# Patient Record
Sex: Male | Born: 1950
Health system: Southern US, Community
[De-identification: ages and names within clinical notes are randomized; demographics above are authoritative.]

## PROBLEM LIST (undated history)

## (undated) DIAGNOSIS — L111 Transient acantholytic dermatosis [Grover]: Secondary | ICD-10-CM

## (undated) DIAGNOSIS — I2510 Atherosclerotic heart disease of native coronary artery without angina pectoris: Secondary | ICD-10-CM

## (undated) DIAGNOSIS — J302 Other seasonal allergic rhinitis: Secondary | ICD-10-CM

## (undated) DIAGNOSIS — I639 Cerebral infarction, unspecified: Secondary | ICD-10-CM

## (undated) DIAGNOSIS — E559 Vitamin D deficiency, unspecified: Secondary | ICD-10-CM

## (undated) DIAGNOSIS — I10 Essential (primary) hypertension: Secondary | ICD-10-CM

## (undated) DIAGNOSIS — I2119 ST elevation (STEMI) myocardial infarction involving other coronary artery of inferior wall: Secondary | ICD-10-CM

## (undated) DIAGNOSIS — D751 Secondary polycythemia: Secondary | ICD-10-CM

## (undated) DIAGNOSIS — Z21 Asymptomatic human immunodeficiency virus [HIV] infection status: Secondary | ICD-10-CM

## (undated) DIAGNOSIS — B20 Human immunodeficiency virus [HIV] disease: Secondary | ICD-10-CM

## (undated) DIAGNOSIS — N189 Chronic kidney disease, unspecified: Secondary | ICD-10-CM

## (undated) DIAGNOSIS — J449 Chronic obstructive pulmonary disease, unspecified: Secondary | ICD-10-CM

## (undated) HISTORY — DX: Chronic obstructive pulmonary disease, unspecified: J44.9

## (undated) HISTORY — DX: Essential (primary) hypertension: I10

## (undated) HISTORY — DX: Vitamin D deficiency, unspecified: E55.9

## (undated) HISTORY — DX: Atherosclerotic heart disease of native coronary artery without angina pectoris: I25.10

## (undated) HISTORY — DX: ST elevation (STEMI) myocardial infarction involving other coronary artery of inferior wall: I21.19

---

## 2015-02-21 DIAGNOSIS — J302 Other seasonal allergic rhinitis: Secondary | ICD-10-CM

## 2015-02-21 DIAGNOSIS — L111 Transient acantholytic dermatosis [Grover]: Secondary | ICD-10-CM

## 2015-02-21 DIAGNOSIS — D751 Secondary polycythemia: Secondary | ICD-10-CM

## 2015-02-21 HISTORY — DX: Transient acantholytic dermatosis (grover): L11.1

## 2015-02-21 HISTORY — DX: Secondary polycythemia: D75.1

## 2015-02-21 HISTORY — DX: Other seasonal allergic rhinitis: J30.2

## 2015-04-19 DIAGNOSIS — R300 Dysuria: Secondary | ICD-10-CM | POA: Diagnosis not present

## 2015-04-19 DIAGNOSIS — B20 Human immunodeficiency virus [HIV] disease: Secondary | ICD-10-CM | POA: Diagnosis not present

## 2015-04-23 DIAGNOSIS — B20 Human immunodeficiency virus [HIV] disease: Secondary | ICD-10-CM | POA: Diagnosis not present

## 2015-05-04 DIAGNOSIS — D751 Secondary polycythemia: Secondary | ICD-10-CM | POA: Diagnosis not present

## 2015-06-02 DIAGNOSIS — D485 Neoplasm of uncertain behavior of skin: Secondary | ICD-10-CM | POA: Diagnosis not present

## 2015-06-02 DIAGNOSIS — D692 Other nonthrombocytopenic purpura: Secondary | ICD-10-CM | POA: Diagnosis not present

## 2015-06-02 DIAGNOSIS — L820 Inflamed seborrheic keratosis: Secondary | ICD-10-CM | POA: Diagnosis not present

## 2015-06-02 DIAGNOSIS — L578 Other skin changes due to chronic exposure to nonionizing radiation: Secondary | ICD-10-CM | POA: Diagnosis not present

## 2015-06-02 DIAGNOSIS — L821 Other seborrheic keratosis: Secondary | ICD-10-CM | POA: Diagnosis not present

## 2015-06-08 DIAGNOSIS — L821 Other seborrheic keratosis: Secondary | ICD-10-CM | POA: Diagnosis not present

## 2015-06-08 DIAGNOSIS — G894 Chronic pain syndrome: Secondary | ICD-10-CM | POA: Diagnosis not present

## 2015-06-08 DIAGNOSIS — B20 Human immunodeficiency virus [HIV] disease: Secondary | ICD-10-CM | POA: Diagnosis not present

## 2015-06-08 DIAGNOSIS — J449 Chronic obstructive pulmonary disease, unspecified: Secondary | ICD-10-CM | POA: Diagnosis not present

## 2015-06-08 DIAGNOSIS — H269 Unspecified cataract: Secondary | ICD-10-CM | POA: Diagnosis not present

## 2015-06-08 DIAGNOSIS — E079 Disorder of thyroid, unspecified: Secondary | ICD-10-CM | POA: Diagnosis not present

## 2015-06-28 DIAGNOSIS — H2513 Age-related nuclear cataract, bilateral: Secondary | ICD-10-CM | POA: Diagnosis not present

## 2015-06-28 DIAGNOSIS — H52203 Unspecified astigmatism, bilateral: Secondary | ICD-10-CM | POA: Diagnosis not present

## 2015-06-28 DIAGNOSIS — B20 Human immunodeficiency virus [HIV] disease: Secondary | ICD-10-CM | POA: Diagnosis not present

## 2015-06-28 DIAGNOSIS — H2511 Age-related nuclear cataract, right eye: Secondary | ICD-10-CM | POA: Diagnosis not present

## 2015-06-28 DIAGNOSIS — H5213 Myopia, bilateral: Secondary | ICD-10-CM | POA: Diagnosis not present

## 2015-06-28 DIAGNOSIS — H16002 Unspecified corneal ulcer, left eye: Secondary | ICD-10-CM | POA: Diagnosis not present

## 2015-06-28 DIAGNOSIS — H2512 Age-related nuclear cataract, left eye: Secondary | ICD-10-CM | POA: Diagnosis not present

## 2015-06-28 DIAGNOSIS — H524 Presbyopia: Secondary | ICD-10-CM | POA: Diagnosis not present

## 2015-06-28 DIAGNOSIS — H52223 Regular astigmatism, bilateral: Secondary | ICD-10-CM | POA: Diagnosis not present

## 2015-06-28 DIAGNOSIS — H5203 Hypermetropia, bilateral: Secondary | ICD-10-CM | POA: Diagnosis not present

## 2015-07-08 DIAGNOSIS — F17210 Nicotine dependence, cigarettes, uncomplicated: Secondary | ICD-10-CM | POA: Diagnosis not present

## 2015-07-08 DIAGNOSIS — Z7982 Long term (current) use of aspirin: Secondary | ICD-10-CM | POA: Diagnosis not present

## 2015-07-08 DIAGNOSIS — J449 Chronic obstructive pulmonary disease, unspecified: Secondary | ICD-10-CM | POA: Diagnosis not present

## 2015-07-08 DIAGNOSIS — H2512 Age-related nuclear cataract, left eye: Secondary | ICD-10-CM | POA: Diagnosis not present

## 2015-07-08 DIAGNOSIS — B20 Human immunodeficiency virus [HIV] disease: Secondary | ICD-10-CM | POA: Diagnosis not present

## 2015-07-08 DIAGNOSIS — I10 Essential (primary) hypertension: Secondary | ICD-10-CM | POA: Diagnosis not present

## 2015-07-28 DIAGNOSIS — H2511 Age-related nuclear cataract, right eye: Secondary | ICD-10-CM | POA: Diagnosis not present

## 2015-07-28 DIAGNOSIS — F17210 Nicotine dependence, cigarettes, uncomplicated: Secondary | ICD-10-CM | POA: Diagnosis not present

## 2015-07-28 DIAGNOSIS — Z7982 Long term (current) use of aspirin: Secondary | ICD-10-CM | POA: Diagnosis not present

## 2015-07-28 DIAGNOSIS — B20 Human immunodeficiency virus [HIV] disease: Secondary | ICD-10-CM | POA: Diagnosis not present

## 2015-07-28 DIAGNOSIS — I10 Essential (primary) hypertension: Secondary | ICD-10-CM | POA: Diagnosis not present

## 2015-07-28 DIAGNOSIS — J449 Chronic obstructive pulmonary disease, unspecified: Secondary | ICD-10-CM | POA: Diagnosis not present

## 2015-07-28 DIAGNOSIS — Z9842 Cataract extraction status, left eye: Secondary | ICD-10-CM | POA: Diagnosis not present

## 2015-08-30 DIAGNOSIS — B20 Human immunodeficiency virus [HIV] disease: Secondary | ICD-10-CM | POA: Diagnosis not present

## 2015-08-30 DIAGNOSIS — M899 Disorder of bone, unspecified: Secondary | ICD-10-CM | POA: Diagnosis not present

## 2015-09-03 DIAGNOSIS — B20 Human immunodeficiency virus [HIV] disease: Secondary | ICD-10-CM | POA: Diagnosis not present

## 2016-03-17 DIAGNOSIS — R42 Dizziness and giddiness: Secondary | ICD-10-CM | POA: Diagnosis not present

## 2016-03-17 DIAGNOSIS — J449 Chronic obstructive pulmonary disease, unspecified: Secondary | ICD-10-CM | POA: Diagnosis not present

## 2016-03-17 DIAGNOSIS — S6292XA Unspecified fracture of left wrist and hand, initial encounter for closed fracture: Secondary | ICD-10-CM | POA: Diagnosis not present

## 2016-03-17 DIAGNOSIS — Z79899 Other long term (current) drug therapy: Secondary | ICD-10-CM | POA: Diagnosis not present

## 2016-03-17 DIAGNOSIS — S52592A Other fractures of lower end of left radius, initial encounter for closed fracture: Secondary | ICD-10-CM | POA: Diagnosis not present

## 2016-03-17 DIAGNOSIS — R55 Syncope and collapse: Secondary | ICD-10-CM | POA: Diagnosis not present

## 2016-03-17 DIAGNOSIS — I10 Essential (primary) hypertension: Secondary | ICD-10-CM | POA: Diagnosis not present

## 2016-03-17 DIAGNOSIS — Z87891 Personal history of nicotine dependence: Secondary | ICD-10-CM | POA: Diagnosis not present

## 2016-03-17 DIAGNOSIS — N390 Urinary tract infection, site not specified: Secondary | ICD-10-CM | POA: Diagnosis not present

## 2016-03-17 DIAGNOSIS — S52502A Unspecified fracture of the lower end of left radius, initial encounter for closed fracture: Secondary | ICD-10-CM | POA: Diagnosis not present

## 2016-03-17 DIAGNOSIS — S098XXA Other specified injuries of head, initial encounter: Secondary | ICD-10-CM | POA: Diagnosis not present

## 2016-03-24 DIAGNOSIS — M25532 Pain in left wrist: Secondary | ICD-10-CM | POA: Diagnosis not present

## 2016-03-24 DIAGNOSIS — S52532A Colles' fracture of left radius, initial encounter for closed fracture: Secondary | ICD-10-CM | POA: Diagnosis not present

## 2016-04-06 DIAGNOSIS — S52532D Colles' fracture of left radius, subsequent encounter for closed fracture with routine healing: Secondary | ICD-10-CM | POA: Diagnosis not present

## 2016-04-18 DIAGNOSIS — S52532D Colles' fracture of left radius, subsequent encounter for closed fracture with routine healing: Secondary | ICD-10-CM | POA: Diagnosis not present

## 2016-05-16 DIAGNOSIS — S52532D Colles' fracture of left radius, subsequent encounter for closed fracture with routine healing: Secondary | ICD-10-CM | POA: Diagnosis not present

## 2016-05-26 DIAGNOSIS — S52532D Colles' fracture of left radius, subsequent encounter for closed fracture with routine healing: Secondary | ICD-10-CM | POA: Diagnosis not present

## 2016-05-26 DIAGNOSIS — M25532 Pain in left wrist: Secondary | ICD-10-CM | POA: Diagnosis not present

## 2016-05-29 DIAGNOSIS — M25532 Pain in left wrist: Secondary | ICD-10-CM | POA: Diagnosis not present

## 2016-05-29 DIAGNOSIS — S52532D Colles' fracture of left radius, subsequent encounter for closed fracture with routine healing: Secondary | ICD-10-CM | POA: Diagnosis not present

## 2016-06-05 DIAGNOSIS — S52532D Colles' fracture of left radius, subsequent encounter for closed fracture with routine healing: Secondary | ICD-10-CM | POA: Diagnosis not present

## 2016-06-05 DIAGNOSIS — M25532 Pain in left wrist: Secondary | ICD-10-CM | POA: Diagnosis not present

## 2016-06-07 DIAGNOSIS — S52532D Colles' fracture of left radius, subsequent encounter for closed fracture with routine healing: Secondary | ICD-10-CM | POA: Diagnosis not present

## 2016-06-07 DIAGNOSIS — M25532 Pain in left wrist: Secondary | ICD-10-CM | POA: Diagnosis not present

## 2016-06-14 DIAGNOSIS — S52532D Colles' fracture of left radius, subsequent encounter for closed fracture with routine healing: Secondary | ICD-10-CM | POA: Diagnosis not present

## 2016-06-14 DIAGNOSIS — M25532 Pain in left wrist: Secondary | ICD-10-CM | POA: Diagnosis not present

## 2016-07-04 DIAGNOSIS — I10 Essential (primary) hypertension: Secondary | ICD-10-CM | POA: Diagnosis not present

## 2016-07-04 DIAGNOSIS — R600 Localized edema: Secondary | ICD-10-CM | POA: Diagnosis not present

## 2016-07-04 DIAGNOSIS — F329 Major depressive disorder, single episode, unspecified: Secondary | ICD-10-CM | POA: Diagnosis not present

## 2016-10-26 DIAGNOSIS — R300 Dysuria: Secondary | ICD-10-CM | POA: Diagnosis not present

## 2016-10-26 DIAGNOSIS — B20 Human immunodeficiency virus [HIV] disease: Secondary | ICD-10-CM | POA: Diagnosis not present

## 2016-10-27 DIAGNOSIS — B20 Human immunodeficiency virus [HIV] disease: Secondary | ICD-10-CM | POA: Diagnosis not present

## 2016-10-27 DIAGNOSIS — R300 Dysuria: Secondary | ICD-10-CM | POA: Diagnosis not present

## 2016-11-24 DIAGNOSIS — I10 Essential (primary) hypertension: Secondary | ICD-10-CM | POA: Diagnosis not present

## 2016-11-24 DIAGNOSIS — B20 Human immunodeficiency virus [HIV] disease: Secondary | ICD-10-CM | POA: Diagnosis not present

## 2016-11-28 DIAGNOSIS — I10 Essential (primary) hypertension: Secondary | ICD-10-CM | POA: Diagnosis not present

## 2016-11-28 DIAGNOSIS — E039 Hypothyroidism, unspecified: Secondary | ICD-10-CM | POA: Diagnosis not present

## 2016-11-28 DIAGNOSIS — Z125 Encounter for screening for malignant neoplasm of prostate: Secondary | ICD-10-CM | POA: Diagnosis not present

## 2016-11-28 DIAGNOSIS — E785 Hyperlipidemia, unspecified: Secondary | ICD-10-CM | POA: Diagnosis not present

## 2016-11-28 DIAGNOSIS — R5383 Other fatigue: Secondary | ICD-10-CM | POA: Diagnosis not present

## 2016-11-28 DIAGNOSIS — E538 Deficiency of other specified B group vitamins: Secondary | ICD-10-CM | POA: Diagnosis not present

## 2016-12-19 DIAGNOSIS — Z23 Encounter for immunization: Secondary | ICD-10-CM | POA: Diagnosis not present

## 2016-12-27 DIAGNOSIS — R319 Hematuria, unspecified: Secondary | ICD-10-CM | POA: Diagnosis not present

## 2016-12-27 DIAGNOSIS — Z720 Tobacco use: Secondary | ICD-10-CM | POA: Diagnosis not present

## 2016-12-27 DIAGNOSIS — N183 Chronic kidney disease, stage 3 (moderate): Secondary | ICD-10-CM | POA: Diagnosis not present

## 2016-12-27 DIAGNOSIS — R809 Proteinuria, unspecified: Secondary | ICD-10-CM | POA: Diagnosis not present

## 2017-01-23 DIAGNOSIS — E559 Vitamin D deficiency, unspecified: Secondary | ICD-10-CM | POA: Diagnosis not present

## 2017-01-23 DIAGNOSIS — R809 Proteinuria, unspecified: Secondary | ICD-10-CM | POA: Diagnosis not present

## 2017-01-23 DIAGNOSIS — D509 Iron deficiency anemia, unspecified: Secondary | ICD-10-CM | POA: Diagnosis not present

## 2017-01-23 DIAGNOSIS — Z1159 Encounter for screening for other viral diseases: Secondary | ICD-10-CM | POA: Diagnosis not present

## 2017-01-23 DIAGNOSIS — I129 Hypertensive chronic kidney disease with stage 1 through stage 4 chronic kidney disease, or unspecified chronic kidney disease: Secondary | ICD-10-CM | POA: Diagnosis not present

## 2017-01-23 DIAGNOSIS — N183 Chronic kidney disease, stage 3 (moderate): Secondary | ICD-10-CM | POA: Diagnosis not present

## 2017-01-23 DIAGNOSIS — D519 Vitamin B12 deficiency anemia, unspecified: Secondary | ICD-10-CM | POA: Diagnosis not present

## 2017-01-23 DIAGNOSIS — Z79899 Other long term (current) drug therapy: Secondary | ICD-10-CM | POA: Diagnosis not present

## 2017-01-23 DIAGNOSIS — N189 Chronic kidney disease, unspecified: Secondary | ICD-10-CM | POA: Diagnosis not present

## 2017-02-06 DIAGNOSIS — I10 Essential (primary) hypertension: Secondary | ICD-10-CM | POA: Diagnosis not present

## 2017-02-06 DIAGNOSIS — R809 Proteinuria, unspecified: Secondary | ICD-10-CM | POA: Diagnosis not present

## 2017-02-06 DIAGNOSIS — N189 Chronic kidney disease, unspecified: Secondary | ICD-10-CM | POA: Diagnosis not present

## 2017-02-21 ENCOUNTER — Ambulatory Visit (INDEPENDENT_AMBULATORY_CARE_PROVIDER_SITE_OTHER): Payer: Medicare Other | Admitting: Urology

## 2017-02-21 DIAGNOSIS — N401 Enlarged prostate with lower urinary tract symptoms: Secondary | ICD-10-CM | POA: Diagnosis not present

## 2017-02-21 DIAGNOSIS — R972 Elevated prostate specific antigen [PSA]: Secondary | ICD-10-CM | POA: Diagnosis not present

## 2017-05-23 ENCOUNTER — Other Ambulatory Visit: Payer: Self-pay | Admitting: Behavioral Health

## 2017-05-23 ENCOUNTER — Other Ambulatory Visit: Payer: Medicare Other

## 2017-05-23 ENCOUNTER — Ambulatory Visit: Payer: Medicare Other

## 2017-05-23 ENCOUNTER — Other Ambulatory Visit (HOSPITAL_COMMUNITY)
Admission: RE | Admit: 2017-05-23 | Discharge: 2017-05-23 | Disposition: A | Discharge status: Home / Self Care | Payer: Medicare Other | Source: Ambulatory Visit | Attending: Internal Medicine | Admitting: Internal Medicine

## 2017-05-23 DIAGNOSIS — B20 Human immunodeficiency virus [HIV] disease: Secondary | ICD-10-CM

## 2017-05-23 DIAGNOSIS — Z7251 High risk heterosexual behavior: Secondary | ICD-10-CM

## 2017-05-23 DIAGNOSIS — Z113 Encounter for screening for infections with a predominantly sexual mode of transmission: Secondary | ICD-10-CM | POA: Diagnosis not present

## 2017-05-23 DIAGNOSIS — Z79899 Other long term (current) drug therapy: Secondary | ICD-10-CM

## 2017-05-24 LAB — URINALYSIS
Bilirubin Urine: NEGATIVE
Glucose, UA: NEGATIVE
Ketones, ur: NEGATIVE
Nitrite: NEGATIVE
Protein, ur: NEGATIVE
Specific Gravity, Urine: 1.016 (ref 1.001–1.03)
pH: <=5 (ref 5.0–8.0)

## 2017-05-24 LAB — T-HELPER CELL (CD4) - (RCID CLINIC ONLY)
CD4 % Helper T Cell: 35 % (ref 33–55)
CD4 T Cell Abs: 980 /uL (ref 400–2700)

## 2017-05-24 LAB — URINE CYTOLOGY ANCILLARY ONLY
Chlamydia: NEGATIVE
Neisseria Gonorrhea: NEGATIVE

## 2017-05-25 LAB — QUANTIFERON-TB GOLD PLUS
Mitogen-NIL: >10 IU/mL
NIL: 0.03 IU/mL
QuantiFERON-TB Gold Plus: NEGATIVE
TB1-NIL: <0 IU/mL
TB2-NIL: <0 IU/mL

## 2017-05-25 LAB — HIV-1 RNA ULTRAQUANT REFLEX TO GENTYP+
HIV 1 RNA Quant: <20 Copies/mL — ABNORMAL HIGH
HIV-1 RNA Quant, Log: <1.3 Log cps/mL — ABNORMAL HIGH

## 2017-05-30 ENCOUNTER — Ambulatory Visit: Payer: Medicare Other | Admitting: Urology

## 2017-06-04 LAB — CBC WITH DIFFERENTIAL/PLATELET
Basophils Absolute: 93 cells/uL (ref 0–200)
Basophils Relative: 0.9 %
Eosinophils Absolute: 175 cells/uL (ref 15–500)
Eosinophils Relative: 1.7 %
HCT: 57.2 % — ABNORMAL HIGH (ref 38.5–50.0)
Hemoglobin: 20.4 g/dL — ABNORMAL HIGH (ref 13.2–17.1)
Lymphs Abs: 2544 cells/uL (ref 850–3900)
MCH: 34.6 pg — ABNORMAL HIGH (ref 27.0–33.0)
MCHC: 35.7 g/dL (ref 32.0–36.0)
MCV: 97.1 fL (ref 80.0–100.0)
MPV: 10.3 fL (ref 7.5–12.5)
Monocytes Relative: 7.8 %
Neutro Abs: 6685 cells/uL (ref 1500–7800)
Neutrophils Relative %: 64.9 %
Platelets: 220 10*3/uL (ref 140–400)
RBC: 5.89 10*6/uL — ABNORMAL HIGH (ref 4.20–5.80)
RDW: 12.5 % (ref 11.0–15.0)
Total Lymphocyte: 24.7 %
WBC mixed population: 803 cells/uL (ref 200–950)
WBC: 10.3 10*3/uL (ref 3.8–10.8)

## 2017-06-04 LAB — COMPLETE METABOLIC PANEL WITH GFR
AG Ratio: 1.5 (calc) (ref 1.0–2.5)
ALT: 12 U/L (ref 9–46)
AST: 10 U/L (ref 10–35)
Albumin: 4.4 g/dL (ref 3.6–5.1)
Alkaline phosphatase (APISO): 101 U/L (ref 40–115)
BUN/Creatinine Ratio: 20 (calc) (ref 6–22)
BUN: 39 mg/dL — ABNORMAL HIGH (ref 7–25)
CO2: 25 mmol/L (ref 20–32)
Calcium: 9.4 mg/dL (ref 8.6–10.3)
Chloride: 107 mmol/L (ref 98–110)
Creat: 1.99 mg/dL — ABNORMAL HIGH (ref 0.70–1.25)
GFR, Est African American: 39 mL/min/{1.73_m2} — ABNORMAL LOW (ref >=60)
GFR, Est Non African American: 34 mL/min/{1.73_m2} — ABNORMAL LOW (ref >=60)
Globulin: 2.9 g/dL (calc) (ref 1.9–3.7)
Glucose, Bld: 115 mg/dL — ABNORMAL HIGH (ref 65–99)
Potassium: 5 mmol/L (ref 3.5–5.3)
Sodium: 140 mmol/L (ref 135–146)
Total Bilirubin: 2.3 mg/dL — ABNORMAL HIGH (ref 0.2–1.2)
Total Protein: 7.3 g/dL (ref 6.1–8.1)

## 2017-06-04 LAB — RPR: RPR Ser Ql: NONREACTIVE

## 2017-06-04 LAB — LIPID PANEL
Cholesterol: 187 mg/dL (ref <200)
HDL: 48 mg/dL (ref >40)
LDL Cholesterol (Calc): 114 mg/dL (calc) — ABNORMAL HIGH
Non-HDL Cholesterol (Calc): 139 mg/dL (calc) — ABNORMAL HIGH (ref <130)
Total CHOL/HDL Ratio: 3.9 (calc) (ref <5.0)
Triglycerides: 136 mg/dL (ref <150)

## 2017-06-04 LAB — HIV-1/2 AB - DIFFERENTIATION
HIV-1 antibody: POSITIVE — AB
HIV-2 Ab: NEGATIVE

## 2017-06-04 LAB — HEPATITIS B SURFACE ANTIBODY,QUALITATIVE: Hep B S Ab: NONREACTIVE

## 2017-06-04 LAB — HLA B*5701: HLA-B*5701 w/rflx HLA-B High: POSITIVE — AB

## 2017-06-04 LAB — HEPATITIS C ANTIBODY
Hepatitis C Ab: NONREACTIVE
SIGNAL TO CUT-OFF: 0.04 (ref <1.00)

## 2017-06-04 LAB — HEPATITIS B CORE ANTIBODY, TOTAL: Hep B Core Total Ab: REACTIVE — AB

## 2017-06-04 LAB — HEPATITIS B SURFACE ANTIGEN: Hepatitis B Surface Ag: NONREACTIVE

## 2017-06-04 LAB — HIV ANTIBODY (ROUTINE TESTING W REFLEX): HIV 1&2 Ab, 4th Generation: REACTIVE — AB

## 2017-06-04 LAB — HEPATITIS A ANTIBODY, TOTAL: Hepatitis A AB,Total: REACTIVE — AB

## 2017-06-06 DIAGNOSIS — F329 Major depressive disorder, single episode, unspecified: Secondary | ICD-10-CM | POA: Insufficient documentation

## 2017-06-06 DIAGNOSIS — B20 Human immunodeficiency virus [HIV] disease: Secondary | ICD-10-CM | POA: Insufficient documentation

## 2017-06-06 DIAGNOSIS — F32A Depression, unspecified: Secondary | ICD-10-CM | POA: Insufficient documentation

## 2017-06-06 DIAGNOSIS — R7989 Other specified abnormal findings of blood chemistry: Secondary | ICD-10-CM | POA: Insufficient documentation

## 2017-06-06 DIAGNOSIS — N183 Chronic kidney disease, stage 3 unspecified: Secondary | ICD-10-CM | POA: Insufficient documentation

## 2017-06-06 DIAGNOSIS — D751 Secondary polycythemia: Secondary | ICD-10-CM | POA: Insufficient documentation

## 2017-06-07 ENCOUNTER — Ambulatory Visit (INDEPENDENT_AMBULATORY_CARE_PROVIDER_SITE_OTHER): Payer: Medicare Other | Admitting: Internal Medicine

## 2017-06-07 ENCOUNTER — Ambulatory Visit (INDEPENDENT_AMBULATORY_CARE_PROVIDER_SITE_OTHER): Payer: Medicare Other | Admitting: Pharmacist

## 2017-06-07 ENCOUNTER — Other Ambulatory Visit: Payer: Self-pay

## 2017-06-07 ENCOUNTER — Encounter: Payer: Self-pay | Admitting: Internal Medicine

## 2017-06-07 ENCOUNTER — Telehealth: Payer: Self-pay | Admitting: *Deleted

## 2017-06-07 VITALS — BP 190/91 | HR 92 | Temp 97.5°F | Ht 69.0 in | Wt 193.0 lb

## 2017-06-07 DIAGNOSIS — B20 Human immunodeficiency virus [HIV] disease: Secondary | ICD-10-CM

## 2017-06-07 DIAGNOSIS — D751 Secondary polycythemia: Secondary | ICD-10-CM

## 2017-06-07 DIAGNOSIS — L111 Transient acantholytic dermatosis [Grover]: Secondary | ICD-10-CM | POA: Diagnosis not present

## 2017-06-07 DIAGNOSIS — J302 Other seasonal allergic rhinitis: Secondary | ICD-10-CM | POA: Diagnosis not present

## 2017-06-07 DIAGNOSIS — Z8673 Personal history of transient ischemic attack (TIA), and cerebral infarction without residual deficits: Secondary | ICD-10-CM

## 2017-06-07 DIAGNOSIS — F17210 Nicotine dependence, cigarettes, uncomplicated: Secondary | ICD-10-CM

## 2017-06-07 DIAGNOSIS — I10 Essential (primary) hypertension: Secondary | ICD-10-CM | POA: Insufficient documentation

## 2017-06-07 DIAGNOSIS — R7989 Other specified abnormal findings of blood chemistry: Secondary | ICD-10-CM | POA: Diagnosis not present

## 2017-06-07 DIAGNOSIS — Z0000 Encounter for general adult medical examination without abnormal findings: Secondary | ICD-10-CM

## 2017-06-07 DIAGNOSIS — F320 Major depressive disorder, single episode, mild: Secondary | ICD-10-CM | POA: Diagnosis not present

## 2017-06-07 DIAGNOSIS — N182 Chronic kidney disease, stage 2 (mild): Secondary | ICD-10-CM

## 2017-06-07 MED ORDER — BICTEGRAVIR-EMTRICITAB-TENOFOV 50-200-25 MG PO TABS: 1.0000 | ORAL_TABLET | Freq: Every day | ORAL | 11 refills | Status: DC

## 2017-06-07 MED ORDER — OLMESARTAN MEDOXOMIL 40 MG PO TABS: 40.0000 mg | ORAL_TABLET | Freq: Every day | ORAL | 5 refills | Status: DC

## 2017-06-07 MED ORDER — AMLODIPINE BESYLATE 5 MG PO TABS: 5.0000 mg | ORAL_TABLET | Freq: Every day | ORAL | 5 refills | Status: DC

## 2017-06-07 MED ORDER — HYDROCHLOROTHIAZIDE 25 MG PO TABS: 25.0000 mg | ORAL_TABLET | Freq: Every day | ORAL | 5 refills | Status: DC

## 2017-06-07 MED FILL — AMLODIPINE BESYLATE 5 MG TA: 5 | 30 days supply | Qty: 30 | Fill #0

## 2017-06-07 MED FILL — BIKTARVY 50-200-25 MG TABS: 50-200-25 | 30 days supply | Qty: 30 | Fill #0

## 2017-06-07 NOTE — Assessment & Plan Note (Signed)
We will make a referral for primary care.

## 2017-06-07 NOTE — Assessment & Plan Note (Signed)
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

## 2017-06-07 NOTE — Assessment & Plan Note (Signed)
His infection is under excellent, long-term control.  We will simplify and consolidate his antiretroviral therapy to [facility].  He will follow-up with our pharmacist in about 6 weeks and with me after lab work in 6 months.

## 2017-06-07 NOTE — Addendum Note (Signed)
Addended by: Landis Gandy on: 06/07/2017 12:03 PM   Modules accepted: Orders

## 2017-06-07 NOTE — Telephone Encounter (Signed)
Patient thought he had tried to establish primary care at [facility], but missed his appointment. He is asking for assistance to get into primary care. RN called [facility], spoke with [place]. They do not have record of him, but are accepting medicare patients at this time. RN will call patient, have him contact [facility] at [phone]. They will need demographics, insurance information, list of current medications from him. Landis Gandy, RN

## 2017-06-07 NOTE — Progress Notes (Signed)
HPI: Nicholas [name] is a 67 y.o. male here to see Dr. Megan [name] today to transfer care for his HIV disease  Allergies: Allergies  Allergen Reactions  . Penicillins     Vitals: Temp: 97.5 F (36.4 C) (04/18 0851) Temp Source: Oral (04/18 0851) BP: 190/91 (04/18 0851) Pulse Rate: 92 (04/18 0851)  Past Medical History: No past medical history on file.  Social History: Social History   Socioeconomic History  . Marital status: Single    Spouse name: Not on file  . Number of children: Not on file  . Years of education: Not on file  . Highest education level: Not on file  Occupational History  . Not on file  Social Needs  . Financial resource strain: Not on file  . Food insecurity:    Worry: Not on file    Inability: Not on file  . Transportation needs:    Medical: Not on file    Non-medical: Not on file  Tobacco Use  . Smoking status: Not on file  Substance and Sexual Activity  . Alcohol use: Not on file  . Drug use: Not on file  . Sexual activity: Not on file  Lifestyle  . Physical activity:    Days per week: Not on file    Minutes per session: Not on file  . Stress: Not on file  Relationships  . Social connections:    Talks on phone: Not on file    Gets together: Not on file    Attends religious service: Not on file    Active member of club or organization: Not on file    Attends meetings of clubs or organizations: Not on file    Relationship status: Not on file  Other Topics Concern  . Not on file  Social History Narrative  . Not on file    Previous Regimen: Stribild (2015)  Current Regimen: Dolutegravir + atazanavir + ritonavir  Labs: HIV 1 RNA Quant (Copies/mL)  Date Value  05/23/2017 <20 (H)   CD4 T Cell Abs (/uL)  Date Value  05/23/2017 980   Hep B S Ab (no units)  Date Value  05/23/2017 NON-REACTIVE   Hepatitis B Surface Ag (no units)  Date Value  05/23/2017 NON-REACTIVE    CrCl: Estimated Creatinine Clearance: 39.4 mL/min (A) (by  C-G formula based on SCr of 1.99 mg/dL (H)).  Lipids:    Component Value Date/Time   CHOL 187 05/23/2017 1415   TRIG 136 05/23/2017 1415   HDL 48 05/23/2017 1415   CHOLHDL 3.9 05/23/2017 1415   LDLCALC 114 (H) 05/23/2017 1415    Assessment: Nicholas [name] moved to [place] from [place], [place] last year and is here to transfer care for HIV at our clinic with Dr. Megan [name]. He has had HIV for 28 years, and he does not recall ever having issues with medications not working. He did have to switch to his current 3-tablet regimen from [place] in 2016 due to renal dysfunction. His viral load is suppressed on his current regimen, but he does report diarrhea that he attributes to [name]. He does not miss doses and takes meds first thing in the morning. Of note his blood pressure was elevated today in clinic at 190/91, which he says his BP is usually high.  He currently gets his medications from a mail order pharmacy called [name] that he believes is located in [name]. We will switch to [name] today and transfer his medications to [name] to mail to him. He reports  he finished his last ART pills this morning.  Recommendations: Discontinue dolutegravir + atazanavir + ritonavir Start Biktarvy 1 PO daily Combine Benicar + HCTZ into 1 tablet and add amlodipine 5 mg daily Transfer medications to [name] to mail to patient Follow up with pharmacy clinic on 07/19/17   [name], PharmD PGY1 Pharmacy Resident [name] for Infectious Disease 06/07/17 10:15 AM

## 2017-06-07 NOTE — Assessment & Plan Note (Signed)
He quit smoking for 6 months last year with the help of nicotine patches.  He is interested in quitting again.  I talked to him about a plan to quit and asked him to set a quit date.

## 2017-06-07 NOTE — Assessment & Plan Note (Signed)
His depression is in remission.  He used to be on an antidepressant but found that it gave him a hangover like side effects and stopped it.  He has not felt depressed or anxious in several years.

## 2017-06-07 NOTE — Progress Notes (Signed)
Patient Active Problem List   Diagnosis Date Noted  . HIV disease ([place]) 06/06/2017    Priority: High  . Cigarette smoker 06/07/2017  . HTN (hypertension) 06/07/2017  . History of CVA (cerebrovascular accident) 06/07/2017  . Seasonal allergies 06/07/2017  . Grover's disease 06/07/2017  . Depression 06/06/2017  . CKD (chronic kidney disease) 06/06/2017  . Polycythemia 06/06/2017    Patient's Medications  New Prescriptions   BICTEGRAVIR-EMTRICITABINE-TENOFOVIR AF (BIKTARVY) 50-200-25 MG TABS TABLET    Take 1 tablet by mouth daily.  Previous Medications   ASPIRIN EC 81 MG TABLET    Take 81 mg by mouth daily.  Modified Medications   Modified Medication Previous Medication   HYDROCHLOROTHIAZIDE (HYDRODIURIL) 25 MG TABLET hydrochlorothiazide (HYDRODIURIL) 25 MG tablet      Take 1 tablet (25 mg total) by mouth daily.    Take 25 mg by mouth daily.   OLMESARTAN (BENICAR) 40 MG TABLET olmesartan (BENICAR) 40 MG tablet      Take 1 tablet (40 mg total) by mouth daily.    Take 40 mg by mouth daily.  Discontinued Medications   AMLODIPINE (NORVASC) 5 MG TABLET    Take 5 mg by mouth daily.   ATAZANAVIR (REYATAZ) 300 MG CAPSULE    Take 300 mg by mouth daily.   RITONAVIR (NORVIR) 100 MG TABS TABLET    Take 100 mg by mouth daily.   TAMSULOSIN (FLOMAX) 0.4 MG CAPS CAPSULE    Take 0.4 mg by mouth at bedtime.   TIVICAY 50 MG TABLET    Take 50 mg by mouth daily.    Subjective: Nicholas [name] is in for his initial visit to establish ongoing care of his HIV infection.  He was diagnosed 28 years ago when his male partner was diagnosed with HIV infection.  He did not start on therapy until about 6 years after diagnosis.  He does not believe that he is ever had any complications related to his infection.  He is not sure what medications he was on initially.  He does not recall ever having to stop medications because of side effects and he is never been told that any of his medicines stopped working or that  his virus had developed resistance.  He was on Stribild in 2015 but developed chronic renal insufficiency and was switched to Tivicay, Reyataz and Norvir in 2016.  He has had some diarrhea that he thinks may be related to Reyataz but otherwise tolerates them well.  He never misses doses.  He takes them first thing in the morning.  Her died several years ago of liver failure.  He is currently not in a relationship and not sexually active.  He moved to [place], [place] to live with his sister last summer.  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  He has not been able to find a primary care provider yet.  His only recent health concern has been a slightly pruritic rash on his chest and back.  It gets worse when he takes a hot shower.  Review of Systems: Review of Systems  Constitutional: Negative for chills, diaphoresis, fever, malaise/fatigue and weight loss.  HENT: Positive for congestion. Negative for sore throat.   Respiratory: Positive for cough. Negative for sputum production and shortness of breath.   Cardiovascular: Negative for chest pain.  Gastrointestinal: Positive for diarrhea. Negative for abdominal pain, heartburn, nausea and vomiting.  Genitourinary: Negative for  dysuria and frequency.  Musculoskeletal: Negative for joint pain and myalgias.  Skin: Positive for itching and rash.  Neurological: Negative for dizziness and headaches.  Psychiatric/Behavioral: Negative for depression. The patient is not nervous/anxious.     No past medical history on file.  Social History   Tobacco Use  . Smoking status: Not on file  Substance Use Topics  . Alcohol use: Not on file  . Drug use: Not on file    No family history on file.  Allergies  Allergen Reactions  . Penicillins     Health Maintenance  Topic Date Due  . TETANUS/TDAP  04/30/1969  . COLONOSCOPY  04/30/2000  . PNA vac Low Risk Adult (1 of 2 - PCV13) 05/01/2015  . INFLUENZA VACCINE   09/20/2017  . Hepatitis C Screening  Completed    Objective:  Vitals:   06/07/17 0851  BP: (!) 190/91  Pulse: 92  Temp: (!) 97.5 F (36.4 C)  TempSrc: Oral  Weight: 193 lb (87.5 kg)  Height: 5\' 9"  (1.753 m)   Body mass index is 28.5 kg/m.  Physical Exam  Constitutional: He is oriented to person, place, and time.  He is very pleasant and in good spirits.  HENT:  Mouth/Throat: No oropharyngeal exudate.  Full dentures.  Eyes: Conjunctivae are normal.  Cardiovascular: Normal rate and regular rhythm.  No murmur heard. Pulmonary/Chest: Effort normal and breath sounds normal.  Abdominal: Soft. He exhibits no mass. There is no tenderness.  Musculoskeletal: Normal range of motion.  Neurological: He is alert and oriented to person, place, and time.  Skin: Rash noted.  Maculopapular rash over chest and back.    Lab Results Lab Results  Component Value Date   WBC 10.3 05/23/2017   HGB 20.4 (H) 05/23/2017   HCT 57.2 (H) 05/23/2017   MCV 97.1 05/23/2017   PLT 220 05/23/2017    Lab Results  Component Value Date   CREATININE 1.99 (H) 05/23/2017   BUN 39 (H) 05/23/2017   NA 140 05/23/2017   K 5.0 05/23/2017   CL 107 05/23/2017   CO2 25 05/23/2017    Lab Results  Component Value Date   ALT 12 05/23/2017   AST 10 05/23/2017   BILITOT 2.3 (H) 05/23/2017    Lab Results  Component Value Date   CHOL 187 05/23/2017   HDL 48 05/23/2017   LDLCALC 114 (H) 05/23/2017   TRIG 136 05/23/2017   CHOLHDL 3.9 05/23/2017   Lab Results  Component Value Date   LABRPR NON-REACTIVE 05/23/2017   HIV 1 RNA Quant (Copies/mL)  Date Value  05/23/2017 <20 (H)   CD4 T Cell Abs (/uL)  Date Value  05/23/2017 980     Problem List Items Addressed This Visit      High   HIV disease ([place])    His infection is under excellent, long-term control.  We will simplify and consolidate his antiretroviral therapy to [facility].  He will follow-up with our pharmacist in about 6 weeks and with me  after lab work in 6 months.      Relevant Orders   T-helper cell (CD4)- ([facility] clinic only)   HIV 1 RNA quant-no reflex-bld     Unprioritized   Cigarette smoker    He quit smoking for 6 months last year with the help of nicotine patches.  He is interested in quitting again.  I talked to him about a plan to quit and asked him to set a quit date.  CKD (chronic kidney disease)    We will make a referral for primary care.      Depression    His depression is in remission.  He used to be on an antidepressant but found that it gave him a hangover like side effects and stopped it.  He has not felt depressed or anxious in several years.      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      History of CVA (cerebrovascular accident)   HTN (hypertension)   Relevant Medications   aspirin EC 81 MG tablet   RESOLVED: Low testosterone in male   Polycythemia   Seasonal allergies        Nicholas Bickers, MD [facility] for Infectious [place] [address] [phone] pager   [phone] cell 06/07/2017, 9:29 AM

## 2017-06-11 ENCOUNTER — Encounter: Payer: Self-pay | Admitting: Internal Medicine

## 2017-06-15 NOTE — Telephone Encounter (Signed)
Was able to speak with patient today and direct him to [facility] to establish Primary Care.

## 2017-06-21 ENCOUNTER — Other Ambulatory Visit: Payer: Self-pay | Admitting: Pharmacist

## 2017-06-21 MED FILL — OLMESARTAN MEDOXOMIL 40 MG: 40 | 30 days supply | Qty: 30 | Fill #0

## 2017-06-27 DIAGNOSIS — I10 Essential (primary) hypertension: Secondary | ICD-10-CM | POA: Diagnosis not present

## 2017-06-27 DIAGNOSIS — Z299 Encounter for prophylactic measures, unspecified: Secondary | ICD-10-CM | POA: Diagnosis not present

## 2017-06-27 DIAGNOSIS — Z6827 Body mass index (BMI) 27.0-27.9, adult: Secondary | ICD-10-CM | POA: Diagnosis not present

## 2017-06-27 DIAGNOSIS — B20 Human immunodeficiency virus [HIV] disease: Secondary | ICD-10-CM | POA: Diagnosis not present

## 2017-06-27 DIAGNOSIS — F17210 Nicotine dependence, cigarettes, uncomplicated: Secondary | ICD-10-CM | POA: Diagnosis not present

## 2017-06-27 DIAGNOSIS — N400 Enlarged prostate without lower urinary tract symptoms: Secondary | ICD-10-CM | POA: Diagnosis not present

## 2017-07-02 MED FILL — BIKTARVY 50-200-25 MG TABS: 50-200-25 | 30 days supply | Qty: 30 | Fill #1

## 2017-07-09 MED FILL — AMLODIPINE BESYLATE 5 MG TA: 5 | 30 days supply | Qty: 30 | Fill #1

## 2017-07-10 MED FILL — VENTOLIN HFA 90 MCG INHALER: 108 (90 BAS | 25 days supply | Qty: 18 | Fill #0

## 2017-07-10 MED FILL — ANORO ELLIPTA 62.5-25 MCG I: 62.5-25 | 30 days supply | Qty: 60 | Fill #0

## 2017-07-19 ENCOUNTER — Ambulatory Visit: Payer: Medicare Other

## 2017-07-23 MED FILL — OLMESARTAN MEDOXOMIL 40 MG: 40 | 30 days supply | Qty: 30 | Fill #1

## 2017-07-30 ENCOUNTER — Telehealth: Payer: Self-pay | Admitting: *Deleted

## 2017-07-30 NOTE — Telephone Encounter (Signed)
Patient returning [facility] call. Please call him back if still needed. Landis Gandy, RN

## 2017-08-03 ENCOUNTER — Telehealth: Payer: Self-pay

## 2017-08-09 MED FILL — AMLODIPINE BESYLATE 5 MG TA: 5 | 30 days supply | Qty: 30 | Fill #2

## 2017-08-09 MED FILL — ANORO ELLIPTA 62.5-25 MCG I: 62.5-25 | 30 days supply | Qty: 60 | Fill #1

## 2017-08-09 MED FILL — BIKTARVY 50-200-25 MG TABS: 50-200-25 | 30 days supply | Qty: 30 | Fill #2

## 2017-08-09 MED FILL — VENTOLIN HFA 90 MCG INHALER: 108 (90 BAS | 25 days supply | Qty: 18 | Fill #1

## 2017-08-10 MED FILL — HYDROCHLOROTHIAZIDE 25 MG T: 25 | 30 days supply | Qty: 30 | Fill #0

## 2017-08-22 MED FILL — OLMESARTAN MEDOXOMIL 40 MG: 40 | 30 days supply | Qty: 30 | Fill #2

## 2017-08-27 ENCOUNTER — Telehealth: Payer: Self-pay

## 2017-08-27 NOTE — Telephone Encounter (Signed)
PT called today with questions regarding AVS, pt stated that in the problem list  his he noticed Polycythemia was listed. PT was wondering if he would need a referral to be seen somewhere and if so would it be possible to get care in [place], [place]. Will message to MD to see what he would like to do for the pt. [name]

## 2017-08-27 NOTE — Telephone Encounter (Signed)
Message on voice mail to call patient.

## 2017-08-28 NOTE — Telephone Encounter (Signed)
Please let him know that my plan is to repeat his blood work before his next visit and then make a decision about whether further evaluation is necessary.

## 2017-08-28 NOTE — Telephone Encounter (Signed)
Called pt per Dr. Megan Salon to inform him that repeat blood work is needed before referral for pt's Polycythemia. Pt would like to know if it would be possible to schedule an earlier lab appt instead of lab appt scheduled for 11/26/17. Will route message to MD to see if it would be okay to schedule an earlier lab appt and if so if labs could be ordered before appt. [place]

## 2017-09-03 MED FILL — BIKTARVY 50-200-25 MG TABS: 50-200-25 | 30 days supply | Qty: 30 | Fill #3

## 2017-09-07 MED FILL — AMLODIPINE BESYLATE 5 MG TA: 5 | 30 days supply | Qty: 30 | Fill #3

## 2017-09-07 MED FILL — HYDROCHLOROTHIAZIDE 25 MG T: 25 | 30 days supply | Qty: 30 | Fill #1

## 2017-09-19 MED FILL — OLMESARTAN MEDOXOMIL 40 MG: 40 | 30 days supply | Qty: 30 | Fill #3

## 2017-10-01 MED FILL — AMLODIPINE BESYLATE 5 MG TA: 5 | 30 days supply | Qty: 30 | Fill #4

## 2017-10-01 MED FILL — BIKTARVY 50-200-25 MG TABS: 50-200-25 | 30 days supply | Qty: 30 | Fill #4

## 2017-10-01 MED FILL — HYDROCHLOROTHIAZIDE 25 MG T: 25 | 30 days supply | Qty: 30 | Fill #2

## 2017-10-18 MED FILL — ANORO ELLIPTA 62.5-25 MCG I: 62.5-25 | 30 days supply | Qty: 60 | Fill #2

## 2017-10-18 MED FILL — OLMESARTAN MEDOXOMIL 40 MG: 40 | 30 days supply | Qty: 30 | Fill #4

## 2017-10-18 MED FILL — VENTOLIN HFA 90 MCG INHALER: 108 (90 BAS | 25 days supply | Qty: 18 | Fill #2

## 2017-10-25 MED FILL — AMLODIPINE BESYLATE 5 MG TA: 5 | 30 days supply | Qty: 30 | Fill #5

## 2017-10-25 MED FILL — BIKTARVY 50-200-25 MG TABS: 50-200-25 | 30 days supply | Qty: 30 | Fill #5

## 2017-10-25 MED FILL — HYDROCHLOROTHIAZIDE 25 MG T: 25 | 30 days supply | Qty: 30 | Fill #3

## 2017-11-04 DIAGNOSIS — M109 Gout, unspecified: Secondary | ICD-10-CM | POA: Diagnosis not present

## 2017-11-04 DIAGNOSIS — M79671 Pain in right foot: Secondary | ICD-10-CM | POA: Diagnosis not present

## 2017-11-14 DIAGNOSIS — Z23 Encounter for immunization: Secondary | ICD-10-CM | POA: Diagnosis not present

## 2017-11-23 MED FILL — OLMESARTAN MEDOXOMIL 40 MG: 40 | 30 days supply | Qty: 30 | Fill #5

## 2017-11-23 MED FILL — HYDROCHLOROTHIAZIDE 25 MG T: 25 | 30 days supply | Qty: 30 | Fill #4

## 2017-11-26 ENCOUNTER — Other Ambulatory Visit: Payer: Medicare HMO

## 2017-11-26 DIAGNOSIS — B20 Human immunodeficiency virus [HIV] disease: Secondary | ICD-10-CM

## 2017-11-27 DIAGNOSIS — Z125 Encounter for screening for malignant neoplasm of prostate: Secondary | ICD-10-CM | POA: Diagnosis not present

## 2017-11-27 DIAGNOSIS — G459 Transient cerebral ischemic attack, unspecified: Secondary | ICD-10-CM | POA: Diagnosis not present

## 2017-11-27 DIAGNOSIS — I10 Essential (primary) hypertension: Secondary | ICD-10-CM | POA: Diagnosis not present

## 2017-11-27 DIAGNOSIS — G622 Polyneuropathy due to other toxic agents: Secondary | ICD-10-CM | POA: Diagnosis not present

## 2017-11-27 DIAGNOSIS — J440 Chronic obstructive pulmonary disease with acute lower respiratory infection: Secondary | ICD-10-CM | POA: Diagnosis not present

## 2017-11-27 DIAGNOSIS — R6889 Other general symptoms and signs: Secondary | ICD-10-CM | POA: Diagnosis not present

## 2017-11-27 DIAGNOSIS — Z6826 Body mass index (BMI) 26.0-26.9, adult: Secondary | ICD-10-CM | POA: Diagnosis not present

## 2017-11-27 DIAGNOSIS — M10262 Drug-induced gout, left knee: Secondary | ICD-10-CM | POA: Diagnosis not present

## 2017-11-27 DIAGNOSIS — B20 Human immunodeficiency virus [HIV] disease: Secondary | ICD-10-CM | POA: Diagnosis not present

## 2017-11-28 LAB — HIV-1 RNA QUANT-NO REFLEX-BLD
HIV 1 RNA Quant: <20 copies/mL
HIV-1 RNA Quant, Log: <1.3 Log copies/mL

## 2017-11-28 LAB — T-HELPER CELL (CD4) - (RCID CLINIC ONLY)
CD4 % Helper T Cell: 39 % (ref 33–55)
CD4 T Cell Abs: 980 /uL (ref 400–2700)

## 2017-12-04 ENCOUNTER — Ambulatory Visit (INDEPENDENT_AMBULATORY_CARE_PROVIDER_SITE_OTHER): Payer: Medicare HMO | Admitting: Internal Medicine

## 2017-12-04 ENCOUNTER — Encounter: Payer: Self-pay | Admitting: Internal Medicine

## 2017-12-04 DIAGNOSIS — F3342 Major depressive disorder, recurrent, in full remission: Secondary | ICD-10-CM

## 2017-12-04 DIAGNOSIS — D751 Secondary polycythemia: Secondary | ICD-10-CM | POA: Diagnosis not present

## 2017-12-04 DIAGNOSIS — E785 Hyperlipidemia, unspecified: Secondary | ICD-10-CM | POA: Diagnosis not present

## 2017-12-04 DIAGNOSIS — B20 Human immunodeficiency virus [HIV] disease: Secondary | ICD-10-CM | POA: Diagnosis not present

## 2017-12-04 DIAGNOSIS — F17210 Nicotine dependence, cigarettes, uncomplicated: Secondary | ICD-10-CM | POA: Diagnosis not present

## 2017-12-04 NOTE — Assessment & Plan Note (Signed)
He has quit several times in the past using nicotine patches.  I encouraged him to consider trying to quit again.

## 2017-12-04 NOTE — Assessment & Plan Note (Signed)
His infection is under excellent, long-term control.  He is feeling better on Biktarvy and no longer has chronic diarrhea.  He will follow-up after lab work in 6 months.

## 2017-12-04 NOTE — Progress Notes (Signed)
Patient Active Problem List   Diagnosis Date Noted  . HIV disease ([place]) 06/06/2017    Priority: High  . Dyslipidemia 12/04/2017  . Cigarette smoker 06/07/2017  . HTN (hypertension) 06/07/2017  . History of CVA (cerebrovascular accident) 06/07/2017  . Seasonal allergies 06/07/2017  . Grover's disease 06/07/2017  . Depression 06/06/2017  . CKD (chronic kidney disease) 06/06/2017  . Polycythemia 06/06/2017    Patient's Medications  New Prescriptions   No medications on file  Previous Medications   AMLODIPINE (NORVASC) 5 MG TABLET    Take 1 tablet (5 mg total) by mouth daily.   ASPIRIN EC 81 MG TABLET    Take 81 mg by mouth daily.   BICTEGRAVIR-EMTRICITABINE-TENOFOVIR AF (BIKTARVY) 50-200-25 MG TABS TABLET    Take 1 tablet by mouth daily.   HYDROCHLOROTHIAZIDE (HYDRODIURIL) 25 MG TABLET    Take 1 tablet (25 mg total) by mouth daily.   OLMESARTAN (BENICAR) 40 MG TABLET    Take 1 tablet (40 mg total) by mouth daily.  Modified Medications   No medications on file  Discontinued Medications   No medications on file    Subjective: Nicholas [name] is in for his routine HIV follow-up visit.  At his initial visit here in April we switched him to [place].  He takes it each morning and has no problems tolerating it.  In fact he notes that his chronic diarrhea resolved promptly after switching to [place].  He has only missed 1 dose and that occurred when his pharmacy was late with a refill.  He now has a new primary care doctor and even.  He says that he is a little concerned about his polycythemia and wants to know if that needs to be evaluated.  He is still smoking about 1/2 pack of cigarettes daily.  He is also being treated for hypertension and dyslipidemia.  He is already received his influenza vaccine.  Review of Systems: Review of Systems  Constitutional: Negative for chills, diaphoresis, fever, malaise/fatigue and weight loss.  HENT: Negative for sore throat.   Respiratory:  Positive for cough and shortness of breath. Negative for sputum production.   Cardiovascular: Negative for chest pain.  Gastrointestinal: Negative for abdominal pain, diarrhea, heartburn, nausea and vomiting.  Genitourinary: Negative for dysuria and frequency.  Musculoskeletal: Negative for joint pain and myalgias.  Skin: Negative for rash.  Neurological: Negative for dizziness and headaches.  Psychiatric/Behavioral: Negative for depression and substance abuse. The patient is not nervous/anxious.     No past medical history on file.  Social History   Tobacco Use  . Smoking status: Current Every Day Smoker  . Smokeless tobacco: Never Used  Substance Use Topics  . Alcohol use: Not Currently  . Drug use: Yes    Types: Marijuana    No family history on file.  Allergies  Allergen Reactions  . Penicillins Anaphylaxis    Per patient    Health Maintenance  Topic Date Due  . TETANUS/TDAP  04/30/1969  . COLONOSCOPY  04/30/2000  . PNA vac Low Risk Adult (1 of 2 - PCV13) 05/01/2015  . INFLUENZA VACCINE  09/20/2017  . Hepatitis C Screening  Completed    Objective:  Vitals:   12/04/17 1344  BP: (!) 151/77  Pulse: 98  Temp: 98.1 F (36.7 C)  Weight: 173 lb (78.5 kg)   Body mass index is 25.55 kg/m.  Physical Exam  Constitutional: He is oriented to person, place, and time.  He is in good spirits.  HENT:  Mouth/Throat: No oropharyngeal exudate.  Eyes: Conjunctivae are normal.  Cardiovascular: Normal rate and regular rhythm.  No murmur heard. Pulmonary/Chest: Effort normal and breath sounds normal.  Abdominal: Soft. He exhibits no mass. There is no tenderness.  Musculoskeletal: Normal range of motion.  Neurological: He is alert and oriented to person, place, and time.  Skin: No rash noted.  He has facial flushing.  Psychiatric: He has a normal mood and affect.    Lab Results Lab Results  Component Value Date   WBC 10.3 05/23/2017   HGB 20.4 (H) 05/23/2017    HCT 57.2 (H) 05/23/2017   MCV 97.1 05/23/2017   PLT 220 05/23/2017    Lab Results  Component Value Date   CREATININE 1.99 (H) 05/23/2017   BUN 39 (H) 05/23/2017   NA 140 05/23/2017   K 5.0 05/23/2017   CL 107 05/23/2017   CO2 25 05/23/2017    Lab Results  Component Value Date   ALT 12 05/23/2017   AST 10 05/23/2017   BILITOT 2.3 (H) 05/23/2017    Lab Results  Component Value Date   CHOL 187 05/23/2017   HDL 48 05/23/2017   LDLCALC 114 (H) 05/23/2017   TRIG 136 05/23/2017   CHOLHDL 3.9 05/23/2017   Lab Results  Component Value Date   LABRPR NON-REACTIVE 05/23/2017   HIV 1 RNA Quant  Date Value  11/26/2017 <20 NOT DETECTED copies/mL  05/23/2017 <20 Copies/mL (H)   CD4 T Cell Abs (/uL)  Date Value  11/26/2017 980  05/23/2017 980     Problem List Items Addressed This Visit      High   HIV disease ([place])    His infection is under excellent, long-term control.  He is feeling better on Biktarvy and no longer has chronic diarrhea.  He will follow-up after lab work in 6 months.      Relevant Orders   T-helper cell (CD4)- ([facility] clinic only)   HIV-1 RNA quant-no reflex-bld   CBC   Comprehensive metabolic panel   Lipid panel   RPR     Unprioritized   Polycythemia    He has chronic polycythemia.  I suspect that it is related to cigarette smoking and COPD but I suggested that he talk with his PCP to see if other diagnostic evaluation is warranted.      Dyslipidemia   Depression    His depression is in remission.      Cigarette smoker    He has quit several times in the past using nicotine patches.  I encouraged him to consider trying to quit again.           Nicholas Bickers, MD [facility] for [place] [facility] [phone] pager   [phone] cell 12/04/2017, 2:06 PM

## 2017-12-04 NOTE — Assessment & Plan Note (Signed)
His depression is in remission. 

## 2017-12-04 NOTE — Assessment & Plan Note (Signed)
He has chronic polycythemia.  I suspect that it is related to cigarette smoking and COPD but I suggested that he talk with his PCP to see if other diagnostic evaluation is warranted.

## 2017-12-05 ENCOUNTER — Encounter: Payer: Self-pay | Admitting: Internal Medicine

## 2017-12-05 ENCOUNTER — Other Ambulatory Visit: Payer: Self-pay | Admitting: Pharmacist

## 2017-12-05 DIAGNOSIS — I10 Essential (primary) hypertension: Secondary | ICD-10-CM

## 2017-12-10 ENCOUNTER — Ambulatory Visit: Payer: Medicare Other | Admitting: Internal Medicine

## 2017-12-11 MED FILL — BIKTARVY 50-200-25 MG TABS: 50-200-25 | 30 days supply | Qty: 30 | Fill #6

## 2017-12-11 MED FILL — AMLODIPINE BESYLATE 5 MG TA: 5 | 30 days supply | Qty: 30 | Fill #0

## 2017-12-11 MED FILL — ANORO ELLIPTA 62.5-25 MCG I: 62.5-25 | 30 days supply | Qty: 60 | Fill #3

## 2017-12-28 ENCOUNTER — Other Ambulatory Visit: Payer: Self-pay | Admitting: *Deleted

## 2017-12-28 ENCOUNTER — Other Ambulatory Visit: Payer: Self-pay | Admitting: Pharmacist

## 2017-12-28 DIAGNOSIS — I10 Essential (primary) hypertension: Secondary | ICD-10-CM

## 2017-12-28 MED ORDER — OLMESARTAN MEDOXOMIL 40 MG PO TABS: 40.0000 mg | ORAL_TABLET | Freq: Every day | ORAL | 5 refills | Status: DC

## 2017-12-28 MED ORDER — HYDROCHLOROTHIAZIDE 25 MG PO TABS: 25.0000 mg | ORAL_TABLET | Freq: Every day | ORAL | 5 refills | Status: DC

## 2017-12-31 MED FILL — OLMESARTAN MEDOXOMIL 40 MG: 40 | 30 days supply | Qty: 30 | Fill #0

## 2018-01-07 MED FILL — ANORO ELLIPTA 62.5-25 MCG I: 62.5-25 | 30 days supply | Qty: 60 | Fill #4

## 2018-01-07 MED FILL — HYDROCHLOROTHIAZIDE 25 MG T: 25 | 30 days supply | Qty: 30 | Fill #5

## 2018-01-07 MED FILL — BIKTARVY 50-200-25 MG TABS: 50-200-25 | 30 days supply | Qty: 30 | Fill #7

## 2018-01-07 MED FILL — AMLODIPINE BESYLATE 5 MG TA: 5 | 30 days supply | Qty: 30 | Fill #1

## 2018-01-14 ENCOUNTER — Inpatient Hospital Stay (HOSPITAL_COMMUNITY)
Admission: EM | Admit: 2018-01-14 | Discharge: 2018-01-17 | DRG: 247 | Disposition: A | Discharge status: Home / Self Care | Payer: Medicare HMO | Attending: Internal Medicine | Admitting: Internal Medicine

## 2018-01-14 ENCOUNTER — Encounter (HOSPITAL_COMMUNITY)
Admission: EM | Disposition: A | Discharge status: Home / Self Care | Payer: Self-pay | Source: Home / Self Care | Attending: Internal Medicine

## 2018-01-14 ENCOUNTER — Encounter (HOSPITAL_COMMUNITY): Payer: Self-pay | Admitting: Internal Medicine

## 2018-01-14 DIAGNOSIS — Z7982 Long term (current) use of aspirin: Secondary | ICD-10-CM

## 2018-01-14 DIAGNOSIS — I499 Cardiac arrhythmia, unspecified: Secondary | ICD-10-CM | POA: Diagnosis not present

## 2018-01-14 DIAGNOSIS — N183 Chronic kidney disease, stage 3 unspecified: Secondary | ICD-10-CM | POA: Diagnosis present

## 2018-01-14 DIAGNOSIS — B20 Human immunodeficiency virus [HIV] disease: Secondary | ICD-10-CM | POA: Diagnosis not present

## 2018-01-14 DIAGNOSIS — I25110 Atherosclerotic heart disease of native coronary artery with unstable angina pectoris: Secondary | ICD-10-CM

## 2018-01-14 DIAGNOSIS — N179 Acute kidney failure, unspecified: Secondary | ICD-10-CM | POA: Diagnosis not present

## 2018-01-14 DIAGNOSIS — I213 ST elevation (STEMI) myocardial infarction of unspecified site: Secondary | ICD-10-CM | POA: Diagnosis not present

## 2018-01-14 DIAGNOSIS — Z79899 Other long term (current) drug therapy: Secondary | ICD-10-CM

## 2018-01-14 DIAGNOSIS — Z21 Asymptomatic human immunodeficiency virus [HIV] infection status: Secondary | ICD-10-CM

## 2018-01-14 DIAGNOSIS — F17210 Nicotine dependence, cigarettes, uncomplicated: Secondary | ICD-10-CM | POA: Diagnosis not present

## 2018-01-14 DIAGNOSIS — E785 Hyperlipidemia, unspecified: Secondary | ICD-10-CM | POA: Diagnosis not present

## 2018-01-14 DIAGNOSIS — Z8673 Personal history of transient ischemic attack (TIA), and cerebral infarction without residual deficits: Secondary | ICD-10-CM | POA: Diagnosis not present

## 2018-01-14 DIAGNOSIS — I2121 ST elevation (STEMI) myocardial infarction involving left circumflex coronary artery: Secondary | ICD-10-CM | POA: Diagnosis not present

## 2018-01-14 DIAGNOSIS — I129 Hypertensive chronic kidney disease with stage 1 through stage 4 chronic kidney disease, or unspecified chronic kidney disease: Secondary | ICD-10-CM | POA: Diagnosis present

## 2018-01-14 DIAGNOSIS — Z880 Allergy status to penicillin: Secondary | ICD-10-CM | POA: Diagnosis not present

## 2018-01-14 DIAGNOSIS — R079 Chest pain, unspecified: Secondary | ICD-10-CM | POA: Diagnosis not present

## 2018-01-14 DIAGNOSIS — R0789 Other chest pain: Secondary | ICD-10-CM | POA: Diagnosis not present

## 2018-01-14 DIAGNOSIS — R0602 Shortness of breath: Secondary | ICD-10-CM | POA: Diagnosis not present

## 2018-01-14 DIAGNOSIS — Z955 Presence of coronary angioplasty implant and graft: Secondary | ICD-10-CM

## 2018-01-14 DIAGNOSIS — I2510 Atherosclerotic heart disease of native coronary artery without angina pectoris: Secondary | ICD-10-CM | POA: Diagnosis not present

## 2018-01-14 DIAGNOSIS — I25118 Atherosclerotic heart disease of native coronary artery with other forms of angina pectoris: Secondary | ICD-10-CM | POA: Diagnosis not present

## 2018-01-14 DIAGNOSIS — Z8249 Family history of ischemic heart disease and other diseases of the circulatory system: Secondary | ICD-10-CM | POA: Diagnosis not present

## 2018-01-14 DIAGNOSIS — I2129 ST elevation (STEMI) myocardial infarction involving other sites: Secondary | ICD-10-CM | POA: Diagnosis present

## 2018-01-14 HISTORY — DX: Transient acantholytic dermatosis (grover): L11.1

## 2018-01-14 HISTORY — DX: Other seasonal allergic rhinitis: J30.2

## 2018-01-14 HISTORY — DX: Essential (primary) hypertension: I10

## 2018-01-14 HISTORY — DX: Chronic kidney disease, unspecified: N18.9

## 2018-01-14 HISTORY — DX: Asymptomatic human immunodeficiency virus (hiv) infection status: Z21

## 2018-01-14 HISTORY — PX: LEFT HEART CATH AND CORONARY ANGIOGRAPHY: CATH118249

## 2018-01-14 HISTORY — DX: Human immunodeficiency virus (HIV) disease: B20

## 2018-01-14 HISTORY — DX: Cerebral infarction, unspecified: I63.9

## 2018-01-14 HISTORY — DX: Secondary polycythemia: D75.1

## 2018-01-14 HISTORY — PX: CORONARY/GRAFT ACUTE MI REVASCULARIZATION: CATH118305

## 2018-01-14 LAB — COMPREHENSIVE METABOLIC PANEL
ALT: 13 U/L (ref 0–44)
AST: 12 U/L — ABNORMAL LOW (ref 15–41)
Albumin: 3 g/dL — ABNORMAL LOW (ref 3.5–5.0)
Alkaline Phosphatase: 57 U/L (ref 38–126)
Anion gap: 8 (ref 5–15)
BUN: 22 mg/dL (ref 8–23)
CO2: 19 mmol/L — ABNORMAL LOW (ref 22–32)
Calcium: 7.9 mg/dL — ABNORMAL LOW (ref 8.9–10.3)
Chloride: 111 mmol/L (ref 98–111)
Creatinine, Ser: 1.43 mg/dL — ABNORMAL HIGH (ref 0.61–1.24)
GFR calc Af Amer: 57 mL/min — ABNORMAL LOW (ref >60)
GFR calc non Af Amer: 49 mL/min — ABNORMAL LOW (ref >60)
Glucose, Bld: 180 mg/dL — ABNORMAL HIGH (ref 70–99)
Potassium: 3.6 mmol/L (ref 3.5–5.1)
Sodium: 138 mmol/L (ref 135–145)
Total Bilirubin: 0.6 mg/dL (ref 0.3–1.2)
Total Protein: 5.8 g/dL — ABNORMAL LOW (ref 6.5–8.1)

## 2018-01-14 LAB — CBC
HCT: 50.5 % (ref 39.0–52.0)
Hemoglobin: 16.9 g/dL (ref 13.0–17.0)
MCH: 33.7 pg (ref 26.0–34.0)
MCHC: 33.5 g/dL (ref 30.0–36.0)
MCV: 100.6 fL — ABNORMAL HIGH (ref 80.0–100.0)
Platelets: 206 10*3/uL (ref 150–400)
RBC: 5.02 MIL/uL (ref 4.22–5.81)
RDW: 13.2 % (ref 11.5–15.5)
WBC: 9.2 10*3/uL (ref 4.0–10.5)
nRBC: 0 % (ref 0.0–0.2)

## 2018-01-14 LAB — POCT ACTIVATED CLOTTING TIME
Activated Clotting Time: 224 seconds
Activated Clotting Time: 246 seconds
Activated Clotting Time: 252 seconds

## 2018-01-14 LAB — LIPID PANEL
Cholesterol: 149 mg/dL (ref 0–200)
HDL: 25 mg/dL — ABNORMAL LOW (ref >40)
LDL Cholesterol: 108 mg/dL — ABNORMAL HIGH (ref 0–99)
Total CHOL/HDL Ratio: 6 RATIO
Triglycerides: 81 mg/dL (ref <150)
VLDL: 16 mg/dL (ref 0–40)

## 2018-01-14 LAB — TROPONIN I
Troponin I: 0.1 ng/mL (ref <0.03)
Troponin I: 1.21 ng/mL (ref <0.03)
Troponin I: 10.26 ng/mL (ref <0.03)
Troponin I: 11.23 ng/mL (ref <0.03)

## 2018-01-14 LAB — APTT: aPTT: >200 seconds (ref 24–36)

## 2018-01-14 LAB — PROTIME-INR
INR: 1.31
Prothrombin Time: 16.1 seconds — ABNORMAL HIGH (ref 11.4–15.2)

## 2018-01-14 LAB — POCT I-STAT, CHEM 8
BUN: 23 mg/dL (ref 8–23)
Calcium, Ion: 1.08 mmol/L — ABNORMAL LOW (ref 1.15–1.40)
Chloride: 103 mmol/L (ref 98–111)
Creatinine, Ser: 1.2 mg/dL (ref 0.61–1.24)
Glucose, Bld: 174 mg/dL — ABNORMAL HIGH (ref 70–99)
HCT: 45 % (ref 39.0–52.0)
Hemoglobin: 15.3 g/dL (ref 13.0–17.0)
Potassium: 3.6 mmol/L (ref 3.5–5.1)
Sodium: 137 mmol/L (ref 135–145)
TCO2: 20 mmol/L — ABNORMAL LOW (ref 22–32)

## 2018-01-14 LAB — HEMOGLOBIN A1C
Hgb A1c MFr Bld: 4.8 % (ref 4.8–5.6)
Mean Plasma Glucose: 91.06 mg/dL

## 2018-01-14 LAB — SURGICAL PCR SCREEN
MRSA, PCR: NEGATIVE
Staphylococcus aureus: NEGATIVE

## 2018-01-14 SURGERY — LEFT HEART CATH AND CORONARY ANGIOGRAPHY
Anesthesia: LOCAL

## 2018-01-14 MED ORDER — TIROFIBAN (AGGRASTAT) BOLUS VIA INFUSION
INTRAVENOUS | Status: DC | PRN
  Administered 2018-01-14: 1950 ug via INTRAVENOUS

## 2018-01-14 MED ORDER — VERAPAMIL HCL 2.5 MG/ML IV SOLN
INTRAVENOUS | Status: DC | PRN
  Administered 2018-01-14: 10 mL via INTRA_ARTERIAL

## 2018-01-14 MED ORDER — HEPARIN SODIUM (PORCINE) 1000 UNIT/ML IJ SOLN
INTRAMUSCULAR | Status: AC
  Filled 2018-01-14: qty 1

## 2018-01-14 MED ORDER — ATROPINE SULFATE 1 MG/10ML IJ SOSY
PREFILLED_SYRINGE | INTRAMUSCULAR | Status: AC
  Filled 2018-01-14: qty 10

## 2018-01-14 MED ORDER — NITROGLYCERIN 1 MG/10 ML FOR IR/CATH LAB
INTRA_ARTERIAL | Status: DC | PRN
  Administered 2018-01-14: 200 ug via INTRACORONARY

## 2018-01-14 MED ORDER — NITROGLYCERIN 1 MG/10 ML FOR IR/CATH LAB
INTRA_ARTERIAL | Status: AC
  Filled 2018-01-14: qty 10

## 2018-01-14 MED ORDER — VERAPAMIL HCL 2.5 MG/ML IV SOLN
INTRAVENOUS | Status: AC
  Filled 2018-01-14: qty 2

## 2018-01-14 MED ORDER — HEPARIN (PORCINE) IN NACL 1000-0.9 UT/500ML-% IV SOLN
INTRAVENOUS | Status: AC
  Filled 2018-01-14: qty 1000

## 2018-01-14 MED ORDER — TICAGRELOR 90 MG PO TABS
ORAL_TABLET | ORAL | Status: DC | PRN
  Administered 2018-01-14: 180 mg via ORAL

## 2018-01-14 MED ORDER — LIDOCAINE HCL (PF) 1 % IJ SOLN
INTRAMUSCULAR | Status: DC | PRN
  Administered 2018-01-14: 2 mL

## 2018-01-14 MED ORDER — LABETALOL HCL 5 MG/ML IV SOLN: 10.0000 mg | INTRAVENOUS | Status: AC | PRN

## 2018-01-14 MED ORDER — TICAGRELOR 90 MG PO TABS
90.0000 mg | ORAL_TABLET | Freq: Two times a day (BID) | ORAL | Status: DC
  Administered 2018-01-14 – 2018-01-17 (×5): 90 mg via ORAL
  Filled 2018-01-14 (×5): qty 1

## 2018-01-14 MED ORDER — HEPARIN (PORCINE) IN NACL 1000-0.9 UT/500ML-% IV SOLN
INTRAVENOUS | Status: DC | PRN
  Administered 2018-01-14 (×3): 500 mL

## 2018-01-14 MED ORDER — ATROPINE SULFATE 1 MG/10ML IJ SOSY
PREFILLED_SYRINGE | INTRAMUSCULAR | Status: DC | PRN
  Administered 2018-01-14: 0.5 mg via INTRAVENOUS

## 2018-01-14 MED ORDER — IOHEXOL 350 MG/ML SOLN
INTRAVENOUS | Status: DC | PRN
  Administered 2018-01-14: 225 mL via INTRAVENOUS

## 2018-01-14 MED ORDER — CARVEDILOL 3.125 MG PO TABS
3.1250 mg | ORAL_TABLET | Freq: Two times a day (BID) | ORAL | Status: DC
  Administered 2018-01-14 – 2018-01-17 (×6): 3.125 mg via ORAL
  Filled 2018-01-14 (×6): qty 1

## 2018-01-14 MED ORDER — TIROFIBAN HCL IV 12.5 MG/250 ML
INTRAVENOUS | Status: AC
  Filled 2018-01-14: qty 250

## 2018-01-14 MED ORDER — HEPARIN SODIUM (PORCINE) 1000 UNIT/ML IJ SOLN
INTRAMUSCULAR | Status: DC | PRN
  Administered 2018-01-14: 2000 [IU] via INTRAVENOUS
  Administered 2018-01-14: 7000 [IU] via INTRAVENOUS

## 2018-01-14 MED ORDER — ASPIRIN EC 81 MG PO TBEC
81.0000 mg | DELAYED_RELEASE_TABLET | Freq: Every day | ORAL | Status: DC
  Administered 2018-01-15 – 2018-01-17 (×2): 81 mg via ORAL
  Filled 2018-01-14 (×2): qty 1

## 2018-01-14 MED ORDER — SODIUM CHLORIDE 0.9% FLUSH
3.0000 mL | Freq: Two times a day (BID) | INTRAVENOUS | Status: DC
  Administered 2018-01-14 – 2018-01-16 (×4): 3 mL via INTRAVENOUS

## 2018-01-14 MED ORDER — TICAGRELOR 90 MG PO TABS
ORAL_TABLET | ORAL | Status: AC
  Filled 2018-01-14: qty 2

## 2018-01-14 MED ORDER — ACETAMINOPHEN 325 MG PO TABS
650.0000 mg | ORAL_TABLET | ORAL | Status: DC | PRN
  Administered 2018-01-15 – 2018-01-16 (×2): 650 mg via ORAL
  Filled 2018-01-14 (×2): qty 2

## 2018-01-14 MED ORDER — SODIUM CHLORIDE 0.9 % IV SOLN: 250.0000 mL | INTRAVENOUS | Status: DC | PRN

## 2018-01-14 MED ORDER — SODIUM CHLORIDE 0.9% FLUSH: 3.0000 mL | INTRAVENOUS | Status: DC | PRN

## 2018-01-14 MED ORDER — BICTEGRAVIR-EMTRICITAB-TENOFOV 50-200-25 MG PO TABS
1.0000 | ORAL_TABLET | Freq: Every day | ORAL | Status: DC
  Administered 2018-01-14 – 2018-01-17 (×4): 1 via ORAL
  Filled 2018-01-14 (×4): qty 1

## 2018-01-14 MED ORDER — HEPARIN (PORCINE) IN NACL 1000-0.9 UT/500ML-% IV SOLN
INTRAVENOUS | Status: AC
  Filled 2018-01-14: qty 500

## 2018-01-14 MED ORDER — HEPARIN SODIUM (PORCINE) 5000 UNIT/ML IJ SOLN
5000.0000 [IU] | Freq: Three times a day (TID) | INTRAMUSCULAR | Status: DC
  Administered 2018-01-14 – 2018-01-16 (×5): 5000 [IU] via SUBCUTANEOUS
  Filled 2018-01-14 (×5): qty 1

## 2018-01-14 MED ORDER — SODIUM CHLORIDE 0.9 % IV SOLN: INTRAVENOUS | Status: AC

## 2018-01-14 MED ORDER — TIROFIBAN HCL IN NACL 5-0.9 MG/100ML-% IV SOLN
INTRAVENOUS | Status: DC | PRN
  Administered 2018-01-14: 0.15 ug/kg/min via INTRAVENOUS

## 2018-01-14 MED ORDER — HYDRALAZINE HCL 20 MG/ML IJ SOLN: 5.0000 mg | INTRAMUSCULAR | Status: AC | PRN

## 2018-01-14 MED ORDER — ONDANSETRON HCL 4 MG/2ML IJ SOLN: 4.0000 mg | Freq: Four times a day (QID) | INTRAMUSCULAR | Status: DC | PRN

## 2018-01-14 MED ORDER — LIDOCAINE HCL (PF) 1 % IJ SOLN
INTRAMUSCULAR | Status: AC
  Filled 2018-01-14: qty 30

## 2018-01-14 MED ORDER — SODIUM CHLORIDE 0.9 % IV SOLN
INTRAVENOUS | Status: AC | PRN
  Administered 2018-01-14 (×2): 20 mL/h via INTRAVENOUS

## 2018-01-14 SURGICAL SUPPLY — 25 items
BALLN SAPPHIRE 1.5X10 (BALLOONS) ×2
BALLN SAPPHIRE 2.0X12 (BALLOONS) ×4
BALLN SAPPHIRE ~~LOC~~ 2.0X12 (BALLOONS) ×2 IMPLANT
BALLN SAPPHIRE ~~LOC~~ 2.75X18 (BALLOONS) ×2 IMPLANT
BALLN ~~LOC~~ EMERGE MR 2.5X20 (BALLOONS) ×2
BALLOON SAPPHIRE 1.5X10 (BALLOONS) ×1 IMPLANT
BALLOON SAPPHIRE 2.0X12 (BALLOONS) ×2 IMPLANT
BALLOON ~~LOC~~ EMERGE MR 2.5X20 (BALLOONS) ×1 IMPLANT
CATH INFINITI 5 FR JL3.5 (CATHETERS) ×2 IMPLANT
CATH INFINITI JR4 5F (CATHETERS) ×2 IMPLANT
CATH LAUNCHER 6FR EBU3.5 (CATHETERS) ×2 IMPLANT
DEVICE RAD COMP TR BAND LRG (VASCULAR PRODUCTS) ×2 IMPLANT
GLIDESHEATH SLEND SS 6F .021 (SHEATH) ×2 IMPLANT
GUIDEWIRE INQWIRE 1.5J.035X260 (WIRE) ×1 IMPLANT
INQWIRE 1.5J .035X260CM (WIRE) ×2
KIT ENCORE 26 ADVANTAGE (KITS) ×2 IMPLANT
KIT HEART LEFT (KITS) ×2 IMPLANT
PACK CARDIAC CATHETERIZATION (CUSTOM PROCEDURE TRAY) ×2 IMPLANT
STENT SYNERGY DES 2.25X38 (Permanent Stent) ×2 IMPLANT
STENT SYNERGY DES 2.5X24 (Permanent Stent) ×2 IMPLANT
TRANSDUCER W/STOPCOCK (MISCELLANEOUS) ×2 IMPLANT
TUBING CIL FLEX 10 FLL-RA (TUBING) ×2 IMPLANT
WIRE ASAHI GRAND SLAM 180CM (WIRE) ×2 IMPLANT
WIRE COUGAR XT STRL 190CM (WIRE) ×2 IMPLANT
WIRE RUNTHROUGH .014X180CM (WIRE) ×2 IMPLANT

## 2018-01-14 NOTE — Care Management Note (Addendum)
Case Management Note  Patient Details  Name: Nicholas [name] MRN: [id number] Date of Birth: January 19, 1951  Subjective/Objective:   67 yo male presented with CP; s/p cath with PCI.          Action/Plan: CM following for dispositional needs. Benefits check complete for Brilinta with monthly est copay cost of $7.05. Patient asleep during CM attempt to discuss POC. CM will continue to follow to discuss Brilinta cost, provide copay card and discuss transitional needs.   Expected Discharge Date:  01/17/18               Expected Discharge Plan:  Home/Self Care  In-House Referral:  NA  Discharge planning Services  CM Consult, Medication Assistance(Brilinta benefits check)  Post Acute Care Choice:  NA Choice offered to:  NA  DME Arranged:  N/A DME Agency:  NA  HH Arranged:  NA HH Agency:  NA  Status of Service:  In process, will continue to follow  If discussed at Long Length of Stay Meetings, dates discussed:    Additional Comments: 01/15/18 @ 1218-[name] RNCM-CM spoke to patient/sister to discuss transitional needs. Patient lived at home alone, independent with ADLs, with no DME in use. PCP verified as: Stoney Bang; pharmacy of choice: [facility]. Brilinta benefits check complete, with monthly est cost $7.05 with patient informed and a Brilinta 30-day free card provided. Patient indicated an additional intervention on 01/16/18. CM will continue to follow.  Midge Minium RN, BSN, NCM-BC, ACM-RN [phone] 01/14/2018, 3:35 PM

## 2018-01-14 NOTE — Progress Notes (Signed)
   01/14/18 0700  Clinical Encounter Type  Visited With Patient not available  Visit Type Initial  Referral From Nurse   Chaplain responded to [facility] page, first to ED and then [place]. We did not find family, but chaplain asked to be paged when and if they arrived.

## 2018-01-14 NOTE — H&P (Signed)
Cardiology Admission History and Physical:   Patient ID: Nicholas [name] MRN: [id number]; DOB: [birth year]   Admission date: 01/14/2018  Primary Care Provider: Neale Burly, MD Primary Cardiologist: No primary care provider on file. Primary Electrophysiologist:  None   Chief Complaint:  Chest pain  Patient Profile:   Nicholas [name] is a 67 y.o. male with CVA x 2, HIV, CKD, and polycythemia, presenting via EMS with chest pain and inferior ST elevation.  History of Present Illness:   Nicholas [name] was in his usual state of health until 3:30 AM today, when he awoke with severe substernal chest pain without radiation.  He called his sister, who advised him to call 911.  When EMS arrived, his EKG showed inferior ST segment elevation prompting STEMI alert.  He was given ASA 325 mg, SL NTG x 3, and Zofran without any improvement.  He also noted nausea without emesis.  He was transported directly to [facility] for left heart catheterization and possible PCI.  Upon arrival here, he continues to complain of 9/10 substernal chest pain.  He denies a history of heart disease.   Past Medical History:  Diagnosis Date  . Chronic kidney disease   . HIV (human immunodeficiency virus infection) ([place])   . Stroke (cerebrum) [facility])     History reviewed. No pertinent surgical history.   Medications Prior to Admission: Prior to Admission medications   Medication Sig Start Date Pedram Goodchild Date Taking? Authorizing Provider  amLODipine (NORVASC) 5 MG tablet TAKE 1 TABLET (5 MG TOTAL) BY MOUTH DAILY. 12/06/17   Kuppelweiser, Cassie L, RPH-CPP  aspirin EC 81 MG tablet Take 81 mg by mouth daily.    [provider]  bictegravir-emtricitabine-tenofovir AF (BIKTARVY) 50-200-25 MG TABS tablet Take 1 tablet by mouth daily. 06/07/17   Michel Bickers, MD  hydrochlorothiazide (HYDRODIURIL) 25 MG tablet Take 1 tablet (25 mg total) by mouth daily. 12/28/17   Michel Bickers, MD  olmesartan (BENICAR) 40 MG tablet Take 1  tablet (40 mg total) by mouth daily. 12/28/17   Michel Bickers, MD     Allergies:    Allergies  Allergen Reactions  . Penicillins Anaphylaxis    Per patient    Social History:   Social History   Tobacco Use  . Smoking status: Current Every Day Smoker    Packs/day: 1.00    Types: Cigarettes  . Smokeless tobacco: Never Used  Substance Use Topics  . Alcohol use: Not Currently  . Drug use: Yes    Types: Marijuana     Family History:  The patient's family history includes Heart disease in his mother.    ROS:  Review of Systems  Unable to perform ROS: Acuity of condition     Physical Exam/Data:   Vitals:   01/14/18 0900 01/14/18 0905 01/14/18 0910 01/14/18 0915  BP: 131/74 130/66 131/72   Pulse: 80 87 (!) 0 (!) 0  Resp: 15 (!) 24 (!) 0 (!) 0  SpO2: 99% (!) 0% (!) 0% (!) 0%   No intake or output data in the 24 hours ending 01/14/18 0955 There were no vitals filed for this visit. There is no height or weight on file to calculate BMI.  General:  Well nourished, well developed.  Appears uncomfortable on EMS gurney. HEENT: normal Lymph: no adenopathy Neck: no JVD Endocrine:  No thryomegaly Vascular: No carotid bruits; FA pulses 2+ bilaterally without bruits  Cardiac:  normal S1, S2; RRR; no murmur, rub or gallop. Lungs:  clear to auscultation  bilaterally, no wheezing, rhonchi or rales  Abd: soft, nontender, no hepatomegaly  Ext: no lower extremity edema Musculoskeletal:  No deformities, BUE and BLE strength normal and equal Skin: warm and dry  Neuro:  CNs 2-12 intact, no focal abnormalities noted Psych:  Normal affect    EKG:  The ECG that was done by EMS today was personally reviewed and demonstrates NSR with inferior ST elevation and ST depressions in V1-3.  Relevant CV Studies: None  Laboratory Data:  Chemistry Recent Labs  Lab 01/14/18 0745  NA 138  K 3.6  CL 111  CO2 19*  GLUCOSE 180*  BUN 22  CREATININE 1.43*  CALCIUM 7.9*  GFRNONAA 49*    GFRAA 57*  ANIONGAP 8    Recent Labs  Lab 01/14/18 0745  PROT 5.8*  ALBUMIN 3.0*  AST 12*  ALT 13  ALKPHOS 57  BILITOT 0.6   Hematology Recent Labs  Lab 01/14/18 0745  WBC 9.2  RBC 5.02  HGB 16.9  HCT 50.5  MCV 100.6*  MCH 33.7  MCHC 33.5  RDW 13.2  PLT 206   Cardiac Enzymes Recent Labs  Lab 01/14/18 0745  TROPONINI 0.10*   No results for input(s): TROPIPOC in the last 168 hours.  BNPNo results for input(s): BNP, PROBNP in the last 168 hours.  DDimer No results for input(s): DDIMER in the last 168 hours.  Radiology/Studies:  No results found.  Assessment and Plan:   Inferior/posterior STEMI Patient with acute onset of severe chest pain.  EKG consistent with inferior/posterior STEMI, most likely due to acute plaque rupture.  Proceed with emergent left heart catheterization and possible PCI.  Risks and benefits of thep procedure (particularly elevated risk for CIN in the setting of CKD stage III) d/w the patient; he provided emergent verbal consent.  Obtain echo.  Further recommendations following LHC/PCI.  Hyperlipidemia  Check lipid panel.  High intensity statin therapy.  Hypertension  Start carvedilol 3.125 mg BID, if HR and BP allow post PCI.  HIV  Continue home medications.  CKD stage III  Minimize contrast use.  Gentle post-cath hyderation.   Severity of Illness: The appropriate patient status for this patient is INPATIENT. Inpatient status is judged to be reasonable and necessary in order to provide the required intensity of service to ensure the patient's safety. The patient's presenting symptoms, physical exam findings, and initial radiographic and laboratory data in the context of their chronic comorbidities is felt to place them at high risk for further clinical deterioration. Furthermore, it is not anticipated that the patient will be medically stable for discharge from the hospital within 2 midnights of admission. The following  factors support the patient status of inpatient.   " The patient's presenting symptoms include evere chest pain. " The initial radiographic and laboratory data are worrisome because of EKG demonstrating inferior/posterior STEMI. " The chronic co-morbidities include HIV and CKD stage III.   * I certify that at the point of admission it is my clinical judgment that the patient will require inpatient hospital care spanning beyond 2 midnights from the point of admission due to high intensity of service, high risk for further deterioration and high frequency of surveillance required.*    For questions or updates, please contact [facility] Lopez de Gutierrez Please consult [email or web address] for contact info under    Signed, Nelva Bush, MD  01/14/2018 9:55 AM

## 2018-01-14 NOTE — Brief Op Note (Signed)
BRIEF CARDIAC CATHETERIZATION NOTE  01/14/2018  9:10 AM  PATIENT:  Nicholas [name]  67 y.o. male  PRE-OPERATIVE DIAGNOSIS:  STEMI  POST-OPERATIVE DIAGNOSIS:  STEMI  PROCEDURE:  Procedure(s): LEFT HEART CATH AND CORONARY ANGIOGRAPHY (N/A) CORONARY STENT INTERVENTION (N/A)  SURGEON:  Surgeon(s) and Role:    * Demarkus Remmel, Harrell Gave, MD - Primary  FINDINGS: 1. Inferior/posterior STEMI with thrombotic occlusion of the mid and distal LCx. 2. 90% mid RCA stenosis with TIMI-3 flow. 3. Mild to moderate LAD disease. 4. Mildly elevated LVEDP. 5. Challenging but successful PCI to the mid and distal LCx using overlapping Synergy 2.25 x 38 mm and 2.5 x 24 mm drug-eluting stents with 0% residual stenosis and TIMI-3 flow.  RECOMMENDATIONS: 1. Admit to cardiac ICU. 2. Dual antiplatelet therapy with aspirin and ticagrelor for at least 12 months. 3. Consider staged PCI to 90% mid RCA stenosis, as renal function allows. 4. Gentle post-cath hydration, given chronic kidney disease. 5. Aggressive secondary prevention. 6. Obtain echo to assess LVEF.  Nelva Bush, MD [facility] Pager: [phone]

## 2018-01-14 NOTE — Care Management (Signed)
#    6.   S/W   MYONA  @ [facility]           #  [phone]    TICAGRELOR : NONE FORMULARY  BRILINTA  90 MG BID COVER- YES CO-PAY- $ 7.05 TIER- 3 DRUG PRIOR APPROVAL- NO  PREFERRED PHARMACY :  YES [facility] 90 DAY SUPPLY FOR M/O OR RETAIL $  8.50

## 2018-01-14 NOTE — Progress Notes (Signed)
CRITICAL VALUE ALERT  Critical Value:  Troponin 10.26  Date & Time Notied:  01/14/2018 1550  Provider Notified: text paged Dr. Saunders Revel, returned called immediately  Orders Received/Actions taken: No orders received, patient asymptomatic

## 2018-01-14 NOTE — Care Management (Signed)
Benefits check sent and pending for Brilinta. CM team will continue to follow to discuss monthly est copay and provide Brilinta card.  Wreatha Sturgeon RN, BSN, NCM-BC, ACM-RN [phone]  

## 2018-01-15 ENCOUNTER — Inpatient Hospital Stay (HOSPITAL_COMMUNITY): Payer: Medicare HMO

## 2018-01-15 ENCOUNTER — Encounter (HOSPITAL_COMMUNITY): Payer: Self-pay | Admitting: Internal Medicine

## 2018-01-15 ENCOUNTER — Other Ambulatory Visit: Payer: Self-pay

## 2018-01-15 DIAGNOSIS — R079 Chest pain, unspecified: Secondary | ICD-10-CM

## 2018-01-15 LAB — BASIC METABOLIC PANEL
Anion gap: 8 (ref 5–15)
BUN: 19 mg/dL (ref 8–23)
CO2: 21 mmol/L — ABNORMAL LOW (ref 22–32)
Calcium: 8.5 mg/dL — ABNORMAL LOW (ref 8.9–10.3)
Chloride: 112 mmol/L — ABNORMAL HIGH (ref 98–111)
Creatinine, Ser: 1.31 mg/dL — ABNORMAL HIGH (ref 0.61–1.24)
GFR calc Af Amer: >60 mL/min (ref >60)
GFR calc non Af Amer: 55 mL/min — ABNORMAL LOW (ref >60)
Glucose, Bld: 100 mg/dL — ABNORMAL HIGH (ref 70–99)
Potassium: 3.7 mmol/L (ref 3.5–5.1)
Sodium: 141 mmol/L (ref 135–145)

## 2018-01-15 LAB — CBC
HCT: 50.2 % (ref 39.0–52.0)
Hemoglobin: 17 g/dL (ref 13.0–17.0)
MCH: 34.5 pg — ABNORMAL HIGH (ref 26.0–34.0)
MCHC: 33.9 g/dL (ref 30.0–36.0)
MCV: 101.8 fL — ABNORMAL HIGH (ref 80.0–100.0)
Platelets: 171 10*3/uL (ref 150–400)
RBC: 4.93 MIL/uL (ref 4.22–5.81)
RDW: 13.5 % (ref 11.5–15.5)
WBC: 9.7 10*3/uL (ref 4.0–10.5)
nRBC: 0 % (ref 0.0–0.2)

## 2018-01-15 LAB — ECHOCARDIOGRAM COMPLETE

## 2018-01-15 MED ORDER — ALBUTEROL SULFATE (2.5 MG/3ML) 0.083% IN NEBU: 3.0000 mL | INHALATION_SOLUTION | Freq: Four times a day (QID) | RESPIRATORY_TRACT | Status: DC | PRN

## 2018-01-15 MED ORDER — SODIUM CHLORIDE 0.9 % IV SOLN: 250.0000 mL | INTRAVENOUS | Status: DC | PRN

## 2018-01-15 MED ORDER — ATORVASTATIN CALCIUM 40 MG PO TABS
40.0000 mg | ORAL_TABLET | Freq: Every day | ORAL | Status: DC
  Administered 2018-01-15: 40 mg via ORAL
  Filled 2018-01-15: qty 4

## 2018-01-15 MED ORDER — SODIUM CHLORIDE 0.9% FLUSH: 3.0000 mL | Freq: Two times a day (BID) | INTRAVENOUS | Status: DC

## 2018-01-15 MED ORDER — ASPIRIN 81 MG PO CHEW: 81.0000 mg | CHEWABLE_TABLET | ORAL | Status: DC

## 2018-01-15 MED ORDER — SODIUM CHLORIDE 0.9 % WEIGHT BASED INFUSION: 1.0000 mL/kg/h | INTRAVENOUS | Status: DC

## 2018-01-15 MED ORDER — SODIUM CHLORIDE 0.9 % WEIGHT BASED INFUSION: 3.0000 mL/kg/h | INTRAVENOUS | Status: DC

## 2018-01-15 MED ORDER — SODIUM CHLORIDE 0.9% FLUSH: 3.0000 mL | INTRAVENOUS | Status: DC | PRN

## 2018-01-15 MED ORDER — UMECLIDINIUM-VILANTEROL 62.5-25 MCG/INH IN AEPB
1.0000 | INHALATION_SPRAY | Freq: Every day | RESPIRATORY_TRACT | Status: DC
  Filled 2018-01-15: qty 14

## 2018-01-15 NOTE — Progress Notes (Signed)
  Echocardiogram 2D Echocardiogram has been performed.  Nicholas [name] L Androw 01/15/2018, 8:40 AM

## 2018-01-15 NOTE — Progress Notes (Signed)
Progress Note  Patient Name: Nicholas [name] Date of Encounter: 01/15/2018  Primary Cardiologist: [facility] office  Subjective   No chest pain  Inpatient Medications    Scheduled Meds: . aspirin EC  81 mg Oral Daily  . atorvastatin  40 mg Oral q1800  . bictegravir-emtricitabine-tenofovir AF  1 tablet Oral Daily  . carvedilol  3.125 mg Oral BID WC  . heparin  5,000 Units Subcutaneous Q8H  . sodium chloride flush  3 mL Intravenous Q12H  . ticagrelor  90 mg Oral BID   Continuous Infusions: . sodium chloride     PRN Meds: sodium chloride, acetaminophen, ondansetron (ZOFRAN) IV, sodium chloride flush   Vital Signs    Vitals:   01/15/18 0900 01/15/18 1000 01/15/18 1100 01/15/18 1200  BP:  (!) 125/43 (!) 142/58 (!) 171/81  Pulse: 74 70 63 86  Resp: 15 (!) 21 14 20   Temp:   97.9 F (36.6 C) 98 F (36.7 C)  TempSrc:   Oral Oral  SpO2: 96% 96% 96% 94%    Intake/Output Summary (Last 24 hours) at 01/15/2018 1242 Last data filed at 01/15/2018 1200 Gross per 24 hour  Intake 953.83 ml  Output 1325 ml  Net -371.17 ml   There were no vitals filed for this visit.  Telemetry    NSR - Personally Reviewed  ECG    NSR, inferior ST changes resolving - Personally Reviewed  Physical Exam   GEN: No acute distress.   Neck: No JVD Cardiac: RRR, no murmurs, rubs, or gallops.  Respiratory: Clear to auscultation bilaterally. GI: Soft, nontender, non-distended  MS: No edema; No deformity. Right wrist with mild bruising Neuro:  Nonfocal  Psych: Normal affect   Labs    Chemistry Recent Labs  Lab 01/14/18 0742 01/14/18 0745 01/15/18 0439  NA 137 138 141  K 3.6 3.6 3.7  CL 103 111 112*  CO2  --  19* 21*  GLUCOSE 174* 180* 100*  BUN 23 22 19   CREATININE 1.20 1.43* 1.31*  CALCIUM  --  7.9* 8.5*  PROT  --  5.8*  --   ALBUMIN  --  3.0*  --   AST  --  12*  --   ALT  --  13  --   ALKPHOS  --  57  --   BILITOT  --  0.6  --   GFRNONAA  --  49* 55*  GFRAA  --  57*  >60  ANIONGAP  --  8 8     Hematology Recent Labs  Lab 01/14/18 0742 01/14/18 0745 01/15/18 0439  WBC  --  9.2 9.7  RBC  --  5.02 4.93  HGB 15.3 16.9 17.0  HCT 45.0 50.5 50.2  MCV  --  100.6* 101.8*  MCH  --  33.7 34.5*  MCHC  --  33.5 33.9  RDW  --  13.2 13.5  PLT  --  206 171    Cardiac Enzymes Recent Labs  Lab 01/14/18 0745 01/14/18 0947 01/14/18 1437 01/14/18 2204  TROPONINI 0.10* 1.21* 10.26* 11.23*   No results for input(s): TROPIPOC in the last 168 hours.   BNPNo results for input(s): BNP, PROBNP in the last 168 hours.   DDimer No results for input(s): DDIMER in the last 168 hours.   Radiology    No results found.  Cardiac Studies   EF 55-60%  Patient Profile     67 y.o. male with inferior MI; residual RCA disease  Assessment &  Plan    1) CAD/MI: Doing well.  Plan for PCI RCA tomorrow. Check renal function in AM.  COntinue DAPT along with other secondary prevention.    BP control with medication as well.   For questions or updates, please contact [place] Please consult [email or web address] for contact info under        Signed, Larae Grooms, MD  01/15/2018, 12:42 PM

## 2018-01-15 NOTE — Progress Notes (Addendum)
CARDIAC REHAB PHASE I   Offered to walk with pt, pt states he has been ambulating in the hallways without difficulty. Denies CP or SOB. Pt educated on importance of ASA, Brilinta, statin, and NTG. Pt given MI book and stent card, along with heart healthy diet. Reviewed restrictions and exercise guidelines. Talked with pt about smoking cessation, pt state he wants to quit and has alread contacted 1-800-Quit-Now. Will refer to CRP II [place]. Pt for staged PCI tom.   [id number] Rufina Falco, RN BSN 01/15/2018 2:31 PM

## 2018-01-16 ENCOUNTER — Telehealth: Payer: Self-pay | Admitting: Physician Assistant

## 2018-01-16 ENCOUNTER — Encounter (HOSPITAL_COMMUNITY)
Admission: EM | Disposition: A | Discharge status: Home / Self Care | Payer: Self-pay | Source: Home / Self Care | Attending: Internal Medicine

## 2018-01-16 DIAGNOSIS — I25110 Atherosclerotic heart disease of native coronary artery with unstable angina pectoris: Secondary | ICD-10-CM

## 2018-01-16 DIAGNOSIS — I25118 Atherosclerotic heart disease of native coronary artery with other forms of angina pectoris: Secondary | ICD-10-CM

## 2018-01-16 HISTORY — PX: LEFT HEART CATH: CATH118248

## 2018-01-16 HISTORY — PX: CORONARY STENT INTERVENTION: CATH118234

## 2018-01-16 HISTORY — PX: ULTRASOUND GUIDANCE FOR VASCULAR ACCESS: SHX6516

## 2018-01-16 LAB — POCT ACTIVATED CLOTTING TIME
Activated Clotting Time: 340 s
Activated Clotting Time: 566 seconds
Activated Clotting Time: 389 seconds
Activated Clotting Time: 494 s

## 2018-01-16 LAB — CBC
HCT: 54.1 % — ABNORMAL HIGH (ref 39.0–52.0)
Hemoglobin: 18.6 g/dL — ABNORMAL HIGH (ref 13.0–17.0)
MCH: 34.6 pg — ABNORMAL HIGH (ref 26.0–34.0)
MCHC: 34.4 g/dL (ref 30.0–36.0)
MCV: 100.7 fL — ABNORMAL HIGH (ref 80.0–100.0)
Platelets: 187 10*3/uL (ref 150–400)
RBC: 5.37 MIL/uL (ref 4.22–5.81)
RDW: 12.9 % (ref 11.5–15.5)
WBC: 8.8 10*3/uL (ref 4.0–10.5)
nRBC: 0 % (ref 0.0–0.2)

## 2018-01-16 LAB — BASIC METABOLIC PANEL
Anion gap: 7 (ref 5–15)
BUN: 19 mg/dL (ref 8–23)
CO2: 20 mmol/L — ABNORMAL LOW (ref 22–32)
Calcium: 8.6 mg/dL — ABNORMAL LOW (ref 8.9–10.3)
Chloride: 112 mmol/L — ABNORMAL HIGH (ref 98–111)
Creatinine, Ser: 1.22 mg/dL (ref 0.61–1.24)
GFR calc Af Amer: >60 mL/min (ref >60)
GFR calc non Af Amer: >60 mL/min (ref >60)
Glucose, Bld: 107 mg/dL — ABNORMAL HIGH (ref 70–99)
Potassium: 3.6 mmol/L (ref 3.5–5.1)
Sodium: 139 mmol/L (ref 135–145)

## 2018-01-16 SURGERY — CORONARY STENT INTERVENTION
Anesthesia: LOCAL

## 2018-01-16 MED ORDER — SODIUM CHLORIDE 0.9% FLUSH
3.0000 mL | Freq: Two times a day (BID) | INTRAVENOUS | Status: DC
  Administered 2018-01-16: 3 mL via INTRAVENOUS

## 2018-01-16 MED ORDER — SODIUM CHLORIDE 0.9 % WEIGHT BASED INFUSION
3.0000 mL/kg/h | INTRAVENOUS | Status: DC
  Administered 2018-01-16: 3 mL/kg/h via INTRAVENOUS

## 2018-01-16 MED ORDER — DIAZEPAM 5 MG PO TABS: 5.0000 mg | ORAL_TABLET | ORAL | Status: DC | PRN

## 2018-01-16 MED ORDER — AMLODIPINE BESYLATE 5 MG PO TABS
5.0000 mg | ORAL_TABLET | Freq: Every day | ORAL | Status: DC
  Administered 2018-01-16 – 2018-01-17 (×2): 5 mg via ORAL
  Filled 2018-01-16 (×2): qty 1

## 2018-01-16 MED ORDER — HEPARIN SODIUM (PORCINE) 5000 UNIT/ML IJ SOLN
5000.0000 [IU] | Freq: Three times a day (TID) | INTRAMUSCULAR | Status: DC
  Administered 2018-01-17: 5000 [IU] via SUBCUTANEOUS
  Filled 2018-01-16: qty 1

## 2018-01-16 MED ORDER — HEPARIN (PORCINE) IN NACL 1000-0.9 UT/500ML-% IV SOLN
INTRAVENOUS | Status: DC | PRN
  Administered 2018-01-16 (×2): 500 mL

## 2018-01-16 MED ORDER — MIDAZOLAM HCL 2 MG/2ML IJ SOLN
INTRAMUSCULAR | Status: AC
  Filled 2018-01-16: qty 2

## 2018-01-16 MED ORDER — VERAPAMIL HCL 2.5 MG/ML IV SOLN
INTRAVENOUS | Status: DC | PRN
  Administered 2018-01-16: 10 mL via INTRA_ARTERIAL

## 2018-01-16 MED ORDER — SODIUM CHLORIDE 0.9% FLUSH: 3.0000 mL | INTRAVENOUS | Status: DC | PRN

## 2018-01-16 MED ORDER — SODIUM CHLORIDE 0.9 % IV SOLN: 250.0000 mL | INTRAVENOUS | Status: DC | PRN

## 2018-01-16 MED ORDER — ACETAMINOPHEN 325 MG PO TABS: 650.0000 mg | ORAL_TABLET | ORAL | Status: DC | PRN

## 2018-01-16 MED ORDER — MIDAZOLAM HCL 2 MG/2ML IJ SOLN
INTRAMUSCULAR | Status: DC | PRN
  Administered 2018-01-16: 2 mg
  Administered 2018-01-16: 1 mg

## 2018-01-16 MED ORDER — NITROGLYCERIN 1 MG/10 ML FOR IR/CATH LAB
INTRA_ARTERIAL | Status: DC | PRN
  Administered 2018-01-16 (×3): 200 ug via INTRACORONARY

## 2018-01-16 MED ORDER — LIDOCAINE HCL (PF) 1 % IJ SOLN
INTRAMUSCULAR | Status: DC | PRN
  Administered 2018-01-16: 2 mL

## 2018-01-16 MED ORDER — SODIUM CHLORIDE 0.9 % IV SOLN
INTRAVENOUS | Status: DC
  Administered 2018-01-16: 15:00:00 via INTRAVENOUS

## 2018-01-16 MED ORDER — ATORVASTATIN CALCIUM 40 MG PO TABS
40.0000 mg | ORAL_TABLET | Freq: Every day | ORAL | Status: DC
  Administered 2018-01-16: 40 mg via ORAL
  Filled 2018-01-16: qty 1

## 2018-01-16 MED ORDER — SODIUM CHLORIDE 0.9 % WEIGHT BASED INFUSION
1.0000 mL/kg/h | INTRAVENOUS | Status: DC
  Administered 2018-01-16: 1 mL/kg/h via INTRAVENOUS

## 2018-01-16 MED ORDER — FENTANYL CITRATE (PF) 100 MCG/2ML IJ SOLN
INTRAMUSCULAR | Status: DC | PRN
  Administered 2018-01-16 (×2): 25 ug

## 2018-01-16 MED ORDER — ONDANSETRON HCL 4 MG/2ML IJ SOLN: 4.0000 mg | Freq: Four times a day (QID) | INTRAMUSCULAR | Status: DC | PRN

## 2018-01-16 MED ORDER — HEPARIN SODIUM (PORCINE) 1000 UNIT/ML IJ SOLN
INTRAMUSCULAR | Status: DC | PRN
  Administered 2018-01-16: 2000 [IU] via INTRAVENOUS
  Administered 2018-01-16: 8000 [IU] via INTRAVENOUS
  Administered 2018-01-16: 2000 [IU] via INTRAVENOUS

## 2018-01-16 MED ORDER — TICAGRELOR 90 MG PO TABS
ORAL_TABLET | ORAL | Status: DC | PRN
  Administered 2018-01-16: 90 mg via ORAL

## 2018-01-16 MED ORDER — ASPIRIN 81 MG PO CHEW: 81.0000 mg | CHEWABLE_TABLET | Freq: Every day | ORAL | Status: DC

## 2018-01-16 MED ORDER — IOHEXOL 350 MG/ML SOLN
INTRAVENOUS | Status: DC | PRN
  Administered 2018-01-16: 205 mL via INTRACARDIAC

## 2018-01-16 MED ORDER — ASPIRIN 81 MG PO CHEW
81.0000 mg | CHEWABLE_TABLET | ORAL | Status: AC
  Administered 2018-01-16: 81 mg via ORAL
  Filled 2018-01-16: qty 1

## 2018-01-16 MED ORDER — TICAGRELOR 90 MG PO TABS: 90.0000 mg | ORAL_TABLET | Freq: Two times a day (BID) | ORAL | Status: DC

## 2018-01-16 MED ORDER — HEPARIN SODIUM (PORCINE) 1000 UNIT/ML IJ SOLN
INTRAMUSCULAR | Status: AC
  Filled 2018-01-16: qty 1

## 2018-01-16 MED ORDER — TICAGRELOR 90 MG PO TABS: 90.0000 mg | ORAL_TABLET | Freq: Two times a day (BID) | ORAL | 11 refills | Status: DC

## 2018-01-16 MED ORDER — LIDOCAINE HCL (PF) 1 % IJ SOLN
INTRAMUSCULAR | Status: AC
  Filled 2018-01-16: qty 30

## 2018-01-16 MED ORDER — LABETALOL HCL 5 MG/ML IV SOLN: 10.0000 mg | INTRAVENOUS | Status: AC | PRN

## 2018-01-16 MED ORDER — NITROGLYCERIN 1 MG/10 ML FOR IR/CATH LAB
INTRA_ARTERIAL | Status: AC
  Filled 2018-01-16: qty 10

## 2018-01-16 MED ORDER — FENTANYL CITRATE (PF) 100 MCG/2ML IJ SOLN
INTRAMUSCULAR | Status: AC
  Filled 2018-01-16: qty 2

## 2018-01-16 MED ORDER — SODIUM CHLORIDE 0.9% FLUSH: 3.0000 mL | Freq: Two times a day (BID) | INTRAVENOUS | Status: DC

## 2018-01-16 MED ORDER — VERAPAMIL HCL 2.5 MG/ML IV SOLN
INTRAVENOUS | Status: AC
  Filled 2018-01-16: qty 2

## 2018-01-16 MED ORDER — TICAGRELOR 90 MG PO TABS
ORAL_TABLET | ORAL | Status: AC
  Filled 2018-01-16: qty 1

## 2018-01-16 MED ORDER — HEPARIN (PORCINE) IN NACL 1000-0.9 UT/500ML-% IV SOLN
INTRAVENOUS | Status: AC
  Filled 2018-01-16: qty 1000

## 2018-01-16 MED ORDER — HYDRALAZINE HCL 20 MG/ML IJ SOLN: 5.0000 mg | INTRAMUSCULAR | Status: AC | PRN

## 2018-01-16 MED FILL — BRILINTA 90 MG TABLET: 90 | 30 days supply | Qty: 60 | Fill #0

## 2018-01-16 SURGICAL SUPPLY — 22 items
BALLN SAPPHIRE 2.0X10 (BALLOONS) ×3
BALLN SAPPHIRE 2.5X12 (BALLOONS) ×3
BALLN SAPPHIRE ~~LOC~~ 2.75X12 (BALLOONS) ×3 IMPLANT
BALLOON SAPPHIRE 2.0X10 (BALLOONS) ×2 IMPLANT
BALLOON SAPPHIRE 2.5X12 (BALLOONS) ×2 IMPLANT
CATH VISTA GUIDE 6FR JR4 (CATHETERS) ×3 IMPLANT
CATH VISTA GUIDE 6FR XBRCA SH (CATHETERS) ×3 IMPLANT
DEVICE RAD COMP TR BAND LRG (VASCULAR PRODUCTS) ×3 IMPLANT
ELECT DEFIB PAD ADLT CADENCE (PAD) ×3 IMPLANT
GLIDESHEATH SLEND SS 6F .021 (SHEATH) ×3 IMPLANT
GUIDEWIRE INQWIRE 1.5J.035X260 (WIRE) ×2 IMPLANT
INQWIRE 1.5J .035X260CM (WIRE) ×3
KIT ENCORE 26 ADVANTAGE (KITS) ×3 IMPLANT
KIT HEART LEFT (KITS) ×3 IMPLANT
PACK CARDIAC CATHETERIZATION (CUSTOM PROCEDURE TRAY) ×3 IMPLANT
SHEATH PROBE COVER 6X72 (BAG) ×3 IMPLANT
STENT SIERRA 2.25 X 15 MM (Permanent Stent) ×3 IMPLANT
STENT SIERRA 2.50 X 15 MM (Permanent Stent) ×3 IMPLANT
TRANSDUCER W/STOPCOCK (MISCELLANEOUS) ×3 IMPLANT
TUBING CIL FLEX 10 FLL-RA (TUBING) ×3 IMPLANT
WIRE MINAMO 190 (WIRE) ×3 IMPLANT
WIRE PT2 MS 185 (WIRE) ×3 IMPLANT

## 2018-01-16 NOTE — Telephone Encounter (Signed)
New message    TOC appt scheduled per [facility] for 12.6.19 at 10am with Rosaria Ferries.

## 2018-01-16 NOTE — Progress Notes (Signed)
Less tingling sensation

## 2018-01-16 NOTE — Interval H&P Note (Signed)
Cath Lab Visit (complete for each Cath Lab visit)  Clinical Evaluation Leading to the Procedure:   ACS: Yes.    Non-ACS:    Anginal Classification: CCS IV  Anti-ischemic medical therapy: Minimal Therapy (1 class of medications)  Non-Invasive Test Results: No non-invasive testing performed  Prior CABG: No previous CABG      History and Physical Interval Note:  01/16/2018 10:31 AM  Nicholas [name]  has presented today for surgery, with the diagnosis of Stemi from Yesterday - Staged PCI  The various methods of treatment have been discussed with the patient and family. After consideration of risks, benefits and other options for treatment, the patient has consented to  Procedure(s): CORONARY STENT INTERVENTION (N/A) as a surgical intervention .  The patient's history has been reviewed, patient examined, no change in status, stable for surgery.  I have reviewed the patient's chart and labs.  Questions were answered to the patient's satisfaction.     Shelva Majestic

## 2018-01-16 NOTE — Progress Notes (Signed)
Pt came back from cath lab, TR band filled 18cc due to swelling on his arm, added air 55ml by Cath lab nurse at the cath lab. Ecchymotic due to previous heart cath on same site. Noticed scant bleeding, elevated Rt. Arm above heart level and continue to monitor bleeding. HS [employer]

## 2018-01-16 NOTE — H&P (View-Only) (Signed)
Progress Note  Patient Name: Nicholas [name] Date of Encounter: 01/16/2018  Primary Cardiologist: New to [facility] (will be followed at [facility])  Subjective   Feeling well. No chest pain, sob or palpitations.   Inpatient Medications    Scheduled Meds: . aspirin EC  81 mg Oral Daily  . atorvastatin  40 mg Oral q1800  . bictegravir-emtricitabine-tenofovir AF  1 tablet Oral Daily  . carvedilol  3.125 mg Oral BID WC  . heparin  5,000 Units Subcutaneous Q8H  . sodium chloride flush  3 mL Intravenous Q12H  . sodium chloride flush  3 mL Intravenous Q12H  . ticagrelor  90 mg Oral BID  . umeclidinium-vilanterol  1 puff Inhalation Daily   Continuous Infusions: . sodium chloride    . sodium chloride    . sodium chloride 1 mL/kg/hr (01/16/18 9381)   PRN Meds: sodium chloride, sodium chloride, acetaminophen, albuterol, ondansetron (ZOFRAN) IV, sodium chloride flush, sodium chloride flush   Vital Signs    Vitals:   01/15/18 2257 01/16/18 0358 01/16/18 0732 01/16/18 0836  BP: 120/60 123/70 (!) 103/56   Pulse: 80 75 (!) 57 70  Resp: 16 16 20 15   Temp: 98.4 F (36.9 C) 98.1 F (36.7 C) 97.9 F (36.6 C)   TempSrc: Oral Oral Oral   SpO2: 96% 96% 97% 95%  Weight:      Height:        Intake/Output Summary (Last 24 hours) at 01/16/2018 0901 Last data filed at 01/16/2018 0800 Gross per 24 hour  Intake 973.58 ml  Output 800 ml  Net 173.58 ml   Filed Weights   01/15/18 1300 01/15/18 1949  Weight: 77.6 kg 78.1 kg    Telemetry    NSR - Personally Reviewed  ECG    None today   Physical Exam   GEN: No acute distress.   Neck: No JVD Cardiac: RRR, no murmurs, rubs, or gallops.  Respiratory: Clear to auscultation bilaterally. GI: Soft, nontender, non-distended  MS: No edema; No deformity. Neuro:  Nonfocal  Psych: Normal affect   Labs    Chemistry Recent Labs  Lab 01/14/18 0745 01/15/18 0439 01/16/18 0253  NA 138 141 139  K 3.6 3.7 3.6  CL 111 112* 112*  CO2 19*  21* 20*  GLUCOSE 180* 100* 107*  BUN 22 19 19   CREATININE 1.43* 1.31* 1.22  CALCIUM 7.9* 8.5* 8.6*  PROT 5.8*  --   --   ALBUMIN 3.0*  --   --   AST 12*  --   --   ALT 13  --   --   ALKPHOS 57  --   --   BILITOT 0.6  --   --   GFRNONAA 49* 55* >60  GFRAA 57* >60 >60  ANIONGAP 8 8 7      Hematology Recent Labs  Lab 01/14/18 0742 01/14/18 0745 01/15/18 0439  WBC  --  9.2 9.7  RBC  --  5.02 4.93  HGB 15.3 16.9 17.0  HCT 45.0 50.5 50.2  MCV  --  100.6* 101.8*  MCH  --  33.7 34.5*  MCHC  --  33.5 33.9  RDW  --  13.2 13.5  PLT  --  206 171    Cardiac Enzymes Recent Labs  Lab 01/14/18 0745 01/14/18 0947 01/14/18 1437 01/14/18 2204  TROPONINI 0.10* 1.21* 10.26* 11.23*   No results for input(s): TROPIPOC in the last 168 hours.   Radiology    No results found.  Cardiac  Studies   Coronary/Graft Acute MI Revascularization  01/14/18  LEFT HEART CATH AND CORONARY ANGIOGRAPHY  Conclusion   Conclusions: 1. Three-vessel coronary artery disease with inferior/posterior STEMI due to thrombotic occlusion (type 1 MI) involving mid LCx.  60% mid LAD and sequential 90% mid and 60% distal RCA stenoses are also present. 2. Mildly elevated left ventricular filling pressure. 3. Challenging but successful PCI to mid and distal LCx extending to OM3 using overlapping Synergy 2.5 x 24 mm and 2.25 x 38 mm drug-eluting stents (postdilated proximally to 2.9 mm) with 0% residual stenosis and TIMI-3 flow.  Recommendations: 1. Aggressive secondary prevention. 2. Consider staged PCI to mid RCA prior to discharge, as renal function allows.  I favor medical therapy for non-critical mid LAD and distal RCA stenoses. 3. Obtain echo to assess LVEF. 4. Gentle post-cath hydration.      Recommend uninterrupted dual antiplatelet therapy with Aspirin 81mg  daily and Ticagrelor 90mg  twice daily for a minimum of 12 months (ACS - Class I recommendation).    Diagnostic Diagram       Post-Intervention  Diagram        Echo 01/15/18 Study Conclusions  - Left ventricle: The cavity size was normal. There was mild   concentric hypertrophy. Systolic function was normal. The   estimated ejection fraction was in the range of 55% to 60%.   Images were inadequate for LV wall motion assessment. Features   are consistent with a pseudonormal left ventricular filling   pattern, with concomitant abnormal relaxation and increased   filling pressure (grade 2 diastolic dysfunction). - Left atrium: The atrium was mildly dilated. - Right atrium: The atrium was mildly dilated. - Pulmonary arteries: Systolic pressure could not be accurately   estimated. - Inferior vena cava: The vessel was mildly dilated.  Patient Profile     Nicholas [name] is a 67 y.o. male with CVA x 2, HIV, CKD, and polycythemia, presenting via EMS with chest pain and inferior ST elevation.  Assessment & Plan    1. Inferior/posterior STEMI  - Found due to thrombotic occlusion (type 1 MI) involving mid LCx based on cath.  60% mid LAD and sequential 90% mid and 60% distal RCA stenoses are also present. Underwent challenging but successful PCI to mid and distal LCx extending to OM3 using overlapping Synergy 2.5 x 24 mm and 2.25 x 38 mm drug-eluting stents (postdilated proximally to 2.9 mm) with 0% residual stenosis and TIMI-3 flow. No recurrent pain. Plan for stage PCI today.  - Echo showed normal LVEF of 55-60% and garde 2 DD. - Continue ASA, Brillinta, Coreg and statin   2. AKI -Kidney function back to normal today. Follow closely post stage PCI.   3. HLD - 01/14/2018: Cholesterol 149; HDL 25; LDL Cholesterol 108; Triglycerides 81; VLDL 16  - Increase lipitor to 80mg  qd. Recheck labs in 6 weeks. LDL goal less than 70.   Dispo: Patient likes to follow up at [facility] however no appointment until last week of December, TOC made at [facility]. Pharmacy (updated) to review discharge meds pick up as he likely go home tomorrow.   For  questions or updates, please contact [place] Please consult [email or web address] for contact info under        Signed, Leanor Kail, PA  01/16/2018, 9:01 AM    I have examined the patient and reviewed assessment and plan and discussed with patient.  Agree with above as stated.  DOing well today.  Plan for PCI RCA.  D/w  Dr. Claiborne Billings.  Hopeful for discharge tomorrow morning based on results of cath today.  Larae Grooms

## 2018-01-16 NOTE — Progress Notes (Addendum)
Progress Note  Patient Name: Nicholas [name] Date of Encounter: 01/16/2018  Primary Cardiologist: New to [facility] (will be followed at [facility])  Subjective   Feeling well. No chest pain, sob or palpitations.   Inpatient Medications    Scheduled Meds: . aspirin EC  81 mg Oral Daily  . atorvastatin  40 mg Oral q1800  . bictegravir-emtricitabine-tenofovir AF  1 tablet Oral Daily  . carvedilol  3.125 mg Oral BID WC  . heparin  5,000 Units Subcutaneous Q8H  . sodium chloride flush  3 mL Intravenous Q12H  . sodium chloride flush  3 mL Intravenous Q12H  . ticagrelor  90 mg Oral BID  . umeclidinium-vilanterol  1 puff Inhalation Daily   Continuous Infusions: . sodium chloride    . sodium chloride    . sodium chloride 1 mL/kg/hr (01/16/18 8756)   PRN Meds: sodium chloride, sodium chloride, acetaminophen, albuterol, ondansetron (ZOFRAN) IV, sodium chloride flush, sodium chloride flush   Vital Signs    Vitals:   01/15/18 2257 01/16/18 0358 01/16/18 0732 01/16/18 0836  BP: 120/60 123/70 (!) 103/56   Pulse: 80 75 (!) 57 70  Resp: 16 16 20 15   Temp: 98.4 F (36.9 C) 98.1 F (36.7 C) 97.9 F (36.6 C)   TempSrc: Oral Oral Oral   SpO2: 96% 96% 97% 95%  Weight:      Height:        Intake/Output Summary (Last 24 hours) at 01/16/2018 0901 Last data filed at 01/16/2018 0800 Gross per 24 hour  Intake 973.58 ml  Output 800 ml  Net 173.58 ml   Filed Weights   01/15/18 1300 01/15/18 1949  Weight: 77.6 kg 78.1 kg    Telemetry    NSR - Personally Reviewed  ECG    None today   Physical Exam   GEN: No acute distress.   Neck: No JVD Cardiac: RRR, no murmurs, rubs, or gallops.  Respiratory: Clear to auscultation bilaterally. GI: Soft, nontender, non-distended  MS: No edema; No deformity. Neuro:  Nonfocal  Psych: Normal affect   Labs    Chemistry Recent Labs  Lab 01/14/18 0745 01/15/18 0439 01/16/18 0253  NA 138 141 139  K 3.6 3.7 3.6  CL 111 112* 112*  CO2 19*  21* 20*  GLUCOSE 180* 100* 107*  BUN 22 19 19   CREATININE 1.43* 1.31* 1.22  CALCIUM 7.9* 8.5* 8.6*  PROT 5.8*  --   --   ALBUMIN 3.0*  --   --   AST 12*  --   --   ALT 13  --   --   ALKPHOS 57  --   --   BILITOT 0.6  --   --   GFRNONAA 49* 55* >60  GFRAA 57* >60 >60  ANIONGAP 8 8 7      Hematology Recent Labs  Lab 01/14/18 0742 01/14/18 0745 01/15/18 0439  WBC  --  9.2 9.7  RBC  --  5.02 4.93  HGB 15.3 16.9 17.0  HCT 45.0 50.5 50.2  MCV  --  100.6* 101.8*  MCH  --  33.7 34.5*  MCHC  --  33.5 33.9  RDW  --  13.2 13.5  PLT  --  206 171    Cardiac Enzymes Recent Labs  Lab 01/14/18 0745 01/14/18 0947 01/14/18 1437 01/14/18 2204  TROPONINI 0.10* 1.21* 10.26* 11.23*   No results for input(s): TROPIPOC in the last 168 hours.   Radiology    No results found.  Cardiac  Studies   Coronary/Graft Acute MI Revascularization  01/14/18  LEFT HEART CATH AND CORONARY ANGIOGRAPHY  Conclusion   Conclusions: 1. Three-vessel coronary artery disease with inferior/posterior STEMI due to thrombotic occlusion (type 1 MI) involving mid LCx.  60% mid LAD and sequential 90% mid and 60% distal RCA stenoses are also present. 2. Mildly elevated left ventricular filling pressure. 3. Challenging but successful PCI to mid and distal LCx extending to OM3 using overlapping Synergy 2.5 x 24 mm and 2.25 x 38 mm drug-eluting stents (postdilated proximally to 2.9 mm) with 0% residual stenosis and TIMI-3 flow.  Recommendations: 1. Aggressive secondary prevention. 2. Consider staged PCI to mid RCA prior to discharge, as renal function allows.  I favor medical therapy for non-critical mid LAD and distal RCA stenoses. 3. Obtain echo to assess LVEF. 4. Gentle post-cath hydration.      Recommend uninterrupted dual antiplatelet therapy with Aspirin 81mg  daily and Ticagrelor 90mg  twice daily for a minimum of 12 months (ACS - Class I recommendation).    Diagnostic Diagram       Post-Intervention  Diagram        Echo 01/15/18 Study Conclusions  - Left ventricle: The cavity size was normal. There was mild   concentric hypertrophy. Systolic function was normal. The   estimated ejection fraction was in the range of 55% to 60%.   Images were inadequate for LV wall motion assessment. Features   are consistent with a pseudonormal left ventricular filling   pattern, with concomitant abnormal relaxation and increased   filling pressure (grade 2 diastolic dysfunction). - Left atrium: The atrium was mildly dilated. - Right atrium: The atrium was mildly dilated. - Pulmonary arteries: Systolic pressure could not be accurately   estimated. - Inferior vena cava: The vessel was mildly dilated.  Patient Profile     Nicholas [name] is a 67 y.o. male with CVA x 2, HIV, CKD, and polycythemia, presenting via EMS with chest pain and inferior ST elevation.  Assessment & Plan    1. Inferior/posterior STEMI  - Found due to thrombotic occlusion (type 1 MI) involving mid LCx based on cath.  60% mid LAD and sequential 90% mid and 60% distal RCA stenoses are also present. Underwent challenging but successful PCI to mid and distal LCx extending to OM3 using overlapping Synergy 2.5 x 24 mm and 2.25 x 38 mm drug-eluting stents (postdilated proximally to 2.9 mm) with 0% residual stenosis and TIMI-3 flow. No recurrent pain. Plan for stage PCI today.  - Echo showed normal LVEF of 55-60% and garde 2 DD. - Continue ASA, Brillinta, Coreg and statin   2. AKI -Kidney function back to normal today. Follow closely post stage PCI.   3. HLD - 01/14/2018: Cholesterol 149; HDL 25; LDL Cholesterol 108; Triglycerides 81; VLDL 16  - Increase lipitor to 80mg  qd. Recheck labs in 6 weeks. LDL goal less than 70.   Dispo: Patient likes to follow up at [facility] however no appointment until last week of December, TOC made at [place]. Pharmacy (updated) to review discharge meds pick up as he likely go home tomorrow.   For  questions or updates, please contact [place] Please consult [email or web address] for contact info under        Signed, Leanor Kail, PA  01/16/2018, 9:01 AM    I have examined the patient and reviewed assessment and plan and discussed with patient.  Agree with above as stated.  DOing well today.  Plan for PCI RCA.  D/w  Dr. Claiborne Billings.  Hopeful for discharge tomorrow morning based on results of cath today.  Larae Grooms

## 2018-01-17 DIAGNOSIS — E785 Hyperlipidemia, unspecified: Secondary | ICD-10-CM

## 2018-01-17 LAB — BASIC METABOLIC PANEL
Anion gap: 6 (ref 5–15)
BUN: 19 mg/dL (ref 8–23)
CO2: 18 mmol/L — ABNORMAL LOW (ref 22–32)
Calcium: 7.9 mg/dL — ABNORMAL LOW (ref 8.9–10.3)
Chloride: 115 mmol/L — ABNORMAL HIGH (ref 98–111)
Creatinine, Ser: 1.17 mg/dL (ref 0.61–1.24)
GFR calc Af Amer: >60 mL/min (ref >60)
GFR calc non Af Amer: >60 mL/min (ref >60)
Glucose, Bld: 101 mg/dL — ABNORMAL HIGH (ref 70–99)
Potassium: 3.7 mmol/L (ref 3.5–5.1)
Sodium: 139 mmol/L (ref 135–145)

## 2018-01-17 LAB — HEPATIC FUNCTION PANEL
ALT: 12 U/L (ref 0–44)
AST: 14 U/L — ABNORMAL LOW (ref 15–41)
Albumin: 2.8 g/dL — ABNORMAL LOW (ref 3.5–5.0)
Alkaline Phosphatase: 59 U/L (ref 38–126)
Bilirubin, Direct: 0.2 mg/dL (ref 0.0–0.2)
Indirect Bilirubin: 0.7 mg/dL (ref 0.3–0.9)
Total Bilirubin: 0.9 mg/dL (ref 0.3–1.2)
Total Protein: 5.6 g/dL — ABNORMAL LOW (ref 6.5–8.1)

## 2018-01-17 LAB — CBC
HCT: 49.6 % (ref 39.0–52.0)
Hemoglobin: 16.9 g/dL (ref 13.0–17.0)
MCH: 34 pg (ref 26.0–34.0)
MCHC: 34.1 g/dL (ref 30.0–36.0)
MCV: 99.8 fL (ref 80.0–100.0)
Platelets: 184 10*3/uL (ref 150–400)
RBC: 4.97 MIL/uL (ref 4.22–5.81)
RDW: 12.8 % (ref 11.5–15.5)
WBC: 8.9 10*3/uL (ref 4.0–10.5)
nRBC: 0 % (ref 0.0–0.2)

## 2018-01-17 MED ORDER — ATORVASTATIN CALCIUM 40 MG PO TABS: 40.0000 mg | ORAL_TABLET | Freq: Every day | ORAL | 5 refills | Status: DC

## 2018-01-17 MED ORDER — CARVEDILOL 3.125 MG PO TABS: 3.1250 mg | ORAL_TABLET | Freq: Two times a day (BID) | ORAL | 5 refills | Status: DC

## 2018-01-17 MED ORDER — ASPIRIN EC 81 MG PO TBEC: 81.0000 mg | DELAYED_RELEASE_TABLET | Freq: Every day | ORAL | Status: DC

## 2018-01-17 MED ORDER — TICAGRELOR 90 MG PO TABS: 90.0000 mg | ORAL_TABLET | Freq: Two times a day (BID) | ORAL | 11 refills | Status: DC

## 2018-01-17 NOTE — Progress Notes (Signed)
Progress Note  Patient Name: Nicholas [name] Date of Encounter: 01/17/2018  Primary Cardiologist: No primary care provider on file.   Subjective   No chest pain.  Wants to go home.   Inpatient Medications    Scheduled Meds: . amLODipine  5 mg Oral Daily  . aspirin EC  81 mg Oral Daily  . atorvastatin  40 mg Oral q1800  . bictegravir-emtricitabine-tenofovir AF  1 tablet Oral Daily  . carvedilol  3.125 mg Oral BID WC  . heparin  5,000 Units Subcutaneous Q8H  . sodium chloride flush  3 mL Intravenous Q12H  . sodium chloride flush  3 mL Intravenous Q12H  . ticagrelor  90 mg Oral BID  . umeclidinium-vilanterol  1 puff Inhalation Daily   Continuous Infusions: . sodium chloride    . sodium chloride 125 mL/hr at 01/16/18 1505  . sodium chloride     PRN Meds: sodium chloride, sodium chloride, acetaminophen, albuterol, diazepam, ondansetron (ZOFRAN) IV, sodium chloride flush, sodium chloride flush   Vital Signs    Vitals:   01/16/18 2153 01/16/18 2300 01/17/18 0800 01/17/18 0823  BP: (!) 144/62   133/87  Pulse: 73     Resp:    15  Temp:  98.2 F (36.8 C)  98 F (36.7 C)  TempSrc:  Oral  Oral  SpO2:   98%   Weight:      Height:        Intake/Output Summary (Last 24 hours) at 01/17/2018 0833 Last data filed at 01/17/2018 0823 Gross per 24 hour  Intake 128 ml  Output 1625 ml  Net -1497 ml   Filed Weights   01/15/18 1300 01/15/18 1949  Weight: 77.6 kg 78.1 kg    Telemetry    NSR - Personally Reviewed  ECG    NSR, inferior T wave inversion less pronounced - Personally Reviewed  Physical Exam   GEN: No acute distress.   Neck: No JVD Cardiac: RRR, no murmurs, rubs, or gallops.  Respiratory: Clear to auscultation bilaterally. GI: Soft, nontender, non-distended  MS: No edema; No deformity. Bruising on right wrist.  Soft Neuro:  Nonfocal  Psych: Normal affect   Labs    Chemistry Recent Labs  Lab 01/14/18 0745 01/15/18 0439 01/16/18 0253  01/17/18 0221  NA 138 141 139 139  K 3.6 3.7 3.6 3.7  CL 111 112* 112* 115*  CO2 19* 21* 20* 18*  GLUCOSE 180* 100* 107* 101*  BUN 22 19 19 19   CREATININE 1.43* 1.31* 1.22 1.17  CALCIUM 7.9* 8.5* 8.6* 7.9*  PROT 5.8*  --   --  5.6*  ALBUMIN 3.0*  --   --  2.8*  AST 12*  --   --  14*  ALT 13  --   --  12  ALKPHOS 57  --   --  59  BILITOT 0.6  --   --  0.9  GFRNONAA 49* 55* >60 >60  GFRAA 57* >60 >60 >60  ANIONGAP 8 8 7 6      Hematology Recent Labs  Lab 01/15/18 0439 01/16/18 1407 01/17/18 0221  WBC 9.7 8.8 8.9  RBC 4.93 5.37 4.97  HGB 17.0 18.6* 16.9  HCT 50.2 54.1* 49.6  MCV 101.8* 100.7* 99.8  MCH 34.5* 34.6* 34.0  MCHC 33.9 34.4 34.1  RDW 13.5 12.9 12.8  PLT 171 187 184    Cardiac Enzymes Recent Labs  Lab 01/14/18 0745 01/14/18 0947 01/14/18 1437 01/14/18 2204  TROPONINI 0.10* 1.21* 10.26* 11.23*  No results for input(s): TROPIPOC in the last 168 hours.   BNPNo results for input(s): BNP, PROBNP in the last 168 hours.   DDimer No results for input(s): DDIMER in the last 168 hours.   Radiology    No results found.  Cardiac Studies   Cath report reviewed; EF 55-60% by echo  Patient Profile     67 y.o. male with recent ST elevation MI involving the circumflex, followed by PCI of the RCA yesterday.  Assessment & Plan    1) CAD/MI: Doing well.  No angina.  He wants to go home.  Continue dual antiplatelet therapy.  Continue aggressive secondary prevention including beta-blocker and high potency statin along with dual antiplatelet therapy.  Consider addition of ACE inhibitor as outpatient based on blood pressure.  No sign of heart failure on exam.  Stressed importance of Brilinta.  2) precautions for radial site given.  He does have bruising but no signs of active bleeding.  Arm is soft.  He will look for any change.  3) HIV: Continue home medications.  Discharge later today.  For questions or updates, please contact [place] Please consult  [email or web address] for contact info under        Signed, Larae Grooms, MD  01/17/2018, 8:33 AM

## 2018-01-17 NOTE — Discharge Instructions (Addendum)
Transradial Angiogram Transradial angiogram is an imaging test. This test uses X-ray images and colored dye that is made up of an iodine solution (contrast dye). This test is done to check for any abnormalities in the vessels that might affect blood flow, such as:  A blocked blood vessel.  A narrowed blood vessel.  A blood clot.  Abnormal, inherited blood vessel connections.  During this test, a small, flexible tube (catheter) is inserted into an artery in the wrist (radial artery). The catheter is moved from the radial artery into other blood vessels in the body that need to be examined. Contrast dye is used to make blood vessels visible on X-ray images that are taken during the procedure. Tell a health care provider about:  Any allergies you have.  All medicines you are taking, including vitamins, herbs, eye drops, creams, and over-the-counter medicines.  Any problems you or family members have had with anesthetic medicines.  Any blood disorders you have.  Any surgeries you have had.  Any medical conditions you have.  Whether you are pregnant or may be pregnant. What are the risks? Generally, this is a safe procedure. However, problems may occur, including:  Infection.  Bleeding.  Allergic reactions to medicines or dyes.  Damage to other structures or organs, such as the blood vessels, lungs, or heart.  Blood clots.  Blood flow through the radial artery stopping or slowing down. This is rare.  What happens before the procedure?  Ask your health care provider about: ? Changing or stopping your regular medicines. This is especially important if you are taking diabetes medicines or blood thinners. ? Taking medicines such as aspirin and ibuprofen. These medicines can thin your blood. Do not take these medicines before your procedure if your health care provider instructs you not to.  Follow instructions from your health care provider about eating or drinking  restrictions.  Do not use tobacco products for at least 24 hours before your procedure or as told by your health care provider. This includes cigarettes, chewing tobacco, or e-cigarettes.  Ask your health care provider how your surgical site will be marked or identified.  You may be given antibiotic medicine to help prevent infection.  You may have a physical exam.  You may have tests, including: ? Blood tests. ? X-rays.  Plan to have someone take you home after the procedure.  If you will be going home right after the procedure, plan to have someone with you for 24 hours. What happens during the procedure?  To reduce your risk of infection: ? Your health care team will wash or sanitize their hands. ? Your skin will be washed with soap.  An IV tube will be inserted into one of your veins.  You will be given the following: ? A medicine to help you relax (sedative). ? A medicine that is injected to numb the area near the radial artery in your wrist (local anesthetic).  A needle will be inserted into your radial artery in your wrist.  A catheter will be inserted into your radial artery. The needle helps guide the catheter into your radial artery.  The catheter will be moved through your body to the desired blood vessel. An X-ray machine (fluoroscope) will help your health care provider bring the catheter to the correct place in your body.  Contrast dye will be injected into the catheter and will travel to the blood vessel that is being examined.  X-ray images will be taken of how the  dye flows through your blood vessel. While the images are being taken, you may be given instructions on breathing, swallowing, moving, or talking.  The catheter and needle will be removed from your body.  A pressure (compression) wrap will be applied to your wrist to stop bleeding. The procedure may vary among health care providers and hospitals. What happens after the procedure?  You will need  to keep your wrist still for as long as told by your health care provider.  The pressure applied to your wrist will be gradually decreased until the compression wrap is removed.  You may have soreness and bruising in your wrist. This is normal. This should get better within about 1 week.  Your blood pressure, heart rate, breathing rate, and blood oxygen level will be monitored often until the medicines you were given have worn off.  You may continue to receive fluids and medicines through an IV tube.  Do not drive for 24 hours if you received a sedative.  You may have to wear compression stockings. These stockings help to prevent blood clots and reduce swelling in your legs. This information is not intended to replace advice given to you by your health care provider. Make sure you discuss any questions you have with your health care provider. Document Released: 11/01/2011 Document Revised: 10/10/2015 Document Reviewed: 01/11/2015 Elsevier Interactive Patient Education  [employer].

## 2018-01-17 NOTE — Care Management (Signed)
1021 01-17-18 Staff RN to pick up Brilinta via [place]- held for patient. No further needs from CM at this time. Bethena Roys, RN,BSN Case Manager [phone]

## 2018-01-17 NOTE — Progress Notes (Signed)
Brilinta  60 tabs taken from the pharmacy and gave to pt.

## 2018-01-17 NOTE — Progress Notes (Signed)
Discharged home by wheelchair accompanied by sister, discharge instructions and prescription given to pt. Belongings taken home.

## 2018-01-17 NOTE — Discharge Summary (Addendum)
Discharge Summary    Patient ID: Nicholas [name],  MRN: [id number], DOB/AGE: 10/15/1950 67 y.o.  Admit date: 01/14/2018 Discharge date: 01/17/2018  Primary Care Provider: Stoney Bang A Primary Cardiologist: Dr. Saunders Revel this admission --> wishes to follow-up in [place], [place]  Discharge Diagnoses    Principal Problem:   STEMI (ST elevation myocardial infarction) ([place]) Active Problems:   CKD (chronic kidney disease) stage 3, GFR 30-59 ml/min (HCC)   STEMI involving left circumflex coronary artery ([place])   Coronary artery disease involving native coronary artery of native heart with unstable angina pectoris ([place])   Hyperlipidemia LDL goal <70   History of Present Illness     Nicholas [name] is a 67 y.o. male with past medical history of HTN, Stage 3 CKD, HIV, prior CVA, and tobacco use who presented to [facility] on 01/14/2018 as a CODE STEMI.   He reported being in his usual state of health until 0330 this morning of admission when he awoke with substernal chest pain. Upon EMS arrival, his initial EKG showed ST elevation along the inferior leads and CODE STEMI was activated and he was transported to [facility] for an emergent cardiac catheterization.   Hospital Course     Consultants: None  Upon arrival, he was still having 9/10 chest pain. Initial catheterization performed by Dr. Saunders Revel showed 3-vessel CAD with inferior/posterior STEMI due to thrombotic occlusion (type 1 MI) involving mid LCx. Also noted to have 60% mid LAD and sequential 90% mid and 60% distal RCA stenoses. He underwent PCI to the mid and distal LCx using overlapping Synergy 2.5 x 24 mm and 2.25 x 38 mm drug-eluting stents. Was started on DAPT with ASA and Brilinta with consideration of staged PCI or RCA prior to discharge.   Troponin values peaked at 11.23 during admission. Hgb A1c normal at 4.8 but FLP did show total cholesterol of 149, HDL 25, and LDL 108 and he was started on Atorvastatin 40mg  daily. Renal function was  followed closely after his initial catheterization and he returned back to the cath lab on 01/16/2018 and underwent successful PCI to the 95% mid RCA stenosis and the distal 85% RCA stenosis with DESx2.   On 01/17/2018, he denied any recurrent chest pain and was ambulating without difficulty. Was examined by Dr. Irish Lack and deemed stable for discharge. He will continue on ASA, Brilinta, BB, and statin therapy with consideration of restarting PTA Olmesartan as an outpatient following repeat BMET (creatinine was stable at 1.17 on the day of hospital discharge). A 7-day TOC visit has been arranged at the [facility] office and then he will establish care with an MD in [facility]. He was discharged home in good condition.   _____________  Discharge Vitals Blood pressure 133/87, pulse 73, temperature 98 F (36.7 C), temperature source Oral, resp. rate 15, height 5' 10.5" (1.791 m), weight 78.1 kg, SpO2 98 %.  Filed Weights   01/15/18 1300 01/15/18 1949  Weight: 77.6 kg 78.1 kg    Labs & Radiologic Studies     CBC Recent Labs    01/16/18 1407 01/17/18 0221  WBC 8.8 8.9  HGB 18.6* 16.9  HCT 54.1* 49.6  MCV 100.7* 99.8  PLT 187 283   Basic Metabolic Panel Recent Labs    01/16/18 0253 01/17/18 0221  NA 139 139  K 3.6 3.7  CL 112* 115*  CO2 20* 18*  GLUCOSE 107* 101*  BUN 19 19  CREATININE 1.22 1.17  CALCIUM 8.6* 7.9*  Liver Function Tests Recent Labs    01/17/18 0221  AST 14*  ALT 12  ALKPHOS 59  BILITOT 0.9  PROT 5.6*  ALBUMIN 2.8*   No results for input(s): LIPASE, AMYLASE in the last 72 hours. Cardiac Enzymes Recent Labs    01/14/18 1437 01/14/18 2204  TROPONINI 10.26* 11.23*   BNP Invalid input(s): POCBNP D-Dimer No results for input(s): DDIMER in the last 72 hours. Hemoglobin A1C No results for input(s): HGBA1C in the last 72 hours. Fasting Lipid Panel No results for input(s): CHOL, HDL, LDLCALC, TRIG, CHOLHDL, LDLDIRECT in the last 72  hours. Thyroid Function Tests No results for input(s): TSH, T4TOTAL, T3FREE, THYROIDAB in the last 72 hours.  Invalid input(s): FREET3  No results found.   Diagnostic Studies/Procedures     Cardiac Catheterization: 01/14/2018 Conclusions: 1. Three-vessel coronary artery disease with inferior/posterior STEMI due to thrombotic occlusion (type 1 MI) involving mid LCx.  60% mid LAD and sequential 90% mid and 60% distal RCA stenoses are also present. 2. Mildly elevated left ventricular filling pressure. 3. Challenging but successful PCI to mid and distal LCx extending to OM3 using overlapping Synergy 2.5 x 24 mm and 2.25 x 38 mm drug-eluting stents (postdilated proximally to 2.9 mm) with 0% residual stenosis and TIMI-3 flow.  Recommendations: 1. Aggressive secondary prevention. 2. Consider staged PCI to mid RCA prior to discharge, as renal function allows.  I favor medical therapy for non-critical mid LAD and distal RCA stenoses. 3. Obtain echo to assess LVEF. 4. Gentle post-cath hydration.      Recommend uninterrupted dual antiplatelet therapy with Aspirin 81mg  daily and Ticagrelor 90mg  twice daily for a minimum of 12 months (ACS - Class I recommendation).   Coronary Stent Intervention: 01/16/2018  Dist RCA lesion is 85% stenosed.  Prox RCA lesion is 20% stenosed.  Mid RCA lesion is 95% stenosed.  Post intervention, there is a 0% residual stenosis.  Post intervention, there is a 0% residual stenosis.  A stent was successfully placed.  A stent was successfully placed.   Very difficult but ultimately successful PCI to the 95% mid RCA stenosis and the distal 85% RCA stenosis which appeared more significant since the initial evaluation.  A 2.25 x 15 mm Xience Sierra DES stent was inserted distally with post stent dilatation up to 2.6 mm.  A 2.5 x 15 mm Xience Sierra stent was inserted after much difficulty into the mid site with ultimate post stent dilatation up to 2.78 mm.  Post  stent stenoses were reduced to 0%.  LVEDP 11 mmHg.  RECOMMENDATION: Continued medical therapy for concomitant CAD.  High potency statin therapy with target LDL less than 70.  Optimal blood pressure control with target blood pressure less than 130/80.  Post MI beta-blocker, ACE/ARB inhibition. Recommend uninterrupted dual antiplatelet therapy with Aspirin 81mg  daily and Ticagrelor 90mg  twice daily for a minimum of 12 months (ACS - Class I recommendation).  Echocardiogram: 01/15/2018 Study Conclusions  - Left ventricle: The cavity size was normal. There was mild   concentric hypertrophy. Systolic function was normal. The   estimated ejection fraction was in the range of 55% to 60%.   Images were inadequate for LV wall motion assessment. Features   are consistent with a pseudonormal left ventricular filling   pattern, with concomitant abnormal relaxation and increased   filling pressure (grade 2 diastolic dysfunction). - Left atrium: The atrium was mildly dilated. - Right atrium: The atrium was mildly dilated. - Pulmonary arteries: Systolic pressure could  not be accurately   estimated. - Inferior vena cava: The vessel was mildly dilated.  Disposition   Pt is being discharged home today in good condition.  Follow-up Plans & Appointments    Follow-up Information    Barrett, Evelene Croon, PA-C. Go on 01/25/2018.   Specialties:  Cardiology, Radiology Why:  @10 :00am for hosptial follow up Contact information: [address] [place] [address] [phone]          Discharge Instructions    Amb Referral to Cardiac Rehabilitation   Complete by:  As directed    Diagnosis:   Coronary Stents STEMI     Diet - low sodium heart healthy   Complete by:  As directed    Discharge instructions   Complete by:  As directed    PLEASE REMEMBER TO BRING ALL OF [place].  PLEASE ATTEND ALL SCHEDULED FOLLOW-UP APPOINTMENTS.    Activity: Increase activity slowly as tolerated. You may shower, but no soaking baths (or swimming) for 1 week. You may not return to work until cleared by your cardiologist. No lifting over 10 lbs for 4 weeks. No sexual activity for 4 weeks.   Wound Care: You may wash cath site gently with soap and water. Keep cath site clean and dry. If you notice pain, swelling, bleeding or pus at your cath site, please call [phone].   Increase activity slowly   Complete by:  As directed       Discharge Medications     Medication List    STOP taking these medications   hydrochlorothiazide 25 MG tablet Commonly known as:  HYDRODIURIL   olmesartan 40 MG tablet Commonly known as:  BENICAR     TAKE these medications   amLODipine 5 MG tablet Commonly known as:  NORVASC TAKE 1 TABLET (5 MG TOTAL) BY MOUTH DAILY.   ANORO ELLIPTA 62.5-25 MCG/INH Aepb Generic drug:  umeclidinium-vilanterol Take 1 puff by mouth every morning.   aspirin EC 81 MG tablet Take 1 tablet (81 mg total) by mouth daily.   atorvastatin 40 MG tablet Commonly known as:  LIPITOR Take 1 tablet (40 mg total) by mouth daily at 6 PM.   bictegravir-emtricitabine-tenofovir AF 50-200-25 MG Tabs tablet Commonly known as:  BIKTARVY Take 1 tablet by mouth daily.   carvedilol 3.125 MG tablet Commonly known as:  COREG Take 1 tablet (3.125 mg total) by mouth 2 (two) times daily with a meal.   ticagrelor 90 MG Tabs tablet Commonly known as:  BRILINTA Take 1 tablet (90 mg total) by mouth 2 (two) times daily.   VENTOLIN HFA 108 (90 Base) MCG/ACT inhaler Generic drug:  albuterol Take 1-2 puffs by mouth every 6 (six) hours as needed for shortness of breath or wheezing.         Allergies Allergies  Allergen Reactions  . Penicillins Anaphylaxis    Has patient had a PCN reaction causing immediate rash, facial/tongue/throat swelling, SOB or lightheadedness with hypotension: Yes Has patient had a PCN reaction causing  severe rash involving mucus membranes or skin necrosis: No Has patient had a PCN reaction that required hospitalization: No Has patient had a PCN reaction occurring within the last 10 years: No If all of the above answers are "NO", then may proceed with Cephalosporin use.      Acute coronary syndrome (MI, NSTEMI, STEMI, etc) this admission?: Yes.     AHA/ACC Clinical Performance & Quality Measures: 1. Aspirin prescribed? -  Yes 2. ADP Receptor Inhibitor (Plavix/Clopidogrel, Brilinta/Ticagrelor or Effient/Prasugrel) prescribed (includes medically managed patients)? - Yes 3. Beta Blocker prescribed? - Yes 4. High Intensity Statin (Lipitor 40-80mg  or Crestor 20-40mg ) prescribed? - Yes 5. EF assessed during THIS hospitalization? - Yes 6. For EF <40%, was ACEI/ARB prescribed? - Not Applicable (EF >/= 77%) 7. For EF <40%, Aldosterone Antagonist (Spironolactone or Eplerenone) prescribed? - Not Applicable (EF >/= 03%) 8. Cardiac Rehab Phase II ordered (Included Medically managed Patients)? - Yes    Outstanding Labs/Studies   BMET at follow-up.   Duration of Discharge Encounter   Greater than 30 minutes including physician time.  Signed, Erma Heritage, PA-C 01/17/2018, 10:19 AM   I have examined the patient and reviewed assessment and plan and discussed with patient.  Agree with above as stated.   CAD/MI: Doing well.  No angina.  He wants to go home.  Continue dual antiplatelet therapy.  Continue aggressive secondary prevention including beta-blocker and high potency statin along with dual antiplatelet therapy.  Consider addition of ACE inhibitor as outpatient based on blood pressure.  No sign of heart failure on exam.  Stressed importance of Brilinta.  2) precautions for radial site given.  He does have bruising but no signs of active bleeding.  Arm is soft.  He will look for any change.  3) HIV: Continue home medications.  Larae Grooms

## 2018-01-18 ENCOUNTER — Encounter (HOSPITAL_COMMUNITY): Payer: Self-pay | Admitting: Cardiovascular Disease

## 2018-01-21 NOTE — Telephone Encounter (Signed)
Patient contacted regarding discharge from [facility] on 01/17/18.  Patient understands to follow up with provider Rosaria Ferries, PA on 01/25/18 at 10:00 am at [facility]. Patient understands discharge instructions? Yes Patient understands medications and regiment? Yes Patient understands to bring all medications to this visit? Yes  Patient did mention a complaint due to his wrist being bruised, and would like for Suanne Marker to look at it with his appointment.

## 2018-01-24 NOTE — Progress Notes (Signed)
Cardiology Office Note   Date:  01/25/2018   ID:  Nicholas [name], DOB 02/20/1951, MRN [id number]  PCP:  Neale Burly, MD Cardiologist:  No primary care provider on file.  Seen by Dr. Saunders Revel in hospital, wants follow-up in [place], PA-C    History of Present Illness: Nicholas [name] is a 67 y.o. male with a history of HTN, Stage 3 CKD, HIV, prior CVA, and tobacco use   Admitted 11/25-11/28/2019 for STEMI, DES CFX, peak troponin 12.23, staged PCI RCA w/ DES x2 on 11/27  Nicholas [name] presents for cardiology follow up.  He has a complaint about the treatment after the 2nd cath, he had swelling and numbness in his fingers.   He lives alone in an apartment, his sister's husband is in the hospital with cardiac issues. Otherwise, she would be able to help.   He was very SOB when he got out of the hospital, that has improved.   No chest pain.   Extensive ecchymosis, at cath site, IV sites, needle stick sites. This is slowly resolving.   He has had some cramping chest pain, like he slept on it wrong. It resolves spontaneously, nothing like his MI pain.  He has not had any symptoms reminiscent of this.   Past Medical History:  Diagnosis Date  . Chronic kidney disease   . Grover's disease 2017  . HIV (human immunodeficiency virus infection) ([place])   . Hypertension   . Polycythemia 2017  . Seasonal allergies 2017  . Stroke (cerebrum) [facility])     Past Surgical History:  Procedure Laterality Date  . CORONARY STENT INTERVENTION N/A 01/16/2018   Procedure: CORONARY STENT INTERVENTION;  Surgeon: Troy Sine, MD;  Location: [place] CV LAB;  Service: Cardiovascular;  Laterality: N/A;  . CORONARY/GRAFT ACUTE MI REVASCULARIZATION N/A 01/14/2018   Procedure: Coronary/Graft Acute MI Revascularization;  Surgeon: Nelva Bush, MD;  Location: [place] CV LAB;  Service: Cardiovascular;  Laterality: N/A;  . LEFT HEART CATH N/A 01/16/2018   Procedure: Left Heart Cath;   Surgeon: Troy Sine, MD;  Location: [place] CV LAB;  Service: Cardiovascular;  Laterality: N/A;  . LEFT HEART CATH AND CORONARY ANGIOGRAPHY N/A 01/14/2018   Procedure: LEFT HEART CATH AND CORONARY ANGIOGRAPHY;  Surgeon: Nelva Bush, MD;  Location: [place] CV LAB;  Service: Cardiovascular;  Laterality: N/A;  . ULTRASOUND GUIDANCE FOR VASCULAR ACCESS  01/16/2018   Procedure: Ultrasound Guidance For Vascular Access;  Surgeon: Troy Sine, MD;  Location: [place] CV LAB;  Service: Cardiovascular;;    Current Outpatient Medications  Medication Sig Dispense Refill  . amLODipine (NORVASC) 5 MG tablet TAKE 1 TABLET (5 MG TOTAL) BY MOUTH DAILY. 30 tablet 5  . ANORO ELLIPTA 62.5-25 MCG/INH AEPB Take 1 puff by mouth every morning.  1  . aspirin EC 81 MG tablet Take 1 tablet (81 mg total) by mouth daily.    Marland Kitchen atorvastatin (LIPITOR) 40 MG tablet Take 1 tablet (40 mg total) by mouth daily at 6 PM. 30 tablet 5  . bictegravir-emtricitabine-tenofovir AF (BIKTARVY) 50-200-25 MG TABS tablet Take 1 tablet by mouth daily. 30 tablet 11  . carvedilol (COREG) 3.125 MG tablet Take 1 tablet (3.125 mg total) by mouth 2 (two) times daily with a meal. 60 tablet 5  . ticagrelor (BRILINTA) 90 MG TABS tablet Take 1 tablet (90 mg total) by mouth 2 (two) times daily. 60 tablet 11  . VENTOLIN HFA 108 (90 Base) MCG/ACT inhaler  Take 1-2 puffs by mouth every 6 (six) hours as needed for shortness of breath or wheezing.  5   No current facility-administered medications for this visit.     Allergies:   Penicillins    Social History:  The patient  reports that he has been smoking cigarettes. He has been smoking about 1.00 pack per day. He has never used smokeless tobacco. He reports that he drank alcohol. He reports that he has current or past drug history. Drug: Marijuana.   Family History:  The patient's family history includes Heart disease in his mother.  He indicated that the status of his mother is  unknown.   ROS:  Please see the history of present illness. All other systems are reviewed and negative.    PHYSICAL EXAM: VS:  BP 138/74   Pulse 64   Ht 5' 10.5" (1.791 m)   Wt 173 lb (78.5 kg)   BMI 24.47 kg/m  , BMI Body mass index is 24.47 kg/m. GEN: Well nourished, well developed, male in no acute distress HEENT: normal for age  Neck: no JVD, no carotid bruit, no masses Cardiac: RRR; no murmur, no rubs, or gallops Respiratory:  clear to auscultation bilaterally, normal work of breathing GI: soft, nontender, nondistended, + BS MS: no deformity or atrophy; no edema; distal pulses are 2+ in all 4 extremities  Skin: warm and dry, no rash; extensive ecchymosis both arms and some on abdomen as well. Neuro:  Strength and sensation are intact Psych: euthymic mood, full affect   EKG:  EKG is ordered today. The ekg ordered today demonstrates SR, HR 64, no acute ischemic changes, Q waves III only   Cardiac Catheterization: 01/14/2018 Conclusions: 1. Three-vessel coronary artery disease with inferior/posterior STEMI due to thrombotic occlusion (type 1 MI) involving mid LCx. 60% mid LAD and sequential 90% mid and 60% distal RCA stenoses are also present. 2. Mildly elevated left ventricular filling pressure. 3. Challenging but successful PCI to mid and distal LCx extending to OM3 using overlapping Synergy 2.5 x 24 mm and 2.25 x 38 mm drug-eluting stents (postdilated proximally to 2.9 mm) with 0% residual stenosis and TIMI-3 flow.  Recommendations: 1. Aggressive secondary prevention. 2. Consider staged PCI to mid RCA prior to discharge, as renal function allows. I favor medical therapy for non-critical mid LAD and distal RCA stenoses. 3. Obtain echo to assess LVEF. 4. Gentle post-cath hydration.  Recommend uninterrupted dual antiplatelet therapy with Aspirin 81mg  daily and Ticagrelor 90mg  twice dailyfor a minimum of 12 months (ACS - Class I recommendation).  Coronary  Stent Intervention: 01/16/2018  Dist RCA lesion is 85% stenosed.  Prox RCA lesion is 20% stenosed.  Mid RCA lesion is 95% stenosed.  Post intervention, there is a 0% residual stenosis.  Post intervention, there is a 0% residual stenosis.  A stent was successfully placed.  A stent was successfully placed.  Very difficult but ultimately successful PCI to the 95% mid RCA stenosis and the distal 85% RCA stenosis which appeared more significant since the initial evaluation. A 2.25 x 15 mm Xience Sierra DES stent was inserted distally with post stent dilatation up to 2.6 mm. A 2.5 x 15 mm Xience Sierra stent was inserted after much difficulty into the mid site with ultimate post stent dilatation up to 2.78 mm. Post stent stenoses were reduced to 0%.  LVEDP 11 mmHg.  RECOMMENDATION: Continued medical therapy for concomitant CAD. High potency statin therapy with target LDL less than 70. Optimal blood pressure  control with target blood pressure less than 130/80. Post MI beta-blocker, ACE/ARB inhibition. Recommend uninterrupted dual antiplatelet therapy with Aspirin 81mg  daily and Ticagrelor 90mg  twice dailyfor a minimum of 12 months (ACS - Class I recommendation).  Echocardiogram: 01/15/2018 Study Conclusions  - Left ventricle: The cavity size was normal. There was mild concentric hypertrophy. Systolic function was normal. The estimated ejection fraction was in the range of 55% to 60%. Images were inadequate for LV wall motion assessment. Features are consistent with a pseudonormal left ventricular filling pattern, with concomitant abnormal relaxation and increased filling pressure (grade 2 diastolic dysfunction). - Left atrium: The atrium was mildly dilated. - Right atrium: The atrium was mildly dilated. - Pulmonary arteries: Systolic pressure could not be accurately estimated. - Inferior vena cava: The vessel was mildly dilated.  Recent Labs: 01/17/2018:  ALT 12; BUN 19; Creatinine, Ser 1.17; Hemoglobin 16.9; Platelets 184; Potassium 3.7; Sodium 139  CBC    Component Value Date/Time   WBC 8.9 01/17/2018 0221   RBC 4.97 01/17/2018 0221   HGB 16.9 01/17/2018 0221   HCT 49.6 01/17/2018 0221   PLT 184 01/17/2018 0221   MCV 99.8 01/17/2018 0221   MCH 34.0 01/17/2018 0221   MCHC 34.1 01/17/2018 0221   RDW 12.8 01/17/2018 0221   LYMPHSABS 2,544 05/23/2017 1415   EOSABS 175 05/23/2017 1415   BASOSABS 93 05/23/2017 1415   CMP Latest Ref Rng & Units 01/17/2018 01/16/2018 01/15/2018  Glucose 70 - 99 mg/dL 101(H) 107(H) 100(H)  BUN 8 - 23 mg/dL 19 19 19   Creatinine 0.61 - 1.24 mg/dL 1.17 1.22 1.31(H)  Sodium 135 - 145 mmol/L 139 139 141  Potassium 3.5 - 5.1 mmol/L 3.7 3.6 3.7  Chloride 98 - 111 mmol/L 115(H) 112(H) 112(H)  CO2 22 - 32 mmol/L 18(L) 20(L) 21(L)  Calcium 8.9 - 10.3 mg/dL 7.9(L) 8.6(L) 8.5(L)  Total Protein 6.5 - 8.1 g/dL 5.6(L) - -  Total Bilirubin 0.3 - 1.2 mg/dL 0.9 - -  Alkaline Phos 38 - 126 U/L 59 - -  AST 15 - 41 U/L 14(L) - -  ALT 0 - 44 U/L 12 - -     Lipid Panel Lab Results  Component Value Date   CHOL 149 01/14/2018   HDL 25 (L) 01/14/2018   LDLCALC 108 (H) 01/14/2018   TRIG 81 01/14/2018   CHOLHDL 6.0 01/14/2018      Wt Readings from Last 3 Encounters:  01/25/18 173 lb (78.5 kg)  01/15/18 172 lb 2.9 oz (78.1 kg)  12/04/17 173 lb (78.5 kg)     Other studies Reviewed: Additional studies/ records that were reviewed today include: hospital records and testing .  ASSESSMENT AND PLAN:  1.  STEMI, subsequent episode of care:  - pt is tolerating ASA, Brilinta, low-dose beta-blocker. -Believe his blood pressure will tolerate an increase in the beta-blocker, this was not done until after he left the office, he was called.  Left message on his voicemail. -He is interested in cardiac rehab, will refer him to rehab at [place] - He requested follow-up in [place], will arrange  2.  Dyslipidemia, goal LDL less  than 70: - I confirmed with the pharmacist that atorvastatin 40 mg did not interact with Biktarvy, continue this -Check labs prior to his follow-up appointment in [place]  3.  Hypertension: -She was on amlodipine 5 mg prior to his hospital admission. -Carvedilol 3.125 mg was added in the hospital -We will increase this to 6.25 mg twice daily. -Decrease amlodipine  if his blood pressure will not tolerate both   Current medicines are reviewed at length with the patient today.  The patient has concerns regarding medicines.  Concerns were addressed  The following changes have been made: Increase carvedilol  Labs/ tests ordered today include:   Orders Placed This Encounter  Procedures  . Lipid panel  . Comprehensive metabolic panel  . AMB referral to cardiac rehabilitation  . EKG 12-Lead     Disposition:   FU in the [facility] office  Signed, Rosaria Ferries, PA-C  01/25/2018 12:27 PM    [place] Phone: [phone]; Fax: [phone]

## 2018-01-25 ENCOUNTER — Encounter: Payer: Self-pay | Admitting: Physician Assistant

## 2018-01-25 ENCOUNTER — Ambulatory Visit (INDEPENDENT_AMBULATORY_CARE_PROVIDER_SITE_OTHER): Payer: Medicare HMO | Admitting: Physician Assistant

## 2018-01-25 VITALS — BP 138/74 | HR 64 | Ht 70.5 in | Wt 173.0 lb

## 2018-01-25 DIAGNOSIS — I213 ST elevation (STEMI) myocardial infarction of unspecified site: Secondary | ICD-10-CM

## 2018-01-25 DIAGNOSIS — Z79899 Other long term (current) drug therapy: Secondary | ICD-10-CM

## 2018-01-25 DIAGNOSIS — E785 Hyperlipidemia, unspecified: Secondary | ICD-10-CM

## 2018-01-25 DIAGNOSIS — I10 Essential (primary) hypertension: Secondary | ICD-10-CM | POA: Diagnosis not present

## 2018-01-25 MED ORDER — CARVEDILOL 6.25 MG PO TABS: 6.2500 mg | ORAL_TABLET | Freq: Two times a day (BID) | ORAL | 11 refills | Status: DC

## 2018-01-25 MED ORDER — AMLODIPINE BESYLATE 5 MG PO TABS: 5.0000 mg | ORAL_TABLET | Freq: Every day | ORAL | 5 refills | Status: DC

## 2018-01-25 NOTE — Patient Instructions (Signed)
Medication Instructions:  NO CHANGES- Your physician recommends that you continue on your current medications as directed. Please refer to the Current Medication list given to you today. If you need a refill on your cardiac medications before your next appointment, please call your pharmacy.  Labwork: CMET AND LIPIDS 3 DAYS BEFORE FOLLOW UP APPT IN [place]. Take the provided lab slips with you to the lab for your blood draw.   You will need to fast. DO NOT EAT OR DRINK PAST MIDNIGHT.   If you have labs (blood work) drawn today and your tests are completely normal, you will receive your results only by: Marland Kitchen MyChart Message (if you have MyChart) OR . A paper copy in the mail If you have any lab test that is abnormal or we need to change your treatment, we will call you to review the results.  Special Instructions: OK TO START CARDIAC REHAB IN [place]-THEY WILL BE CALLING YOU FOR APPT  Follow-Up: You will need a follow up appointment in 3 MONTHS IN [place].    You may see one of the doctors in [place] or one of the [place] Providers on your designated Care Team. At [facility], you and your health needs are our priority.  As part of our continuing mission to provide you with exceptional heart care, we have created designated Provider Care Teams.  These Care Teams include your primary Cardiologist (physician) and Advanced Practice Providers (APPs -  Physician Assistants and Nurse Practitioners) who all work together to provide you with the care you need, when you need it.

## 2018-02-14 MED FILL — AMLODIPINE BESYLATE 5 MG TA: 5 | 30 days supply | Qty: 30 | Fill #2

## 2018-02-14 MED FILL — BIKTARVY 50-200-25 MG TABS: 50-200-25 | 30 days supply | Qty: 30 | Fill #8

## 2018-02-14 MED FILL — OLMESARTAN MEDOXOMIL 40 MG: 40 | 30 days supply | Qty: 30 | Fill #1

## 2018-02-14 MED FILL — BRILINTA 90 MG TABLET: 90 | 30 days supply | Qty: 60 | Fill #0

## 2018-02-28 DIAGNOSIS — M10262 Drug-induced gout, left knee: Secondary | ICD-10-CM | POA: Diagnosis not present

## 2018-02-28 DIAGNOSIS — G459 Transient cerebral ischemic attack, unspecified: Secondary | ICD-10-CM | POA: Diagnosis not present

## 2018-02-28 DIAGNOSIS — G622 Polyneuropathy due to other toxic agents: Secondary | ICD-10-CM | POA: Diagnosis not present

## 2018-02-28 DIAGNOSIS — I2119 ST elevation (STEMI) myocardial infarction involving other coronary artery of inferior wall: Secondary | ICD-10-CM | POA: Diagnosis not present

## 2018-02-28 DIAGNOSIS — Z0000 Encounter for general adult medical examination without abnormal findings: Secondary | ICD-10-CM | POA: Diagnosis not present

## 2018-02-28 DIAGNOSIS — I10 Essential (primary) hypertension: Secondary | ICD-10-CM | POA: Diagnosis not present

## 2018-02-28 DIAGNOSIS — B20 Human immunodeficiency virus [HIV] disease: Secondary | ICD-10-CM | POA: Diagnosis not present

## 2018-02-28 DIAGNOSIS — J440 Chronic obstructive pulmonary disease with acute lower respiratory infection: Secondary | ICD-10-CM | POA: Diagnosis not present

## 2018-02-28 DIAGNOSIS — Z6826 Body mass index (BMI) 26.0-26.9, adult: Secondary | ICD-10-CM | POA: Diagnosis not present

## 2018-03-11 MED FILL — BRILINTA 90 MG TABLET: 90 | 30 days supply | Qty: 60 | Fill #1

## 2018-03-11 MED FILL — AMLODIPINE BESYLATE 5 MG TA: 5 | 30 days supply | Qty: 30 | Fill #3

## 2018-03-11 MED FILL — OLMESARTAN MEDOXOMIL 40 MG: 40 | 30 days supply | Qty: 30 | Fill #2

## 2018-03-13 ENCOUNTER — Telehealth: Payer: Self-pay

## 2018-03-13 ENCOUNTER — Other Ambulatory Visit: Payer: Self-pay | Admitting: Pharmacist

## 2018-03-13 DIAGNOSIS — B20 Human immunodeficiency virus [HIV] disease: Secondary | ICD-10-CM

## 2018-03-13 MED ORDER — BICTEGRAVIR-EMTRICITAB-TENOFOV 50-200-25 MG PO TABS: 1.0000 | ORAL_TABLET | Freq: Every day | ORAL | 11 refills | Status: DC

## 2018-03-13 NOTE — Progress Notes (Signed)
Patient has SPAP for Biktarvy copay assistance. Will send to [facility].

## 2018-03-13 NOTE — Telephone Encounter (Signed)
Patient returning call from Ronney Asters, Pharmacy Tech. Unable to reach [place] at this time,however, transferred patient's call to pharmacy to leave voicemail.  [place]

## 2018-03-15 ENCOUNTER — Telehealth: Payer: Self-pay | Admitting: Pharmacy Technician

## 2018-03-15 NOTE — Telephone Encounter (Signed)
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  He understands and will go there to get his med.

## 2018-04-17 ENCOUNTER — Ambulatory Visit: Payer: Medicare HMO | Admitting: Urology

## 2018-04-23 DIAGNOSIS — I10 Essential (primary) hypertension: Secondary | ICD-10-CM | POA: Diagnosis not present

## 2018-04-23 DIAGNOSIS — Z79899 Other long term (current) drug therapy: Secondary | ICD-10-CM | POA: Diagnosis not present

## 2018-04-24 LAB — COMPREHENSIVE METABOLIC PANEL
ALT: 13 IU/L (ref 0–44)
AST: 10 IU/L (ref 0–40)
Albumin/Globulin Ratio: 1.7 (ref 1.2–2.2)
Albumin: 4.5 g/dL (ref 3.8–4.8)
Alkaline Phosphatase: 122 IU/L — ABNORMAL HIGH (ref 39–117)
BUN/Creatinine Ratio: 15 (ref 10–24)
BUN: 18 mg/dL (ref 8–27)
Bilirubin Total: 0.9 mg/dL (ref 0.0–1.2)
CO2: 20 mmol/L (ref 20–29)
Calcium: 9.4 mg/dL (ref 8.6–10.2)
Chloride: 107 mmol/L — ABNORMAL HIGH (ref 96–106)
Creatinine, Ser: 1.24 mg/dL (ref 0.76–1.27)
GFR calc Af Amer: 69 mL/min/{1.73_m2} (ref >59)
GFR calc non Af Amer: 60 mL/min/{1.73_m2} (ref >59)
Globulin, Total: 2.7 g/dL (ref 1.5–4.5)
Glucose: 92 mg/dL (ref 65–99)
Potassium: 4.5 mmol/L (ref 3.5–5.2)
Sodium: 144 mmol/L (ref 134–144)
Total Protein: 7.2 g/dL (ref 6.0–8.5)

## 2018-04-26 ENCOUNTER — Encounter: Payer: Self-pay | Admitting: Cardiology

## 2018-04-26 NOTE — Progress Notes (Deleted)
Cardiology Office Note  Date: 04/26/2018   ID: [name], DOB 1950/11/02, MRN [id number]  PCP: Neale Burly, MD  Evaluating cardiologist: Rozann Lesches, MD   No chief complaint on file.   History of Present Illness: Nicholas [name] is a 68 y.o. male that I am meeting for the first time in clinic today.  He was seen most recently by Ms. Barrett PA-C in December 2019.  I reviewed extensive records and updated the chart.  Past Medical History:  Diagnosis Date  . CAD (coronary artery disease)    Overlapping DES x2 to the mid to distal circumflex extending into OM 3 01/14/2018, DES x2 to the mid to distal RCA 01/16/2018  . Chronic kidney disease   . Grover's disease 2017  . HIV (human immunodeficiency virus infection) ([place])   . Hypertension   . Polycythemia 2017  . Seasonal allergies 2017  . ST elevation myocardial infarction (STEMI) of inferior wall [facility])    November 2019  . Stroke (cerebrum) [facility])     Past Surgical History:  Procedure Laterality Date  . CORONARY STENT INTERVENTION N/A 01/16/2018   Procedure: CORONARY STENT INTERVENTION;  Surgeon: Troy Sine, MD;  Location: [place] CV LAB;  Service: Cardiovascular;  Laterality: N/A;  . CORONARY/GRAFT ACUTE MI REVASCULARIZATION N/A 01/14/2018   Procedure: Coronary/Graft Acute MI Revascularization;  Surgeon: Nelva Bush, MD;  Location: [place] CV LAB;  Service: Cardiovascular;  Laterality: N/A;  . LEFT HEART CATH N/A 01/16/2018   Procedure: Left Heart Cath;  Surgeon: Troy Sine, MD;  Location: [place] CV LAB;  Service: Cardiovascular;  Laterality: N/A;  . LEFT HEART CATH AND CORONARY ANGIOGRAPHY N/A 01/14/2018   Procedure: LEFT HEART CATH AND CORONARY ANGIOGRAPHY;  Surgeon: Nelva Bush, MD;  Location: [place] CV LAB;  Service: Cardiovascular;  Laterality: N/A;  . ULTRASOUND GUIDANCE FOR VASCULAR ACCESS  01/16/2018   Procedure: Ultrasound Guidance For Vascular Access;  Surgeon: Troy Sine, MD;  Location: [place] CV LAB;  Service: Cardiovascular;;    Current Outpatient Medications  Medication Sig Dispense Refill  . amLODipine (NORVASC) 5 MG tablet Take 1 tablet (5 mg total) by mouth daily. 30 tablet 5  . ANORO ELLIPTA 62.5-25 MCG/INH AEPB Take 1 puff by mouth every morning.  1  . aspirin EC 81 MG tablet Take 1 tablet (81 mg total) by mouth daily.    Marland Kitchen atorvastatin (LIPITOR) 40 MG tablet Take 1 tablet (40 mg total) by mouth daily at 6 PM. 30 tablet 5  . bictegravir-emtricitabine-tenofovir AF (BIKTARVY) 50-200-25 MG TABS tablet Take 1 tablet by mouth daily. 30 tablet 11  . carvedilol (COREG) 6.25 MG tablet Take 1 tablet (6.25 mg total) by mouth 2 (two) times daily with a meal. 60 tablet 11  . ticagrelor (BRILINTA) 90 MG TABS tablet Take 1 tablet (90 mg total) by mouth 2 (two) times daily. 60 tablet 11  . VENTOLIN HFA 108 (90 Base) MCG/ACT inhaler Take 1-2 puffs by mouth every 6 (six) hours as needed for shortness of breath or wheezing.  5   No current facility-administered medications for this visit.    Allergies:  Penicillins   Social History: The patient  reports that he has been smoking cigarettes. He has been smoking about 1.00 pack per day. He has never used smokeless tobacco. He reports previous alcohol use. He reports current drug use. Drug: Marijuana.   Family History: The patient's family history includes Heart disease in his mother.  ROS:  Please see the history of present illness. Otherwise, complete review of systems is positive for {NONE DEFAULTED:18576::"none"}.  All other systems are reviewed and negative.   Physical Exam: VS:  There were no vitals taken for this visit., BMI There is no height or weight on file to calculate BMI.  Wt Readings from Last 3 Encounters:  01/25/18 173 lb (78.5 kg)  01/15/18 172 lb 2.9 oz (78.1 kg)  12/04/17 173 lb (78.5 kg)    General: Patient appears comfortable at rest. HEENT: Conjunctiva and lids normal,  oropharynx clear with moist mucosa. Neck: Supple, no elevated JVP or carotid bruits, no thyromegaly. Lungs: Clear to auscultation, nonlabored breathing at rest. Cardiac: Regular rate and rhythm, no S3 or significant systolic murmur, no pericardial rub. Abdomen: Soft, nontender, no hepatomegaly, bowel sounds present, no guarding or rebound. Extremities: No pitting edema, distal pulses 2+. Skin: Warm and dry. Musculoskeletal: No kyphosis. Neuropsychiatric: Alert and oriented x3, affect grossly appropriate.  ECG: I personally reviewed the tracing from 01/27/2018 which showed normal sinus rhythm.  Recent Labwork: 01/17/2018: Hemoglobin 16.9; Platelets 184 04/23/2018: ALT 13; AST 10; BUN 18; Creatinine, Ser 1.24; Potassium 4.5; Sodium 144     Component Value Date/Time   CHOL 149 01/14/2018 0745   TRIG 81 01/14/2018 0745   HDL 25 (L) 01/14/2018 0745   CHOLHDL 6.0 01/14/2018 0745   VLDL 16 01/14/2018 0745   LDLCALC 108 (H) 01/14/2018 0745   LDLCALC 114 (H) 05/23/2017 1415    Other Studies Reviewed Today:  Cardiac catheterization 01/14/2018: Conclusions: 1. Three-vessel coronary artery disease with inferior/posterior STEMI due to thrombotic occlusion (type 1 MI) involving mid LCx.  60% mid LAD and sequential 90% mid and 60% distal RCA stenoses are also present. 2. Mildly elevated left ventricular filling pressure. 3. Challenging but successful PCI to mid and distal LCx extending to OM3 using overlapping Synergy 2.5 x 24 mm and 2.25 x 38 mm drug-eluting stents (postdilated proximally to 2.9 mm) with 0% residual stenosis and TIMI-3 flow.  Recommendations: 1. Aggressive secondary prevention. 2. Consider staged PCI to mid RCA prior to discharge, as renal function allows.  I favor medical therapy for non-critical mid LAD and distal RCA stenoses. 3. Obtain echo to assess LVEF. 4. Gentle post-cath hydration.      Recommend uninterrupted dual antiplatelet therapy with Aspirin 81mg  daily and  Ticagrelor 90mg  twice daily for a minimum of 12 months (ACS - Class I recommendation).   Cardiac catheterization 01/16/2018:  Dist RCA lesion is 85% stenosed.  Prox RCA lesion is 20% stenosed.  Mid RCA lesion is 95% stenosed.  Post intervention, there is a 0% residual stenosis.  Post intervention, there is a 0% residual stenosis.  A stent was successfully placed.  A stent was successfully placed.   Very difficult but ultimately successful PCI to the 95% mid RCA stenosis and the distal 85% RCA stenosis which appeared more significant since the initial evaluation.  A 2.25 x 15 mm Xience Sierra DES stent was inserted distally with post stent dilatation up to 2.6 mm.  A 2.5 x 15 mm Xience Sierra stent was inserted after much difficulty into the mid site with ultimate post stent dilatation up to 2.78 mm.  Post stent stenoses were reduced to 0%.  LVEDP 11 mmHg.  RECOMMENDATION: Continued medical therapy for concomitant CAD.  High potency statin therapy with target LDL less than 70.  Optimal blood pressure control with target blood pressure less than 130/80.  Post MI beta-blocker, ACE/ARB  inhibition. Recommend uninterrupted dual antiplatelet therapy with Aspirin 81mg  daily and Ticagrelor 90mg  twice daily for a minimum of 12 months (ACS - Class I recommendation).  Echocardiogram 01/15/2018: Study Conclusions  - Left ventricle: The cavity size was normal. There was mild   concentric hypertrophy. Systolic function was normal. The   estimated ejection fraction was in the range of 55% to 60%.   Images were inadequate for LV wall motion assessment. Features   are consistent with a pseudonormal left ventricular filling   pattern, with concomitant abnormal relaxation and increased   filling pressure (grade 2 diastolic dysfunction). - Left atrium: The atrium was mildly dilated. - Right atrium: The atrium was mildly dilated. - Pulmonary arteries: Systolic pressure could not be accurately    estimated. - Inferior vena cava: The vessel was mildly dilated.  Assessment and Plan:    Current medicines were reviewed with the patient today.  No orders of the defined types were placed in this encounter.   Disposition:  Signed, Satira Sark, MD, [facility] 04/26/2018 1:13 PM    [facility] at [place], [place], [place] [address] Phone: [phone]; Fax: [phone]

## 2018-04-29 ENCOUNTER — Ambulatory Visit: Payer: Medicare HMO | Admitting: Cardiology

## 2018-05-02 DIAGNOSIS — Z9849 Cataract extraction status, unspecified eye: Secondary | ICD-10-CM | POA: Diagnosis not present

## 2018-05-02 DIAGNOSIS — Z961 Presence of intraocular lens: Secondary | ICD-10-CM | POA: Diagnosis not present

## 2018-05-02 DIAGNOSIS — I10 Essential (primary) hypertension: Secondary | ICD-10-CM | POA: Diagnosis not present

## 2018-05-02 DIAGNOSIS — H35033 Hypertensive retinopathy, bilateral: Secondary | ICD-10-CM | POA: Diagnosis not present

## 2018-05-21 ENCOUNTER — Telehealth: Payer: Self-pay | Admitting: *Deleted

## 2018-05-21 NOTE — Telephone Encounter (Signed)
Spoke with sister and she says that patient currently doesn't have a phone and no longer lives with her and she has no way of getting in contact with him but will let him know to call office when she speaks with him.

## 2018-05-22 ENCOUNTER — Ambulatory Visit: Payer: Medicare HMO | Admitting: Cardiology

## 2018-06-06 ENCOUNTER — Other Ambulatory Visit: Payer: Medicare HMO

## 2018-06-11 ENCOUNTER — Ambulatory Visit: Payer: Medicare HMO | Admitting: Internal Medicine

## 2018-06-20 ENCOUNTER — Other Ambulatory Visit: Payer: Self-pay

## 2018-06-20 ENCOUNTER — Encounter: Payer: Self-pay | Admitting: Internal Medicine

## 2018-06-20 ENCOUNTER — Ambulatory Visit (INDEPENDENT_AMBULATORY_CARE_PROVIDER_SITE_OTHER): Payer: Medicare HMO | Admitting: Internal Medicine

## 2018-06-20 DIAGNOSIS — B20 Human immunodeficiency virus [HIV] disease: Secondary | ICD-10-CM | POA: Diagnosis not present

## 2018-06-20 NOTE — Progress Notes (Signed)
Virtual Visit via Telephone Note  I connected with [name] on 06/20/18 at 11:00 AM EDT by telephone and verified that I am speaking with the correct person using two identifiers.  Location: Patient: Home Provider: [place] for Infectious Disease   I discussed the limitations, risks, security and privacy concerns of performing an evaluation and management service by telephone and the availability of in person appointments. I also discussed with the patient that there may be a patient responsible charge related to this service. The patient expressed understanding and agreed to proceed.   History of Present Illness: I spoke to [place] by phone today.  He has not had any problems obtaining, taking or tolerating his Biktarvy and does not recall missing any doses.  In late November he was awakened around 3 AM with chest pain.  He initially thought he had heartburn but the pain continued and he went to the hospital and was found to have a NSTEMI, probably due to an acute thrombotic blockage.  He underwent PTCA with stenting.  He has not had any chest pain since that time.  However he has noted dyspnea on exertion that began after discharge.  It has not worsened but it has not gone away.  He has had some swelling of his left foot and ankle recently.  He does not weigh himself.  He has not had any fever, sore throat or cough.  He is down to 1 cigarette daily.   Observations/Objective: HIV 1 RNA Quant  Date Value  11/26/2017 <20 NOT DETECTED copies/mL  05/23/2017 <20 Copies/mL (H)   CD4 T Cell Abs (/uL)  Date Value  11/26/2017 980  05/23/2017 980    Assessment and Plan: His adherence is perfect and his infection has been under excellent, long-term control.  He will continue Biktarvy and follow-up here for blood work in 3 months.  I encouraged him to go ahead with this plan to quit smoking completely.  He is planning on starting nicotine patches soon.  I asked him to contact his  cardiologist, Dr. Johnny Bridge, if he has any more chest pain or worsening dyspnea on exertion.  Follow Up Instructions: Continue Biktarvy and follow-up for blood work in 3 months   I discussed the assessment and treatment plan with the patient. The patient was provided an opportunity to ask questions and all were answered. The patient agreed with the plan and demonstrated an understanding of the instructions.   The patient was advised to call back or seek an in-person evaluation if the symptoms worsen or if the condition fails to improve as anticipated.  I provided 15 minutes of non-face-to-face time during this encounter.   Michel Bickers, MD

## 2018-06-26 DIAGNOSIS — I10 Essential (primary) hypertension: Secondary | ICD-10-CM | POA: Diagnosis not present

## 2018-06-26 DIAGNOSIS — Z21 Asymptomatic human immunodeficiency virus [HIV] infection status: Secondary | ICD-10-CM | POA: Diagnosis not present

## 2018-06-26 DIAGNOSIS — X500XXA Overexertion from strenuous movement or load, initial encounter: Secondary | ICD-10-CM | POA: Diagnosis not present

## 2018-06-26 DIAGNOSIS — F17200 Nicotine dependence, unspecified, uncomplicated: Secondary | ICD-10-CM | POA: Diagnosis not present

## 2018-06-26 DIAGNOSIS — Z23 Encounter for immunization: Secondary | ICD-10-CM | POA: Diagnosis not present

## 2018-06-26 DIAGNOSIS — S51012A Laceration without foreign body of left elbow, initial encounter: Secondary | ICD-10-CM | POA: Diagnosis not present

## 2018-06-26 DIAGNOSIS — Z79899 Other long term (current) drug therapy: Secondary | ICD-10-CM | POA: Diagnosis not present

## 2018-06-28 DIAGNOSIS — S51012D Laceration without foreign body of left elbow, subsequent encounter: Secondary | ICD-10-CM | POA: Diagnosis not present

## 2018-06-28 DIAGNOSIS — Z4801 Encounter for change or removal of surgical wound dressing: Secondary | ICD-10-CM | POA: Diagnosis not present

## 2018-06-28 DIAGNOSIS — Z21 Asymptomatic human immunodeficiency virus [HIV] infection status: Secondary | ICD-10-CM | POA: Diagnosis not present

## 2018-06-28 DIAGNOSIS — I10 Essential (primary) hypertension: Secondary | ICD-10-CM | POA: Diagnosis not present

## 2018-06-28 DIAGNOSIS — Z79899 Other long term (current) drug therapy: Secondary | ICD-10-CM | POA: Diagnosis not present

## 2018-06-28 DIAGNOSIS — F17200 Nicotine dependence, unspecified, uncomplicated: Secondary | ICD-10-CM | POA: Diagnosis not present

## 2018-06-28 DIAGNOSIS — Z4800 Encounter for change or removal of nonsurgical wound dressing: Secondary | ICD-10-CM | POA: Diagnosis not present

## 2018-06-30 DIAGNOSIS — I10 Essential (primary) hypertension: Secondary | ICD-10-CM | POA: Diagnosis not present

## 2018-06-30 DIAGNOSIS — F17200 Nicotine dependence, unspecified, uncomplicated: Secondary | ICD-10-CM | POA: Diagnosis not present

## 2018-06-30 DIAGNOSIS — Z21 Asymptomatic human immunodeficiency virus [HIV] infection status: Secondary | ICD-10-CM | POA: Diagnosis not present

## 2018-06-30 DIAGNOSIS — Z79899 Other long term (current) drug therapy: Secondary | ICD-10-CM | POA: Diagnosis not present

## 2018-06-30 DIAGNOSIS — Z4800 Encounter for change or removal of nonsurgical wound dressing: Secondary | ICD-10-CM | POA: Diagnosis not present

## 2018-06-30 DIAGNOSIS — Z4801 Encounter for change or removal of surgical wound dressing: Secondary | ICD-10-CM | POA: Diagnosis not present

## 2018-06-30 DIAGNOSIS — S51012D Laceration without foreign body of left elbow, subsequent encounter: Secondary | ICD-10-CM | POA: Diagnosis not present

## 2018-07-15 ENCOUNTER — Other Ambulatory Visit: Payer: Self-pay | Admitting: Student

## 2018-07-17 ENCOUNTER — Other Ambulatory Visit: Payer: Self-pay | Admitting: Student

## 2018-09-10 ENCOUNTER — Encounter: Payer: Self-pay | Admitting: Cardiology

## 2018-09-10 ENCOUNTER — Other Ambulatory Visit: Payer: Self-pay

## 2018-09-10 ENCOUNTER — Ambulatory Visit (INDEPENDENT_AMBULATORY_CARE_PROVIDER_SITE_OTHER): Payer: Medicare HMO | Admitting: Cardiology

## 2018-09-10 ENCOUNTER — Telehealth: Payer: Self-pay | Admitting: *Deleted

## 2018-09-10 VITALS — BP 160/80 | HR 85 | Temp 99.1°F | Ht 70.0 in | Wt 191.0 lb

## 2018-09-10 DIAGNOSIS — Z79899 Other long term (current) drug therapy: Secondary | ICD-10-CM

## 2018-09-10 DIAGNOSIS — I739 Peripheral vascular disease, unspecified: Secondary | ICD-10-CM

## 2018-09-10 DIAGNOSIS — E782 Mixed hyperlipidemia: Secondary | ICD-10-CM

## 2018-09-10 DIAGNOSIS — I10 Essential (primary) hypertension: Secondary | ICD-10-CM

## 2018-09-10 DIAGNOSIS — I25119 Atherosclerotic heart disease of native coronary artery with unspecified angina pectoris: Secondary | ICD-10-CM

## 2018-09-10 NOTE — Progress Notes (Signed)
Cardiology Office Note  Date: 09/10/2018   ID: Gaige Sebo, DOB 10/03/1950, MRN [id number]  PCP:  Neale Burly, MD  Evaluating Cardiologist:  Rozann Lesches, MD Electrophysiologist:  None   Chief Complaint  Patient presents with   Coronary Artery Disease    History of Present Illness: Carvin Almas is a 68 y.o. male that I am meeting for the first time today.  I reviewed extensive records and updated the chart.  He was most recently seen in the office by Ms. Barrett PA-C in December 2019.  He presents today reporting no angina symptoms, chronic dyspnea on exertion.  He does not report any spontaneous bleeding problems on aspirin and Brilinta.  I reviewed his remaining medications.  He states that he has been compliant with Lipitor and we plan to obtain a follow-up lipid panel.  He states that he does some walking at his apartment complex for exercise.  He states that he does have intermittent leg cramping with activity suspicious for claudication.  Past Medical History:  Diagnosis Date   CAD (coronary artery disease)    Overlapping DES x2 to the mid to distal circumflex extending into OM 3 01/14/2018, DES x2 to the mid to distal RCA 01/16/2018   Essential hypertension    Grover's disease 2017   HIV (human immunodeficiency virus infection) ([place])    Polycythemia 2017   Seasonal allergies 2017   ST elevation myocardial infarction (STEMI) of inferior wall [facility])    November 2019   Stroke (cerebrum) [facility])     Past Surgical History:  Procedure Laterality Date   CORONARY STENT INTERVENTION N/A 01/16/2018   Procedure: CORONARY STENT INTERVENTION;  Surgeon: Troy Sine, MD;  Location: [place] CV LAB;  Service: Cardiovascular;  Laterality: N/A;   CORONARY/GRAFT ACUTE MI REVASCULARIZATION N/A 01/14/2018   Procedure: Coronary/Graft Acute MI Revascularization;  Surgeon: Nelva Bush, MD;  Location: [place] CV LAB;  Service: Cardiovascular;  Laterality:  N/A;   LEFT HEART CATH N/A 01/16/2018   Procedure: Left Heart Cath;  Surgeon: Troy Sine, MD;  Location: [place] CV LAB;  Service: Cardiovascular;  Laterality: N/A;   LEFT HEART CATH AND CORONARY ANGIOGRAPHY N/A 01/14/2018   Procedure: LEFT HEART CATH AND CORONARY ANGIOGRAPHY;  Surgeon: Nelva Bush, MD;  Location: [place] CV LAB;  Service: Cardiovascular;  Laterality: N/A;   ULTRASOUND GUIDANCE FOR VASCULAR ACCESS  01/16/2018   Procedure: Ultrasound Guidance For Vascular Access;  Surgeon: Troy Sine, MD;  Location: [place] CV LAB;  Service: Cardiovascular;;    Current Outpatient Medications  Medication Sig Dispense Refill   amLODipine (NORVASC) 5 MG tablet Take 1 tablet (5 mg total) by mouth daily. 30 tablet 5   ANORO ELLIPTA 62.5-25 MCG/INH AEPB Take 1 puff by mouth every morning.  1   aspirin EC 81 MG tablet Take 1 tablet (81 mg total) by mouth daily.     atorvastatin (LIPITOR) 40 MG tablet TAKE 1 TABLET DAILY AT 6 PM 90 tablet 0   bictegravir-emtricitabine-tenofovir AF (BIKTARVY) 50-200-25 MG TABS tablet Take 1 tablet by mouth daily. 30 tablet 11   carvedilol (COREG) 3.125 MG tablet Take 1 tablet (3.125 mg total) by mouth 2 (two) times daily. 180 tablet 2   olmesartan (BENICAR) 40 MG tablet TK 1 T PO QD     ticagrelor (BRILINTA) 90 MG TABS tablet Take 1 tablet (90 mg total) by mouth 2 (two) times daily. 60 tablet 11   VENTOLIN HFA 108 (90  Base) MCG/ACT inhaler Take 1-2 puffs by mouth every 6 (six) hours as needed for shortness of breath or wheezing.  5   No current facility-administered medications for this visit.    Allergies:  Penicillins   Social History: The patient  reports that he has been smoking cigarettes. He has been smoking about 1.00 pack per day. He has never used smokeless tobacco. He reports previous alcohol use. He reports current drug use. Drug: Marijuana.   ROS:  Please see the history of present illness. Otherwise, complete  review of systems is positive for none.  All other systems are reviewed and negative.   Physical Exam: VS:  BP (!) 160/80    Pulse 85    Temp 99.1 F (37.3 C)    Ht 5\' 10"  (1.778 m)    Wt 191 lb (86.6 kg)    BMI 27.41 kg/m , BMI Body mass index is 27.41 kg/m.  Wt Readings from Last 3 Encounters:  09/10/18 191 lb (86.6 kg)  01/25/18 173 lb (78.5 kg)  01/15/18 172 lb 2.9 oz (78.1 kg)    General: Patient appears comfortable at rest. HEENT: Conjunctiva and lids normal, wearing a mask. Neck: Supple, no elevated JVP or carotid bruits, no thyromegaly. Lungs: Clear to auscultation, nonlabored breathing at rest. Cardiac: Regular rate and rhythm, no S3 or significant systolic murmur, no pericardial rub. Abdomen: Soft, nontender, bowel sounds present. Extremities: Venous stasis, decreased distal pulses. Skin: Warm and dry. Musculoskeletal: No kyphosis. Neuropsychiatric: Alert and oriented x3, affect grossly appropriate.  ECG:  An ECG dated 01/25/2018 was personally reviewed today and demonstrated:  Normal sinus rhythm.  Recent Labwork: 01/17/2018: Hemoglobin 16.9; Platelets 184 04/23/2018: ALT 13; AST 10; BUN 18; Creatinine, Ser 1.24; Potassium 4.5; Sodium 144     Component Value Date/Time   CHOL 149 01/14/2018 0745   TRIG 81 01/14/2018 0745   HDL 25 (L) 01/14/2018 0745   CHOLHDL 6.0 01/14/2018 0745   VLDL 16 01/14/2018 0745   LDLCALC 108 (H) 01/14/2018 0745   LDLCALC 114 (H) 05/23/2017 1415    Other Studies Reviewed Today:  Echocardiogram 01/15/2018: Study Conclusions  - Left ventricle: The cavity size was normal. There was mild   concentric hypertrophy. Systolic function was normal. The   estimated ejection fraction was in the range of 55% to 60%.   Images were inadequate for LV wall motion assessment. Features   are consistent with a pseudonormal left ventricular filling   pattern, with concomitant abnormal relaxation and increased   filling pressure (grade 2 diastolic  dysfunction). - Left atrium: The atrium was mildly dilated. - Right atrium: The atrium was mildly dilated. - Pulmonary arteries: Systolic pressure could not be accurately   estimated. - Inferior vena cava: The vessel was mildly dilated.  Assessment and Plan:  1.  History of inferior infarct back in November 2019 treated with overlapping DES x2 to the mid to distal circumflex and also DES x2 to the mid to distal RCA and staged fashion.  He reports no active angina at this time and remains on aspirin and Brilinta.  I talked with him about continued medical therapy, at least 1 year of dual antiplatelet regimen, also walking for exercise.  2.  Intermittent leg cramping suspicion for claudication.  Lower extremity arterial Dopplers will be obtained.  3.  Mixed hyperlipidemia, he reports compliance with Lipitor.  Will check fasting lipid profile and LFTs.  4.  Essential hypertension, blood pressure is elevated today.  He reports compliance with  Norvasc, Coreg and Benicar.  Discussed salt restriction and exercise.  Keep follow-up with PCP.  5.  Tobacco abuse.  Will continue to discuss smoking cessation over time.  Medication Adjustments/Labs and Tests Ordered: Current medicines are reviewed at length with the patient today.  Concerns regarding medicines are outlined above.   Tests Ordered: Orders Placed This Encounter  Procedures   Hepatic function panel   Lipid panel    Medication Changes: No orders of the defined types were placed in this encounter.   Disposition:  Follow up 6 months in the [place] office.  Signed, Satira Sark, MD, [facility] 09/10/2018 4:03 PM    [facility] at [place], [place], [place] [address] Phone: [phone]; Fax: [phone]

## 2018-09-10 NOTE — Telephone Encounter (Signed)
LE arterial doppler needed in addition to lab work per [employer].

## 2018-09-10 NOTE — Patient Instructions (Addendum)
Medication Instructions:    Your physician recommends that you continue on your current medications as directed. Please refer to the Current Medication list given to you today.  Labwork:  Your physician recommends that you return for a FASTING lipid/liver profile: as soon as possible. Please do not eat or drink for at least 8 hours when you have this done. You may take your medications that morning with a sip of water.   Testing/Procedures:  Your physician has requested that you have a lower extremity arterial exercise duplex with ABI's. During this test, exercise and ultrasound are used to evaluate arterial blood flow in the legs. Allow one hour for this exam. There are no restrictions or special instructions.  Follow-Up:  Your physician recommends that you schedule a follow-up appointment in: 6 months. You will receive a reminder letter in the mail in about 4 months reminding you to call and schedule your appointment. If you don't receive this letter, please contact our office.  Any Other Special Instructions Will Be Listed Below (If Applicable).  If you need a refill on your cardiac medications before your next appointment, please call your pharmacy.

## 2018-09-11 NOTE — Telephone Encounter (Signed)
Patient aware.

## 2018-09-12 ENCOUNTER — Other Ambulatory Visit: Payer: Self-pay | Admitting: Cardiology

## 2018-09-12 DIAGNOSIS — I739 Peripheral vascular disease, unspecified: Secondary | ICD-10-CM

## 2018-09-18 DIAGNOSIS — I25119 Atherosclerotic heart disease of native coronary artery with unspecified angina pectoris: Secondary | ICD-10-CM | POA: Diagnosis not present

## 2018-09-18 DIAGNOSIS — Z79899 Other long term (current) drug therapy: Secondary | ICD-10-CM | POA: Diagnosis not present

## 2018-09-18 DIAGNOSIS — E782 Mixed hyperlipidemia: Secondary | ICD-10-CM | POA: Diagnosis not present

## 2018-09-19 ENCOUNTER — Ambulatory Visit: Payer: Medicare HMO | Admitting: Internal Medicine

## 2018-09-24 ENCOUNTER — Telehealth: Payer: Self-pay | Admitting: *Deleted

## 2018-09-24 NOTE — Telephone Encounter (Signed)
-----   Message from Satira Sark, MD sent at 09/20/2018  9:05 AM EDT ----- Results reviewed.  AST and ALT are normal.  Total cholesterol 135 and LDL 44.  Continue with current medications.

## 2018-09-24 NOTE — Telephone Encounter (Signed)
Patient informed. Copy sent to PCP °

## 2018-09-30 DIAGNOSIS — Z131 Encounter for screening for diabetes mellitus: Secondary | ICD-10-CM | POA: Diagnosis not present

## 2018-09-30 DIAGNOSIS — Z6829 Body mass index (BMI) 29.0-29.9, adult: Secondary | ICD-10-CM | POA: Diagnosis not present

## 2018-09-30 DIAGNOSIS — J440 Chronic obstructive pulmonary disease with acute lower respiratory infection: Secondary | ICD-10-CM | POA: Diagnosis not present

## 2018-09-30 DIAGNOSIS — Z0000 Encounter for general adult medical examination without abnormal findings: Secondary | ICD-10-CM | POA: Diagnosis not present

## 2018-09-30 DIAGNOSIS — I2119 ST elevation (STEMI) myocardial infarction involving other coronary artery of inferior wall: Secondary | ICD-10-CM | POA: Diagnosis not present

## 2018-09-30 DIAGNOSIS — M10262 Drug-induced gout, left knee: Secondary | ICD-10-CM | POA: Diagnosis not present

## 2018-09-30 DIAGNOSIS — I10 Essential (primary) hypertension: Secondary | ICD-10-CM | POA: Diagnosis not present

## 2018-09-30 DIAGNOSIS — G459 Transient cerebral ischemic attack, unspecified: Secondary | ICD-10-CM | POA: Diagnosis not present

## 2018-09-30 DIAGNOSIS — G622 Polyneuropathy due to other toxic agents: Secondary | ICD-10-CM | POA: Diagnosis not present

## 2018-09-30 DIAGNOSIS — B20 Human immunodeficiency virus [HIV] disease: Secondary | ICD-10-CM | POA: Diagnosis not present

## 2018-10-14 ENCOUNTER — Telehealth: Payer: Self-pay | Admitting: Cardiology

## 2018-10-14 NOTE — Telephone Encounter (Signed)
°  Precert needed for: VAS [place] LOWER EXT ART SEG MULTI [facility]   Location: [facility]     Date: Sept 16, 2020

## 2018-11-06 ENCOUNTER — Other Ambulatory Visit: Payer: Self-pay

## 2018-11-06 ENCOUNTER — Ambulatory Visit (INDEPENDENT_AMBULATORY_CARE_PROVIDER_SITE_OTHER): Payer: Medicare HMO

## 2018-11-06 DIAGNOSIS — I739 Peripheral vascular disease, unspecified: Secondary | ICD-10-CM

## 2018-11-07 ENCOUNTER — Telehealth: Payer: Self-pay | Admitting: *Deleted

## 2018-11-07 DIAGNOSIS — I739 Peripheral vascular disease, unspecified: Secondary | ICD-10-CM

## 2018-11-07 NOTE — Telephone Encounter (Signed)
-----   Message from Satira Sark, MD sent at 11/06/2018  3:12 PM EDT ----- Results reviewed.  Vascular studies are consistent with PAD as discussed at recent visit.  Please refer him to one of our vascular specialist, Dr. Gwenlyn Found or Dr. Fletcher Anon.

## 2018-11-07 NOTE — Telephone Encounter (Signed)
Pt aware and voiced understanding - referral placed - routed to pcp

## 2018-11-12 ENCOUNTER — Telehealth (HOSPITAL_COMMUNITY): Payer: Self-pay

## 2018-11-12 NOTE — Telephone Encounter (Signed)
Multiple attempts to reach patient via telephone unsuccessfully. Left a voicemail with my contact information and purpose for patient to return call at his convenience. Pt has a consult scheduled with Dr Alvester Chou tomorrow at 11:30 to discuss results of VAS US arterial duplex and options. Will continue to follow.

## 2018-11-13 ENCOUNTER — Encounter: Payer: Self-pay | Admitting: Cardiovascular Disease

## 2018-11-13 ENCOUNTER — Ambulatory Visit (INDEPENDENT_AMBULATORY_CARE_PROVIDER_SITE_OTHER): Payer: Medicare HMO | Admitting: Cardiovascular Disease

## 2018-11-13 ENCOUNTER — Other Ambulatory Visit: Payer: Self-pay

## 2018-11-13 VITALS — BP 153/72 | HR 78 | Ht 70.0 in | Wt 198.0 lb

## 2018-11-13 DIAGNOSIS — Z008 Encounter for other general examination: Secondary | ICD-10-CM

## 2018-11-13 DIAGNOSIS — I739 Peripheral vascular disease, unspecified: Secondary | ICD-10-CM | POA: Diagnosis not present

## 2018-11-13 DIAGNOSIS — Z01818 Encounter for other preprocedural examination: Secondary | ICD-10-CM | POA: Diagnosis not present

## 2018-11-13 NOTE — Progress Notes (Signed)
11/13/2018 Nicholas [name]   [birth year]  [id number]  Primary Physician Nicholas Burly, MD Primary Cardiologist: Nicholas Harp MD Nicholas [name], [place]  HPI:  Nicholas [name] is a 68 y.o. single Caucasian male without children who is referred by Dr. Domenic [name] for peripheral vascular valuation because of lifestyle and claudication.  He is tired from working in [employer].  His risk factors include treated hypertension and hyperlipidemia.  He continues to smoke a pack a day for which he is done for last 50 years recalcitrant to respect modification.  He has had a remote CVA 19 years ago and a recent myocardial infarction 01/14/2018 with PCI and drug-eluting stenting of his RCA and circumflex in a staged fashion 2 days apart by Nicholas [name].  He had recent Dopplers performed 11/06/2018 revealing a right ABI of 1.02 and a left 0.7 with an occluded left SFA.   Current Meds  Medication Sig  . amLODipine (NORVASC) 5 MG tablet Take 1 tablet (5 mg total) by mouth daily.  Nicholas [name] ELLIPTA 62.5-25 MCG/INH AEPB Take 1 puff by mouth every morning.  Marland Kitchen aspirin EC 81 MG tablet Take 1 tablet (81 mg total) by mouth daily.  Marland Kitchen atorvastatin (LIPITOR) 40 MG tablet TAKE 1 TABLET DAILY AT 6 PM  . bictegravir-emtricitabine-tenofovir AF (BIKTARVY) 50-200-25 MG TABS tablet Take 1 tablet by mouth daily.  . carvedilol (COREG) 3.125 MG tablet Take 1 tablet (3.125 mg total) by mouth 2 (two) times daily.  Marland Kitchen olmesartan (BENICAR) 40 MG tablet TK 1 T PO QD  . ticagrelor (BRILINTA) 90 MG TABS tablet Take 1 tablet (90 mg total) by mouth 2 (two) times daily.  . VENTOLIN HFA 108 (90 Base) MCG/ACT inhaler Take 1-2 puffs by mouth every 6 (six) hours as needed for shortness of breath or wheezing.     Allergies  Allergen Reactions  . Penicillins Anaphylaxis    Has patient had a PCN reaction causing immediate rash, facial/tongue/throat swelling, SOB or lightheadedness with hypotension: Yes Has patient  had a PCN reaction causing severe rash involving mucus membranes or skin necrosis: No Has patient had a PCN reaction that required hospitalization: No Has patient had a PCN reaction occurring within the last 10 years: No If all of the above answers are "NO", then may proceed with Cephalosporin use.     Social History   Socioeconomic History  . Marital status: Single    Spouse name: Not on file  . Number of children: Not on file  . Years of education: Not on file  . Highest education level: Not on file  Occupational History  . Not on file  Social Needs  . Financial resource strain: Not on file  . Food insecurity    Worry: Not on file    Inability: Not on file  . Transportation needs    Medical: Not on file    Non-medical: Not on file  Tobacco Use  . Smoking status: Current Every Day Smoker    Packs/day: 1.00    Types: Cigarettes  . Smokeless tobacco: Never Used  Substance and Sexual Activity  . Alcohol use: Not Currently  . Drug use: Yes    Types: Marijuana  . Sexual activity: Not Currently  Lifestyle  . Physical activity    Days per week: Not on file    Minutes per session: Not on file  . Stress: Not on file  Relationships  . Social connections    Talks  on phone: Not on file    Gets together: Not on file    Attends religious service: Not on file    Active member of club or organization: Not on file    Attends meetings of clubs or organizations: Not on file    Relationship status: Not on file  . Intimate partner violence    Fear of current or ex partner: Not on file    Emotionally abused: Not on file    Physically abused: Not on file    Forced sexual activity: Not on file  Other Topics Concern  . Not on file  Social History Narrative  . Not on file     Review of Systems: General: negative for chills, fever, night sweats or weight changes.  Cardiovascular: negative for chest pain, dyspnea on exertion, edema, orthopnea, palpitations, paroxysmal nocturnal  dyspnea or shortness of breath Dermatological: negative for rash Respiratory: negative for cough or wheezing Urologic: negative for hematuria Abdominal: negative for nausea, vomiting, diarrhea, bright red blood per rectum, melena, or hematemesis Neurologic: negative for visual changes, syncope, or dizziness All other systems reviewed and are otherwise negative except as noted above.    Blood pressure (!) 153/72, pulse 78, height 5\' 10"  (1.778 m), weight 198 lb (89.8 kg), SpO2 94 %.  General appearance: alert and no distress Neck: no adenopathy, no carotid bruit, no JVD, supple, symmetrical, trachea midline and thyroid not enlarged, symmetric, no tenderness/mass/nodules Lungs: clear to auscultation bilaterally Heart: regular rate and rhythm, S1, S2 normal, no murmur, click, rub or gallop Extremities: extremities normal, atraumatic, no cyanosis or edema Pulses: Absent left pedal pulse Skin: Skin color, texture, turgor normal. No rashes or lesions Neurologic: Alert and oriented X 3, normal strength and tone. Normal symmetric reflexes. Normal coordination and gait  EKG sinus rhythm at 78 without ST or T wave changes.  I personally reviewed this EKG.  ASSESSMENT AND PLAN:   Peripheral arterial disease [facility]) Nicholas [name] is a 68 year old gentleman patient of Dr. Myles [name] who was referred for evaluation of symptomatic PAD.  He is had left calf claudication for several months.  He has had recent Doppler studies performed 11/06/2018 revealing a right ABI of 1.02 and a left of 0.7 with what appears to be an occluded left SFA.  He wishes to proceed with outpatient endovascular therapy for lifestyle limiting claudication.      Nicholas Harp MD FACP,FACC,FAHA, [facility] 11/13/2018 12:07 PM

## 2018-11-13 NOTE — H&P (View-Only) (Signed)
11/13/2018 Nicholas [name]   Dec 11, 1950  [id number]  Primary Physician Nicholas Burly, MD Primary Cardiologist: Nicholas Harp MD Nicholas [name], [place]  HPI:  Nicholas [name] is a 68 y.o. single Caucasian male without children who is referred by Dr. Domenic [name] for peripheral vascular valuation because of lifestyle and claudication.  He is tired from working in [employer].  His risk factors include treated hypertension and hyperlipidemia.  He continues to smoke a pack a day for which he is done for last 50 years recalcitrant to respect modification.  He has had a remote CVA 19 years ago and a recent myocardial infarction 01/14/2018 with PCI and drug-eluting stenting of his RCA and circumflex in a staged fashion 2 days apart by Nicholas [name] and Nicholas [name].  He had recent Dopplers performed 11/06/2018 revealing a right ABI of 1.02 and a left 0.7 with an occluded left SFA.   Current Meds  Medication Sig  . amLODipine (NORVASC) 5 MG tablet Take 1 tablet (5 mg total) by mouth daily.  Nicholas [name] ELLIPTA 62.5-25 MCG/INH AEPB Take 1 puff by mouth every morning.  Nicholas [name] Kitchen aspirin EC 81 MG tablet Take 1 tablet (81 mg total) by mouth daily.  Nicholas [name] Kitchen atorvastatin (LIPITOR) 40 MG tablet TAKE 1 TABLET DAILY AT 6 PM  . bictegravir-emtricitabine-tenofovir AF (BIKTARVY) 50-200-25 MG TABS tablet Take 1 tablet by mouth daily.  . carvedilol (COREG) 3.125 MG tablet Take 1 tablet (3.125 mg total) by mouth 2 (two) times daily.  Nicholas [name] Kitchen olmesartan (BENICAR) 40 MG tablet TK 1 T PO QD  . ticagrelor (BRILINTA) 90 MG TABS tablet Take 1 tablet (90 mg total) by mouth 2 (two) times daily.  . VENTOLIN HFA 108 (90 Base) MCG/ACT inhaler Take 1-2 puffs by mouth every 6 (six) hours as needed for shortness of breath or wheezing.     Allergies  Allergen Reactions  . Penicillins Anaphylaxis    Has patient had a PCN reaction causing immediate rash, facial/tongue/throat swelling, SOB or lightheadedness with hypotension: Yes Has patient  had a PCN reaction causing severe rash involving mucus membranes or skin necrosis: No Has patient had a PCN reaction that required hospitalization: No Has patient had a PCN reaction occurring within the last 10 years: No If all of the above answers are "NO", then may proceed with Cephalosporin use.     Social History   Socioeconomic History  . Marital status: Single    Spouse name: Not on file  . Number of children: Not on file  . Years of education: Not on file  . Highest education level: Not on file  Occupational History  . Not on file  Social Needs  . Financial resource strain: Not on file  . Food insecurity    Worry: Not on file    Inability: Not on file  . Transportation needs    Medical: Not on file    Non-medical: Not on file  Tobacco Use  . Smoking status: Current Every Day Smoker    Packs/day: 1.00    Types: Cigarettes  . Smokeless tobacco: Never Used  Substance and Sexual Activity  . Alcohol use: Not Currently  . Drug use: Yes    Types: Marijuana  . Sexual activity: Not Currently  Lifestyle  . Physical activity    Days per week: Not on file    Minutes per session: Not on file  . Stress: Not on file  Relationships  . Social connections    Talks  on phone: Not on file    Gets together: Not on file    Attends religious service: Not on file    Active member of club or organization: Not on file    Attends meetings of clubs or organizations: Not on file    Relationship status: Not on file  . Intimate partner violence    Fear of current or ex partner: Not on file    Emotionally abused: Not on file    Physically abused: Not on file    Forced sexual activity: Not on file  Other Topics Concern  . Not on file  Social History Narrative  . Not on file     Review of Systems: General: negative for chills, fever, night sweats or weight changes.  Cardiovascular: negative for chest pain, dyspnea on exertion, edema, orthopnea, palpitations, paroxysmal nocturnal  dyspnea or shortness of breath Dermatological: negative for rash Respiratory: negative for cough or wheezing Urologic: negative for hematuria Abdominal: negative for nausea, vomiting, diarrhea, bright red blood per rectum, melena, or hematemesis Neurologic: negative for visual changes, syncope, or dizziness All other systems reviewed and are otherwise negative except as noted above.    Blood pressure (!) 153/72, pulse 78, height 5\' 10"  (1.778 m), weight 198 lb (89.8 kg), SpO2 94 %.  General appearance: alert and no distress Neck: no adenopathy, no carotid bruit, no JVD, supple, symmetrical, trachea midline and thyroid not enlarged, symmetric, no tenderness/mass/nodules Lungs: clear to auscultation bilaterally Heart: regular rate and rhythm, S1, S2 normal, no murmur, click, rub or gallop Extremities: extremities normal, atraumatic, no cyanosis or edema Pulses: Absent left pedal pulse Skin: Skin color, texture, turgor normal. No rashes or lesions Neurologic: Alert and oriented X 3, normal strength and tone. Normal symmetric reflexes. Normal coordination and gait  EKG sinus rhythm at 78 without ST or T wave changes.  I personally reviewed this EKG.  ASSESSMENT AND PLAN:   Peripheral arterial disease [facility]) Mr. Nicholas [name] is a 68 year old gentleman patient of Dr. Myles [name] who was referred for evaluation of symptomatic PAD.  He is had left calf claudication for several months.  He has had recent Doppler studies performed 11/06/2018 revealing a right ABI of 1.02 and a left of 0.7 with what appears to be an occluded left SFA.  He wishes to proceed with outpatient endovascular therapy for lifestyle limiting claudication.      Nicholas Harp MD FACP,FACC,FAHA, [facility] 11/13/2018 12:07 PM

## 2018-11-13 NOTE — Patient Instructions (Addendum)
Nicholas [name] Dept: [phone] Loc: [place]  11/13/2018  You are scheduled for a Peripheral Angiogram on Monday, October 12 with Dr. Quay Burow.  1. Please arrive at the [facility] (Main Entrance A) at [facility]: [address] [place], [place] [address] at 5:30 AM (This time is two hours before your procedure to ensure your preparation). Free valet parking service is available.   Special note: Every effort is made to have your procedure done on time. Please understand that emergencies sometimes delay scheduled procedures.  2. Diet: Do not eat solid foods after midnight.  The patient may have clear liquids until 5am upon the day of the procedure.  3. Labs:  You will need to have blood drawn on 11/28/2018. Go to:  [facility] at [address], [place], [place] [address] OR ANY [facility] NEAR YOU FOR YOUR BASIC METABOLIC PANEL, COMPLETE BLOOD COUNT, AND THYROID STIMULATING HORMONE LAB WORK. NO APPOINTMENT IS NEEDED. YOU MUST HAVE THIS LAB WORK DONE BEFORE GOING TO GET YOUR COVID-19 TEST DONE BECAUSE YOU WILL NEED TO SELF-ISOLATE AFTER THE COVID-19 TEST UNTIL THE DAY OF YOUR [place].  You will need to have a COVID-19 test on 11/28/2018. Your appointment is at 11:00am. Go to: [facility] (Across from [facility]) [address] [place], [place] [address] FOR YOUR COVID-19 TEST. YOU MUST HAVE YOUR COVID-19 TEST COMPLETED 4 DAYS PRIOR TO YOUR UPCOMING PROCEDURE/TEST. YOU WILL ALSO NEED TO QUARANTINE YOURSELF AFTER THE COVID-19 TEST UNTIL THE DAY OF YOUR PROCEDURE/TEST. PLEASE BRING YOUR I.D. AND YOUR INSURANCE CARD(S) WITH YOU.   4. Medication instructions in preparation for your procedure:   On the morning of your procedure, take your Aspirin and any morning  medicines NOT listed above.  You may use sips of water.  5. Plan for one night stay--bring personal belongings. 6. Bring a current list of your medications and current insurance cards. 7. You MUST have a responsible person to drive you home. 8. Someone MUST be with you the first 24 hours after you arrive home or your discharge will be delayed. 9. Please wear clothes that are easy to get on and off and wear slip-on shoes.  Thank you for allowing [place] to care for you!   -- [facility]    __________________________________________________________________   Testing: Your physician has requested that you have an ankle brachial index (ABI). During this test an ultrasound and blood pressure cuff are used to evaluate the arteries that supply the arms and legs with blood. Allow thirty minutes for this exam. There are no restrictions or special instructions. DUE AFTER YOUR PROCEDURE. TO BE SCHEDULED  Your physician has requested that you have an aorta/iliac duplex. During this test, an ultrasound is used to evaluate blood flow to the aorta and iliac arteries. Allow one hour for this exam. Do not eat after midnight the day before and avoid carbonated beverages. DUE AFTER YOUR PROCEDURE. TO BE SCHEDULED   Follow-Up: At [facility], you and your health needs are our priority.  As part of our continuing mission to provide you with exceptional heart care, we have created designated Provider Care Teams.  These Care Teams include your primary Cardiologist (physician) and Advanced Practice Providers (APPs -  Physician Assistants and Nurse Practitioners) who all work together to provide you with the  care you need, when you need it. You will need a follow up appointment with Dr. Quay Burow AFTER YOUR ULTRASOUNDS.  TO BE SCHEDULED

## 2018-11-13 NOTE — Assessment & Plan Note (Signed)
Nicholas [name] is a 68 year old gentleman patient of Dr. Myles Gip who was referred for evaluation of symptomatic PAD.  He is had left calf claudication for several months.  He has had recent Doppler studies performed 11/06/2018 revealing a right ABI of 1.02 and a left of 0.7 with what appears to be an occluded left SFA.  He wishes to proceed with outpatient endovascular therapy for lifestyle limiting claudication.

## 2018-11-14 ENCOUNTER — Telehealth: Payer: Self-pay | Admitting: Cardiovascular Disease

## 2018-11-14 ENCOUNTER — Telehealth (HOSPITAL_COMMUNITY): Payer: Self-pay

## 2018-11-14 NOTE — Telephone Encounter (Signed)
Telephone call post appointment with Dr Alvester Chou to see if patient has any questions. No answer. Left a message. Pt returned phone call during documentation of this note and states he doesn't have any questions at this time and is aware of angiogram scheduled for 12/02/18. Patient also reports he scheduled his post angiogram appointment for 12/31/2018.

## 2018-11-14 NOTE — Telephone Encounter (Signed)
Pt informed of providers result & recommendations. Pt verbalized understanding. No further questions . ? ?

## 2018-11-14 NOTE — Telephone Encounter (Signed)
Left message for patient to call and schedule follow up appointment with Dr. Gwenlyn Found (post angiogram).--Appointment needs to be after 12/17/18

## 2018-11-14 NOTE — Telephone Encounter (Signed)
The question of swelling should be addressed either Dr. Domenic Polite or an APP.  I do not think it has anything to do with his PAD.

## 2018-11-14 NOTE — Telephone Encounter (Signed)
Returned call to pt he states that it feels like "bees are stinging his left foot" pain is 6.5/10, hard to walk with the pain it is numb and tingling it is red and swollen. Pt does have not have a water pill to take. Pt was here yesterday for an appt and his weight was 198, he does not have a scale to weigh himself today. Please advise on what to do next?

## 2018-11-14 NOTE — Telephone Encounter (Signed)
° ° °  Patient calling to report pain and swelling in left foot

## 2018-11-22 ENCOUNTER — Other Ambulatory Visit: Payer: Self-pay | Admitting: *Deleted

## 2018-11-22 MED ORDER — ATORVASTATIN CALCIUM 40 MG PO TABS: 40.0000 mg | ORAL_TABLET | Freq: Every evening | ORAL | 3 refills | Status: DC

## 2018-11-28 ENCOUNTER — Telehealth: Payer: Self-pay | Admitting: *Deleted

## 2018-11-28 ENCOUNTER — Other Ambulatory Visit (HOSPITAL_COMMUNITY)
Admission: RE | Admit: 2018-11-28 | Discharge: 2018-11-28 | Disposition: A | Discharge status: Home / Self Care | Payer: Medicare HMO | Source: Ambulatory Visit | Attending: Cardiovascular Disease | Admitting: Cardiovascular Disease

## 2018-11-28 ENCOUNTER — Other Ambulatory Visit (HOSPITAL_COMMUNITY): Payer: Medicare HMO

## 2018-11-28 ENCOUNTER — Other Ambulatory Visit: Payer: Self-pay

## 2018-11-28 DIAGNOSIS — I739 Peripheral vascular disease, unspecified: Secondary | ICD-10-CM | POA: Diagnosis not present

## 2018-11-28 DIAGNOSIS — Z20828 Contact with and (suspected) exposure to other viral communicable diseases: Secondary | ICD-10-CM | POA: Insufficient documentation

## 2018-11-28 LAB — BASIC METABOLIC PANEL
Anion gap: 10 (ref 5–15)
BUN: 25 mg/dL — ABNORMAL HIGH (ref 8–23)
CO2: 21 mmol/L — ABNORMAL LOW (ref 22–32)
Calcium: 8.8 mg/dL — ABNORMAL LOW (ref 8.9–10.3)
Chloride: 108 mmol/L (ref 98–111)
Creatinine, Ser: 1.47 mg/dL — ABNORMAL HIGH (ref 0.61–1.24)
GFR calc Af Amer: 56 mL/min — ABNORMAL LOW (ref >60)
GFR calc non Af Amer: 48 mL/min — ABNORMAL LOW (ref >60)
Glucose, Bld: 131 mg/dL — ABNORMAL HIGH (ref 70–99)
Potassium: 4.7 mmol/L (ref 3.5–5.1)
Sodium: 139 mmol/L (ref 135–145)

## 2018-11-28 LAB — CBC
HCT: 55.6 % — ABNORMAL HIGH (ref 39.0–52.0)
Hemoglobin: 18.4 g/dL — ABNORMAL HIGH (ref 13.0–17.0)
MCH: 34.7 pg — ABNORMAL HIGH (ref 26.0–34.0)
MCHC: 33.1 g/dL (ref 30.0–36.0)
MCV: 104.9 fL — ABNORMAL HIGH (ref 80.0–100.0)
Platelets: 198 10*3/uL (ref 150–400)
RBC: 5.3 MIL/uL (ref 4.22–5.81)
RDW: 13.2 % (ref 11.5–15.5)
WBC: 9 10*3/uL (ref 4.0–10.5)
nRBC: 0 % (ref 0.0–0.2)

## 2018-11-28 LAB — SARS CORONAVIRUS 2 (TAT 6-24 HRS): SARS Coronavirus 2: NEGATIVE

## 2018-11-28 NOTE — Telephone Encounter (Addendum)
Pt contacted pre-abdominal aortogram scheduled at [facility] for: Monday December 02, 2018 7:30 AM Verified arrival time and place: [facility]) at: 5:30 AM   No solid food after midnight prior to cath, clear liquids until 5 AM day of procedure. Contrast allergy: no   AM meds can be  taken pre-cath with sip of water including: ASA 81 mg Brilinta 90 mg  Confirmed patient has responsible adult to drive home post procedure and observe 24 hours after arriving home  Currently, due to Covid-19 pandemic, only one support person will be allowed with patient. Must be the same support person for that patient's entire stay, will be screened and required to wear a mask. They will be asked to wait in the waiting room for the duration of the patient's stay.  Patients are required to wear a mask when they enter the hospital.      COVID-19 Pre-Screening Questions:  . In the past 7 to 10 days have you had a cough,  shortness of breath, headache, congestion, fever (100 or greater) body aches, chills, sore throat, or sudden loss of taste or sense of smell? no . Have you been around anyone with known Covid 19? no . Have you been around anyone who is awaiting Covid 19 test results in the past 7 to 10 days? no . Have you been around anyone who has been exposed to Covid 19, or has mentioned symptoms of Covid 19 within the past 7 to 10 days? no  I reviewed procedure/mask/visitor instructions,Covid-19 screening questions with patient, he verbalized understanding, thanked me for call.

## 2018-11-28 NOTE — Telephone Encounter (Signed)
Copied from [facility] results done 11/28/18: Notes recorded by Lorretta Harp, MD on 11/28/2018 at 1:56 PM EDT  Serum creatinine moderately elevated. He will need to stop his olmesartan to 3 days before the procedure and come in early for hydration.  I spoke with Geraldine Solar at [facility] lab to see about arrangements for hydration. Due to Dr Kennon Holter PV schedule on 12/02/18, she asked me to forward to Dr Gwenlyn Found his nurse to determine arrangements/time for hydration on 12/02/18. I have spoken to patient about lab results  and he knows to expect follow up call about this tomorrow. Pt states he has not taken olmesartan since November last year. I will forward this message to Dr Marlowe Sax for review and follow up with patient.

## 2018-11-29 NOTE — Telephone Encounter (Signed)
In-person consult with Dr. Gwenlyn Found. Per Dr. Gwenlyn Found, if pt has not been on olmesartan since Nov 2019 he does not need to continue med. He also stated pt will still need pre-hydration for 10/12 procedure.  Contacted [place] [facility] lab and spoke with personnel who stated that pt unable to come in earlier than 5:30 am for pre-hydration and Dr. Gwenlyn Found to determine if he wants pt to be admitted on Sunday for pre-hydration or have pt come in at 5:30 am and have procedure start time moved to 1:00pm. Routed to Dr. Gwenlyn Found to advise

## 2018-11-29 NOTE — Telephone Encounter (Signed)
IN-PERSON CONSULT WITH DR. Gwenlyn Found.  Confirmed with Dr. Gwenlyn Found that pt has not been taking olmesartan since Nov 2019. Dr. Gwenlyn Found reviewed pt lab results, including serum creatinine. Per Dr. Gwenlyn Found, pt does not need to go to hospital for pre-hydration and can be hydrated post-procedure.  Attempted call to pt. Left voicemail message regarding presenting for his upcoming procedure at 5:30am

## 2018-11-29 NOTE — Telephone Encounter (Signed)
This encounter was created in error - please disregard.

## 2018-12-01 ENCOUNTER — Other Ambulatory Visit: Payer: Self-pay

## 2018-12-02 ENCOUNTER — Encounter (HOSPITAL_COMMUNITY): Payer: Self-pay

## 2018-12-02 ENCOUNTER — Encounter (HOSPITAL_COMMUNITY)
Admission: RE | Disposition: A | Discharge status: Home / Self Care | Payer: Self-pay | Source: Home / Self Care | Attending: Cardiovascular Disease

## 2018-12-02 ENCOUNTER — Ambulatory Visit (HOSPITAL_COMMUNITY)
Admission: RE | Admit: 2018-12-02 | Discharge: 2018-12-03 | Disposition: A | Discharge status: Home / Self Care | Payer: Medicare HMO | Attending: Cardiovascular Disease | Admitting: Cardiovascular Disease

## 2018-12-02 ENCOUNTER — Other Ambulatory Visit: Payer: Self-pay

## 2018-12-02 DIAGNOSIS — I10 Essential (primary) hypertension: Secondary | ICD-10-CM | POA: Insufficient documentation

## 2018-12-02 DIAGNOSIS — F17210 Nicotine dependence, cigarettes, uncomplicated: Secondary | ICD-10-CM | POA: Diagnosis not present

## 2018-12-02 DIAGNOSIS — I70212 Atherosclerosis of native arteries of extremities with intermittent claudication, left leg: Secondary | ICD-10-CM | POA: Diagnosis not present

## 2018-12-02 DIAGNOSIS — Z880 Allergy status to penicillin: Secondary | ICD-10-CM | POA: Diagnosis not present

## 2018-12-02 DIAGNOSIS — E785 Hyperlipidemia, unspecified: Secondary | ICD-10-CM | POA: Diagnosis not present

## 2018-12-02 DIAGNOSIS — Z23 Encounter for immunization: Secondary | ICD-10-CM | POA: Insufficient documentation

## 2018-12-02 DIAGNOSIS — I252 Old myocardial infarction: Secondary | ICD-10-CM | POA: Diagnosis not present

## 2018-12-02 DIAGNOSIS — Z8673 Personal history of transient ischemic attack (TIA), and cerebral infarction without residual deficits: Secondary | ICD-10-CM | POA: Diagnosis not present

## 2018-12-02 DIAGNOSIS — Z79899 Other long term (current) drug therapy: Secondary | ICD-10-CM | POA: Insufficient documentation

## 2018-12-02 DIAGNOSIS — Z955 Presence of coronary angioplasty implant and graft: Secondary | ICD-10-CM | POA: Insufficient documentation

## 2018-12-02 DIAGNOSIS — I739 Peripheral vascular disease, unspecified: Secondary | ICD-10-CM | POA: Diagnosis present

## 2018-12-02 DIAGNOSIS — Z7982 Long term (current) use of aspirin: Secondary | ICD-10-CM | POA: Diagnosis not present

## 2018-12-02 HISTORY — PX: PERIPHERAL VASCULAR ATHERECTOMY: CATH118256

## 2018-12-02 HISTORY — PX: ABDOMINAL AORTOGRAM W/LOWER EXTREMITY: CATH118223

## 2018-12-02 HISTORY — PX: PERIPHERAL VASCULAR BALLOON ANGIOPLASTY: CATH118281

## 2018-12-02 LAB — POCT ACTIVATED CLOTTING TIME
Activated Clotting Time: 345 seconds
Activated Clotting Time: 230 seconds
Activated Clotting Time: 153 seconds

## 2018-12-02 SURGERY — ABDOMINAL AORTOGRAM W/LOWER EXTREMITY
Anesthesia: LOCAL | Laterality: Bilateral

## 2018-12-02 MED ORDER — TICAGRELOR 90 MG PO TABS
90.0000 mg | ORAL_TABLET | Freq: Two times a day (BID) | ORAL | Status: DC
  Administered 2018-12-02 – 2018-12-03 (×2): 90 mg via ORAL
  Filled 2018-12-02 (×2): qty 1

## 2018-12-02 MED ORDER — PNEUMOCOCCAL VAC POLYVALENT 25 MCG/0.5ML IJ INJ
0.5000 mL | INJECTION | INTRAMUSCULAR | Status: AC
  Administered 2018-12-03: 0.5 mL via INTRAMUSCULAR
  Filled 2018-12-02: qty 0.5

## 2018-12-02 MED ORDER — SODIUM CHLORIDE 0.9% FLUSH: 3.0000 mL | INTRAVENOUS | Status: DC | PRN

## 2018-12-02 MED ORDER — AMLODIPINE BESYLATE 5 MG PO TABS
5.0000 mg | ORAL_TABLET | Freq: Every day | ORAL | Status: DC
  Administered 2018-12-03: 5 mg via ORAL
  Filled 2018-12-02: qty 1

## 2018-12-02 MED ORDER — ASPIRIN EC 81 MG PO TBEC: 81.0000 mg | DELAYED_RELEASE_TABLET | Freq: Every day | ORAL | Status: DC

## 2018-12-02 MED ORDER — CARVEDILOL 3.125 MG PO TABS
3.1250 mg | ORAL_TABLET | Freq: Two times a day (BID) | ORAL | Status: DC
  Administered 2018-12-02 – 2018-12-03 (×2): 3.125 mg via ORAL
  Filled 2018-12-02 (×2): qty 1

## 2018-12-02 MED ORDER — HEPARIN SODIUM (PORCINE) 1000 UNIT/ML IJ SOLN
INTRAMUSCULAR | Status: DC | PRN
  Administered 2018-12-02: 9000 [IU] via INTRAVENOUS

## 2018-12-02 MED ORDER — ASPIRIN EC 81 MG PO TBEC
81.0000 mg | DELAYED_RELEASE_TABLET | Freq: Every day | ORAL | Status: DC
  Administered 2018-12-03: 81 mg via ORAL
  Filled 2018-12-02: qty 1

## 2018-12-02 MED ORDER — ONDANSETRON HCL 4 MG/2ML IJ SOLN: 4.0000 mg | Freq: Four times a day (QID) | INTRAMUSCULAR | Status: DC | PRN

## 2018-12-02 MED ORDER — LIDOCAINE HCL (PF) 1 % IJ SOLN
INTRAMUSCULAR | Status: AC
  Filled 2018-12-02: qty 30

## 2018-12-02 MED ORDER — ACETAMINOPHEN 325 MG PO TABS: 650.0000 mg | ORAL_TABLET | ORAL | Status: DC | PRN

## 2018-12-02 MED ORDER — MORPHINE SULFATE (PF) 2 MG/ML IV SOLN
2.0000 mg | INTRAVENOUS | Status: DC | PRN
  Administered 2018-12-02 (×2): 2 mg via INTRAVENOUS
  Filled 2018-12-02: qty 1

## 2018-12-02 MED ORDER — ALBUTEROL SULFATE (2.5 MG/3ML) 0.083% IN NEBU: 2.5000 mg | INHALATION_SOLUTION | RESPIRATORY_TRACT | Status: DC | PRN

## 2018-12-02 MED ORDER — SODIUM CHLORIDE 0.9% FLUSH
3.0000 mL | Freq: Two times a day (BID) | INTRAVENOUS | Status: DC
  Administered 2018-12-02: 3 mL via INTRAVENOUS

## 2018-12-02 MED ORDER — SODIUM CHLORIDE 0.9 % WEIGHT BASED INFUSION: 1.0000 mL/kg/h | INTRAVENOUS | Status: DC

## 2018-12-02 MED ORDER — LIDOCAINE HCL (PF) 1 % IJ SOLN
INTRAMUSCULAR | Status: DC | PRN
  Administered 2018-12-02: 20 mL

## 2018-12-02 MED ORDER — SODIUM CHLORIDE 0.9 % IV SOLN: 250.0000 mL | INTRAVENOUS | Status: DC | PRN

## 2018-12-02 MED ORDER — SODIUM CHLORIDE 0.9 % WEIGHT BASED INFUSION
3.0000 mL/kg/h | INTRAVENOUS | Status: DC
  Administered 2018-12-02: 3 mL/kg/h via INTRAVENOUS

## 2018-12-02 MED ORDER — MIDAZOLAM HCL 2 MG/2ML IJ SOLN
INTRAMUSCULAR | Status: AC
  Filled 2018-12-02: qty 2

## 2018-12-02 MED ORDER — HEPARIN (PORCINE) IN NACL 1000-0.9 UT/500ML-% IV SOLN
INTRAVENOUS | Status: DC | PRN
  Administered 2018-12-02 (×2): 500 mL

## 2018-12-02 MED ORDER — SODIUM CHLORIDE 0.9% FLUSH: 3.0000 mL | Freq: Two times a day (BID) | INTRAVENOUS | Status: DC

## 2018-12-02 MED ORDER — HEPARIN SODIUM (PORCINE) 1000 UNIT/ML IJ SOLN
INTRAMUSCULAR | Status: AC
  Filled 2018-12-02: qty 1

## 2018-12-02 MED ORDER — MIDAZOLAM HCL 2 MG/2ML IJ SOLN
INTRAMUSCULAR | Status: DC | PRN
  Administered 2018-12-02: 1 mg via INTRAVENOUS

## 2018-12-02 MED ORDER — MORPHINE SULFATE (PF) 2 MG/ML IV SOLN
INTRAVENOUS | Status: AC
  Filled 2018-12-02: qty 1

## 2018-12-02 MED ORDER — HEPARIN (PORCINE) IN NACL 1000-0.9 UT/500ML-% IV SOLN
INTRAVENOUS | Status: AC
  Filled 2018-12-02: qty 1000

## 2018-12-02 MED ORDER — ALBUTEROL SULFATE HFA 108 (90 BASE) MCG/ACT IN AERS: 1.0000 | INHALATION_SPRAY | RESPIRATORY_TRACT | Status: DC | PRN

## 2018-12-02 MED ORDER — TICAGRELOR 90 MG PO TABS: 90.0000 mg | ORAL_TABLET | Freq: Once | ORAL | Status: DC

## 2018-12-02 MED ORDER — FENTANYL CITRATE (PF) 100 MCG/2ML IJ SOLN
INTRAMUSCULAR | Status: DC | PRN
  Administered 2018-12-02 (×2): 25 ug via INTRAVENOUS

## 2018-12-02 MED ORDER — LABETALOL HCL 5 MG/ML IV SOLN: 10.0000 mg | INTRAVENOUS | Status: DC | PRN

## 2018-12-02 MED ORDER — SODIUM CHLORIDE 0.9 % IV SOLN
INTRAVENOUS | Status: AC
  Administered 2018-12-02: 16:00:00 via INTRAVENOUS

## 2018-12-02 MED ORDER — INFLUENZA VAC A&B SA ADJ QUAD 0.5 ML IM PRSY
0.5000 mL | PREFILLED_SYRINGE | INTRAMUSCULAR | Status: AC
  Administered 2018-12-03: 0.5 mL via INTRAMUSCULAR
  Filled 2018-12-02: qty 0.5

## 2018-12-02 MED ORDER — ATORVASTATIN CALCIUM 40 MG PO TABS
40.0000 mg | ORAL_TABLET | Freq: Every evening | ORAL | Status: DC
  Administered 2018-12-02: 40 mg via ORAL
  Filled 2018-12-02: qty 1

## 2018-12-02 MED ORDER — ASPIRIN 81 MG PO CHEW: 81.0000 mg | CHEWABLE_TABLET | ORAL | Status: DC

## 2018-12-02 MED ORDER — HYDRALAZINE HCL 20 MG/ML IJ SOLN: 5.0000 mg | INTRAMUSCULAR | Status: DC | PRN

## 2018-12-02 MED ORDER — IODIXANOL 320 MG/ML IV SOLN
INTRAVENOUS | Status: DC | PRN
  Administered 2018-12-02: 135 mL

## 2018-12-02 MED ORDER — FENTANYL CITRATE (PF) 100 MCG/2ML IJ SOLN
INTRAMUSCULAR | Status: AC
  Filled 2018-12-02: qty 2

## 2018-12-02 SURGICAL SUPPLY — 26 items
BALLN COYOTE OTW 2.5X60X150 (BALLOONS) ×3
BALLN IN.PACT DCB 6X80 (BALLOONS) ×3
BALLOON COYOTE OTW 2.5X60X150 (BALLOONS) ×2 IMPLANT
CATH ANGIO 5F PIGTAIL 65CM (CATHETERS) ×3 IMPLANT
CATH CROSS OVER TEMPO 5F (CATHETERS) ×3 IMPLANT
CATH HAWKONE LX EXTENDED TIP (CATHETERS) ×3 IMPLANT
CATH STRAIGHT 5FR 65CM (CATHETERS) ×3 IMPLANT
CATH VIANCE CROSS STAND 150CM (MICROCATHETER) ×3
CATH VIANCE CROSS STD 150CM (MICROCATHETER) ×2 IMPLANT
DCB IN.PACT 6X80 (BALLOONS) ×2 IMPLANT
DEVICE CONTINUOUS FLUSH (MISCELLANEOUS) ×3 IMPLANT
DEVICE SPIDERFX EMB PROT 6MM (WIRE) ×3 IMPLANT
KIT ENCORE 26 ADVANTAGE (KITS) ×3 IMPLANT
KIT PV (KITS) ×3 IMPLANT
SHEATH HIGHFLEX ANSEL 7FR 55CM (SHEATH) ×3 IMPLANT
SHEATH PINNACLE 5F 10CM (SHEATH) ×3 IMPLANT
SHEATH PINNACLE 7F 10CM (SHEATH) ×3 IMPLANT
SHEATH PROBE COVER 6X72 (BAG) ×3 IMPLANT
STOPCOCK MORSE 400PSI 3WAY (MISCELLANEOUS) ×3 IMPLANT
SYR MEDRAD MARK 7 150ML (SYRINGE) ×3 IMPLANT
TAPE VIPERTRACK RADIOPAQ (MISCELLANEOUS) ×2 IMPLANT
TAPE VIPERTRACK RADIOPAQUE (MISCELLANEOUS) ×1
TRANSDUCER W/STOPCOCK (MISCELLANEOUS) ×3 IMPLANT
TRAY PV CATH (CUSTOM PROCEDURE TRAY) ×3 IMPLANT
WIRE HITORQ VERSACORE ST 145CM (WIRE) ×3 IMPLANT
WIRE SPARTACORE .014X300CM (WIRE) ×3 IMPLANT

## 2018-12-02 NOTE — Progress Notes (Signed)
Pt arrived from cath lab. Oriented to unit and room. CHG wipes applied. VSS. Call bell in reach. Will continue to monitor.   Arletta Bale, RN

## 2018-12-02 NOTE — Interval H&P Note (Signed)
History and Physical Interval Note:  12/02/2018 7:36 AM  Nicholas [name]  has presented today for surgery, with the diagnosis of PAD.  The various methods of treatment have been discussed with the patient and family. After consideration of risks, benefits and other options for treatment, the patient has consented to  Procedure(s): ABDOMINAL AORTOGRAM W/LOWER EXTREMITY (Bilateral) as a surgical intervention.  The patient's history has been reviewed, patient examined, no change in status, stable for surgery.  I have reviewed the patient's chart and labs.  Questions were answered to the patient's satisfaction.     Quay Burow

## 2018-12-02 NOTE — Progress Notes (Signed)
Site area: rt groin fa sheath pulled and pressure held by Margaretmary Dys Site Prior to Removal:  Level 1 Pressure Applied For: 30 minutes Manual:   yes Patient Status During Pull:  stable Post Pull Site:  Level 1 bruise, hematoma mostly resolved Post Pull Instructions Given:  yes Post Pull Pulses Present: rt dp dopplered Dressing Applied:  Gauze and tegaderm Bedrest begins @ 1130 Comments:

## 2018-12-03 ENCOUNTER — Encounter (HOSPITAL_COMMUNITY): Payer: Self-pay | Admitting: Cardiovascular Disease

## 2018-12-03 DIAGNOSIS — I252 Old myocardial infarction: Secondary | ICD-10-CM | POA: Diagnosis not present

## 2018-12-03 DIAGNOSIS — Z23 Encounter for immunization: Secondary | ICD-10-CM | POA: Diagnosis not present

## 2018-12-03 DIAGNOSIS — I10 Essential (primary) hypertension: Secondary | ICD-10-CM | POA: Diagnosis not present

## 2018-12-03 DIAGNOSIS — Z720 Tobacco use: Secondary | ICD-10-CM

## 2018-12-03 DIAGNOSIS — E785 Hyperlipidemia, unspecified: Secondary | ICD-10-CM | POA: Diagnosis not present

## 2018-12-03 DIAGNOSIS — Z79899 Other long term (current) drug therapy: Secondary | ICD-10-CM | POA: Diagnosis not present

## 2018-12-03 DIAGNOSIS — F17210 Nicotine dependence, cigarettes, uncomplicated: Secondary | ICD-10-CM | POA: Diagnosis not present

## 2018-12-03 DIAGNOSIS — I739 Peripheral vascular disease, unspecified: Secondary | ICD-10-CM | POA: Diagnosis not present

## 2018-12-03 DIAGNOSIS — Z7982 Long term (current) use of aspirin: Secondary | ICD-10-CM | POA: Diagnosis not present

## 2018-12-03 DIAGNOSIS — I70212 Atherosclerosis of native arteries of extremities with intermittent claudication, left leg: Secondary | ICD-10-CM | POA: Diagnosis not present

## 2018-12-03 DIAGNOSIS — Z880 Allergy status to penicillin: Secondary | ICD-10-CM | POA: Diagnosis not present

## 2018-12-03 LAB — BASIC METABOLIC PANEL
Anion gap: 10 (ref 5–15)
BUN: 25 mg/dL — ABNORMAL HIGH (ref 8–23)
CO2: 18 mmol/L — ABNORMAL LOW (ref 22–32)
Calcium: 8.4 mg/dL — ABNORMAL LOW (ref 8.9–10.3)
Chloride: 112 mmol/L — ABNORMAL HIGH (ref 98–111)
Creatinine, Ser: 1.29 mg/dL — ABNORMAL HIGH (ref 0.61–1.24)
GFR calc Af Amer: >60 mL/min (ref >60)
GFR calc non Af Amer: 57 mL/min — ABNORMAL LOW (ref >60)
Glucose, Bld: 102 mg/dL — ABNORMAL HIGH (ref 70–99)
Potassium: 3.7 mmol/L (ref 3.5–5.1)
Sodium: 140 mmol/L (ref 135–145)

## 2018-12-03 LAB — CBC
HCT: 46.7 % (ref 39.0–52.0)
Hemoglobin: 16.4 g/dL (ref 13.0–17.0)
MCH: 36 pg — ABNORMAL HIGH (ref 26.0–34.0)
MCHC: 35.1 g/dL (ref 30.0–36.0)
MCV: 102.6 fL — ABNORMAL HIGH (ref 80.0–100.0)
Platelets: 171 10*3/uL (ref 150–400)
RBC: 4.55 MIL/uL (ref 4.22–5.81)
RDW: 12.9 % (ref 11.5–15.5)
WBC: 10.3 10*3/uL (ref 4.0–10.5)
nRBC: 0 % (ref 0.0–0.2)

## 2018-12-03 NOTE — Discharge Summary (Addendum)
Discharge Summary    Patient ID: Nicholas [name],  MRN: [id number], DOB/AGE: 07/20/1950 68 y.o.  Admit date: 12/02/2018 Discharge date: 12/03/2018  Primary Care Provider: Stoney Bang A Primary Cardiologist: Rozann Lesches, MD, Dr. Gwenlyn Found ([place])  Discharge Diagnoses    Active Problems:   Peripheral arterial disease [facility])   Claudication in peripheral vascular disease (HCC)   Allergies Allergies  Allergen Reactions   Penicillins Anaphylaxis    High fever  Did it involve swelling of the face/tongue/throat, SOB, or low BP? No Did it involve sudden or severe rash/hives, skin peeling, or any reaction on the inside of your mouth or nose? No Did you need to seek medical attention at a hospital or doctor's office? Yes When did it last happen?45 + years If all above answers are "NO", may proceed with cephalosporin use.      Diagnostic Studies/Procedures    PV angiography: 12/02/18  Angiographic Data:   1: Abdominal aortic-the renal arteries widely patent.  The infrarenal abdominal aorta had mild atherosclerotic changes. 2: Left lower extremity-short segment CTO mid left SFA with three-vessel runoff 3: Right lower extremity-40 to 50% proximal right external echo restenosis  IMPRESSION: Nicholas [name] has a short segment CTO mid left SFA responsible for his claudication.  We will proceed with directional atherectomy followed by drug-coated balloon angioplasty using distal protection  Final Impression: Successful [place] 1 directional arthrectomy followed by drug-coated balloon angioplasty of a short segment mid left SFA CTO using spider distal protection.  The patient was already on dual antiplatelet therapy including aspirin and Brilinta.  He will be hydrated overnight, discharged home in the morning.  We will get lower extremity arterial Doppler studies in our [facility] line office next week and I will see him back 2 to 3 weeks thereafter.  Nicholas [name]. MD,  [facility] 12/02/2018 9:15 AM _____________   History of Present Illness     Nicholas [name] is a 68 y.o. who was referred by Dr. Domenic Polite for peripheral vascular valuation because of lifestyle and claudication.  He is retired from working in [employer].  His risk factors included treated hypertension and hyperlipidemia.  He continued to smoke a pack a day for which he is done for last 50 years.  He had a remote CVA 19 years ago and a recent myocardial infarction 01/14/2018 with PCI and drug-eluting stenting of his RCA and circumflex in a staged fashion 2 days apart by Drs. End and Kelly.  He had recent Dopplers performed 11/06/2018 revealing a right ABI of 1.02 and a left 0.7 with an occluded left SFA. Given his symptoms and recent dopplers, he was set up for outpatient PV angiogram.    Hospital Course     Underwent PV angiogram with Dr. Gwenlyn Found noted above with successful [facility] 1 directional arthrectomy followed by drug-coated balloon angioplasty of a short segment mid left SFA CTO using spider distal protection. He will be continued on DAPT with ASA/Brilinta. Morning labs were stable. No complications noted overnight. Able to ambulate without difficulty.  General: Well developed, well nourished, male appearing in no acute distress. Head: Normocephalic, atraumatic.  Neck: Supple without bruits, no JVD. Lungs:  Resp regular and unlabored, CTA. Heart: RRR, S1, S2, no S3, S4, or murmur; no rub. Abdomen: Soft, non-tender, non-distended with normoactive bowel sounds. No hepatomegaly. No rebound/guarding. No obvious abdominal masses. Extremities: No clubbing, cyanosis, edema. Distal pedal pulses are 2+ bilaterally. Right femoral cath site stable mild bruising but no hematoma Neuro:  Alert and oriented X 3. Moves all extremities spontaneously. Psych: Normal affect.  Nicholas [name] was seen by Dr. Irish Lack and determined stable for discharge home. Follow up in the office has been arranged.  Medications are listed below.   _____________  Discharge Vitals Blood pressure (!) 145/70, pulse 87, temperature 98.3 F (36.8 C), temperature source Oral, resp. rate 16, height 5' 10.5" (1.791 m), weight 89.8 kg, SpO2 96 %.  Filed Weights   12/02/18 0556  Weight: 89.8 kg    Labs & Radiologic Studies    CBC Recent Labs    12/03/18 0300  WBC 10.3  HGB 16.4  HCT 46.7  MCV 102.6*  PLT [id number]   Basic Metabolic Panel Recent Labs    12/03/18 0300  NA 140  K 3.7  CL 112*  CO2 18*  GLUCOSE 102*  BUN 25*  CREATININE 1.29*  CALCIUM 8.4*   Liver Function Tests No results for input(s): AST, ALT, ALKPHOS, BILITOT, PROT, ALBUMIN in the last 72 hours. No results for input(s): LIPASE, AMYLASE in the last 72 hours. Cardiac Enzymes No results for input(s): CKTOTAL, CKMB, CKMBINDEX, TROPONINI in the last 72 hours. BNP Invalid input(s): POCBNP D-Dimer No results for input(s): DDIMER in the last 72 hours. Hemoglobin A1C No results for input(s): HGBA1C in the last 72 hours. Fasting Lipid Panel No results for input(s): CHOL, HDL, LDLCALC, TRIG, CHOLHDL, LDLDIRECT in the last 72 hours. Thyroid Function Tests No results for input(s): TSH, T4TOTAL, T3FREE, THYROIDAB in the last 72 hours.  Invalid input(s): FREET3 _____________  [place] Lower Ext Art Seg Multi (segmentals & [place])  Result Date: 11/07/2018 LOWER EXTREMITY DOPPLER STUDY Indications: Claudication, and Numbness in both feet. High Risk Factors: Hypertension, hyperlipidemia, current smoker, coronary artery                    disease. Other Factors: Patient complains of pain in both legs with walking, left geater                than right. He also has numbness in both feet, right greater than                left.  Performing Technologist: McFatter, Leafy Ro RVT  Examination Guidelines: A complete evaluation includes at minimum, Doppler waveform signals and systolic blood pressure reading at the level of bilateral brachial,  anterior tibial, and posterior tibial arteries, when vessel segments are accessible. Bilateral testing is considered an integral part of a complete examination. Photoelectric Plethysmograph (PPG) waveforms and toe systolic pressure readings are included as required and additional duplex testing as needed. Limited examinations for reoccurring indications may be performed as noted.  ABI Findings: +---------+------------------+-----+---------+--------+  Right     Rt Pressure (mmHg) Index Waveform  Comment   +---------+------------------+-----+---------+--------+  Brachial  153                      triphasic           +---------+------------------+-----+---------+--------+  CFA                                triphasic           +---------+------------------+-----+---------+--------+  Popliteal                          triphasic           +---------+------------------+-----+---------+--------+  ATA       156                0.96  triphasic           +---------+------------------+-----+---------+--------+  PTA       166                1.02  triphasic           +---------+------------------+-----+---------+--------+  PERO      151                0.93  triphasic           +---------+------------------+-----+---------+--------+  Great Toe                          Normal              +---------+------------------+-----+---------+--------+ +---------+------------------+-----+----------+-------+  Left      Lt Pressure (mmHg) Index Waveform   Comment  +---------+------------------+-----+----------+-------+  Brachial  162                      triphasic           +---------+------------------+-----+----------+-------+  CFA                                triphasic           +---------+------------------+-----+----------+-------+  Popliteal                          monophasic          +---------+------------------+-----+----------+-------+  ATA       114                0.70  monophasic           +---------+------------------+-----+----------+-------+  PTA       106                0.65  monophasic          +---------+------------------+-----+----------+-------+  PERO      105                0.65  monophasic          +---------+------------------+-----+----------+-------+  Great Toe 85                 0.52  Abnormal            +---------+------------------+-----+----------+-------+  See Arterial Duplex study  Summary: Right: Resting right ankle-brachial index is within normal range. No evidence of significant right lower extremity arterial disease. The right toe-brachial index is normal. Left: Resting left ankle-brachial index indicates moderate left lower extremity arterial disease. The left toe-brachial index is abnormal.  *See table(s) above for measurements and observations.  Electronically signed by Jenkins Rouge MD on 11/07/2018 at 7:54:00 AM.    Final    Vas [place] Lower Extremity Arterial Duplex  Result Date: 11/07/2018 LOWER EXTREMITY ARTERIAL DUPLEX STUDY Indications: Claudication, and Numbness in both feet. High Risk Factors: Hypertension, hyperlipidemia, current smoker. Other Factors: Patient complains of pain in both legs with walking, left geater                than right. He also has numbness in both feet, right greater than                left.  Current ABI: Right 1.02, Left 0.70 Performing Technologist: Jeneen Montgomery RDMS, RVT, RDCS  Examination Guidelines: A complete evaluation includes B-mode imaging, spectral Doppler, color Doppler, and power Doppler as needed of all accessible portions of each vessel. Bilateral testing is considered an integral part of a complete examination. Limited examinations for reoccurring indications may be performed as noted.  +-----------+--------+-----+---------------+---------+--------+  RIGHT       PSV cm/s Ratio Stenosis        Waveform  Comments  +-----------+--------+-----+---------------+---------+--------+  CFA Prox    209            50-74%  stenosis triphasic           +-----------+--------+-----+---------------+---------+--------+  CFA Mid     165            30-49% stenosis triphasic           +-----------+--------+-----+---------------+---------+--------+  CFA Distal  96                             triphasic           +-----------+--------+-----+---------------+---------+--------+  DFA         100                                                +-----------+--------+-----+---------------+---------+--------+  SFA Prox    111                            triphasic           +-----------+--------+-----+---------------+---------+--------+  SFA Mid     132                            triphasic           +-----------+--------+-----+---------------+---------+--------+  SFA Distal  127                            triphasic           +-----------+--------+-----+---------------+---------+--------+  POP Prox    163            30-49% stenosis triphasic           +-----------+--------+-----+---------------+---------+--------+  POP Distal  72                             triphasic           +-----------+--------+-----+---------------+---------+--------+  TP Trunk    70                             triphasic           +-----------+--------+-----+---------------+---------+--------+  ATA Prox    139                                                +-----------+--------+-----+---------------+---------+--------+  ATA Mid     60                                                 +-----------+--------+-----+---------------+---------+--------+  ATA Distal  79                                                 +-----------+--------+-----+---------------+---------+--------+  PTA Prox    92                                                 +-----------+--------+-----+---------------+---------+--------+  PTA Mid     86                                                 +-----------+--------+-----+---------------+---------+--------+  PTA Distal  73                                                  +-----------+--------+-----+---------------+---------+--------+  PERO Prox   62                                                 +-----------+--------+-----+---------------+---------+--------+  PERO Mid    32                                                 +-----------+--------+-----+---------------+---------+--------+  PERO Distal 29                                                 +-----------+--------+-----+---------------+---------+--------+  +-----------+--------+-----+---------------+----------+------------------------+  LEFT        PSV cm/s Ratio Stenosis        Waveform   Comments                  +-----------+--------+-----+---------------+----------+------------------------+  CFA Prox    207            50-74% stenosis triphasic                            +-----------+--------+-----+---------------+----------+------------------------+  CFA Mid     147                            triphasic                            +-----------+--------+-----+---------------+----------+------------------------+  CFA Distal  106                            triphasic                            +-----------+--------+-----+---------------+----------+------------------------+  DFA         183                            biphasic                             +-----------+--------+-----+---------------+----------+------------------------+  SFA Prox    86                             triphasic                            +-----------+--------+-----+---------------+----------+------------------------+  SFA Mid                    occluded        triphasic  Mid-distal occluded                                                              segment with                                                                     reconstitution distally   +-----------+--------+-----+---------------+----------+------------------------+  SFA Distal  54                             monophasic                            +-----------+--------+-----+---------------+----------+------------------------+  POP Prox    40                             monophasic                           +-----------+--------+-----+---------------+----------+------------------------+  POP Distal  38                             monophasic                           +-----------+--------+-----+---------------+----------+------------------------+  TP Trunk    38                             monophasic                           +-----------+--------+-----+---------------+----------+------------------------+  ATA Prox    39                                                                  +-----------+--------+-----+---------------+----------+------------------------+  ATA Mid     23                                                                  +-----------+--------+-----+---------------+----------+------------------------+  ATA Distal  30                                                                  +-----------+--------+-----+---------------+----------+------------------------+  PTA Prox    34                                                                  +-----------+--------+-----+---------------+----------+------------------------+  PTA Mid     48                                                                  +-----------+--------+-----+---------------+----------+------------------------+  PTA Distal  45                                                                  +-----------+--------+-----+---------------+----------+------------------------+  PERO Prox   24                                                                  +-----------+--------+-----+---------------+----------+------------------------+  PERO Mid    23                                                                  +-----------+--------+-----+---------------+----------+------------------------+  PERO Distal 15                                                                   +-----------+--------+-----+---------------+----------+------------------------+  Summary: Right: 50-74% stenosis noted in the common femoral artery. 30-49% stenosis noted in the popliteal artery. Left: 50-74% stenosis noted  in the common femoral artery. Total occlusion noted in the superficial femoral artery. Mid to distal occluded segment with reconstitution via collaterals distally. See Arterial Doppler Study.  See table(s) above for measurements and observations. Vascular consult recommended. Electronically signed by Jenkins Rouge MD on 11/07/2018 at 46:37:19 AM.    Final    Disposition   Pt is being discharged home today in good condition.  Follow-up Plans & Appointments    Follow-up Information    [facility] Follow up on 12/17/2018.   Specialty: Cardiology Why: at 10:30am for your follow up dopplers. Contact information: [address] [place] [phone]       Lorretta Harp, MD Follow up on 12/31/2018.   Specialties: Cardiology, Radiology Why: at 1:45pm for your follow up appt.  Contact information: [address] [place] [address] [phone]          Discharge Instructions    Diet - low sodium heart healthy   Complete by: As directed    Discharge instructions   Complete by: As directed    Groin Site Care Refer to this sheet in the next few weeks. These instructions provide you with information on caring for yourself after your procedure. Your caregiver may also give you more specific instructions. Your treatment has been planned according to current medical practices, but problems sometimes occur. Call your caregiver if you have any problems or questions after your procedure. HOME CARE INSTRUCTIONS You may shower 24 hours after the procedure. Remove the bandage (dressing) and gently wash the site with plain soap and water. Gently pat the site dry.  Do not apply powder or lotion to the site.  Do  not sit in a bathtub, swimming pool, or whirlpool for 5 to 7 days.  No bending, squatting, or lifting anything over 10 pounds (4.5 kg) as directed by your caregiver.  Inspect the site at least twice daily.  Do not drive home if you are discharged the same day of the procedure. Have someone else drive you.  You may drive 24 hours after the procedure unless otherwise instructed by your caregiver.  What to expect: Any bruising will usually fade within 1 to 2 weeks.  Blood that collects in the tissue (hematoma) may be painful to the touch. It should usually decrease in size and tenderness within 1 to 2 weeks.  SEEK IMMEDIATE MEDICAL CARE IF: You have unusual pain at the groin site or down the affected leg.  You have redness, warmth, swelling, or pain at the groin site.  You have drainage (other than a small amount of blood on the dressing).  You have chills.  You have a fever or persistent symptoms for more than 72 hours.  You have a fever and your symptoms suddenly get worse.  Your leg becomes pale, cool, tingly, or numb.  You have heavy bleeding from the site. Hold pressure on the site. Marland Kitchen  PLEASE DO NOT MISS ANY DOSES OF YOUR BRILINTA!!!!! Also keep a log of you blood pressures and bring back to your follow up appt. Please call the office with any questions.   Patients taking blood thinners should generally stay away from medicines like ibuprofen, Advil, Motrin, naproxen, and Aleve due to risk of stomach bleeding. You may take Tylenol as directed or talk to your primary doctor about alternatives.   Increase activity slowly   Complete by: As directed        Discharge Medications  Medication List    TAKE these medications   amLODipine 5 MG tablet Commonly known as: NORVASC Take 1 tablet (5 mg total) by mouth daily.   Anoro Ellipta 62.5-25 MCG/INH Aepb Generic drug: umeclidinium-vilanterol Take 1 puff by mouth every morning.   aspirin EC 81 MG tablet Take 1 tablet (81 mg  total) by mouth daily.   atorvastatin 40 MG tablet Commonly known as: LIPITOR Take 1 tablet (40 mg total) by mouth every evening.   bictegravir-emtricitabine-tenofovir AF 50-200-25 MG Tabs tablet Commonly known as: BIKTARVY Take 1 tablet by mouth daily.   carvedilol 3.125 MG tablet Commonly known as: COREG Take 1 tablet (3.125 mg total) by mouth 2 (two) times daily.   ticagrelor 90 MG Tabs tablet Commonly known as: BRILINTA Take 1 tablet (90 mg total) by mouth 2 (two) times daily.   Ventolin HFA 108 (90 Base) MCG/ACT inhaler Generic drug: albuterol Take 1-2 puffs by mouth every 6 (six) hours as needed for shortness of breath or wheezing.        No                               Did the patient have a percutaneous coronary intervention (stent / angioplasty)?:  No.      Outstanding Labs/Studies   Follow up dopplers  Duration of Discharge Encounter   Greater than 30 minutes including physician time.  Signed, Reino Bellis NP-C 12/03/2018, 10:53 AM   I have examined the patient and reviewed assessment and plan and discussed with patient.  Agree with above as stated.    GEN: Well nourished, well developed, in no acute distress  HEENT: normal  Neck: no JVD, carotid bruits, or masses Cardiac: RRR; no murmurs, rubs, or gallops,no edema  Respiratory:  clear to auscultation bilaterally, normal work of breathing GI: soft, nontender, nondistended,  MS: no deformity or atrophy ; bruising at right groin; 2+ right PT pulse, 2+ left DP pulse Skin: warm and dry, no rash Neuro:  Strength and sensation are intact Psych: euthymic mood, full affect  He needs to stop smoking.  COntinue DAPT with secondary prevention.  OK for discharge.   Larae Grooms

## 2018-12-17 ENCOUNTER — Inpatient Hospital Stay (HOSPITAL_COMMUNITY): Admission: RE | Admit: 2018-12-17 | Payer: Medicare HMO | Source: Ambulatory Visit

## 2018-12-27 ENCOUNTER — Other Ambulatory Visit: Payer: Self-pay | Admitting: Internal Medicine

## 2018-12-27 ENCOUNTER — Other Ambulatory Visit: Payer: Self-pay | Admitting: Physician Assistant

## 2018-12-27 DIAGNOSIS — I10 Essential (primary) hypertension: Secondary | ICD-10-CM

## 2018-12-31 ENCOUNTER — Ambulatory Visit: Payer: Medicare HMO | Admitting: Cardiovascular Disease

## 2018-12-31 DIAGNOSIS — G459 Transient cerebral ischemic attack, unspecified: Secondary | ICD-10-CM | POA: Diagnosis not present

## 2018-12-31 DIAGNOSIS — J440 Chronic obstructive pulmonary disease with acute lower respiratory infection: Secondary | ICD-10-CM | POA: Diagnosis not present

## 2018-12-31 DIAGNOSIS — I10 Essential (primary) hypertension: Secondary | ICD-10-CM | POA: Diagnosis not present

## 2018-12-31 DIAGNOSIS — I2119 ST elevation (STEMI) myocardial infarction involving other coronary artery of inferior wall: Secondary | ICD-10-CM | POA: Diagnosis not present

## 2018-12-31 DIAGNOSIS — M10262 Drug-induced gout, left knee: Secondary | ICD-10-CM | POA: Diagnosis not present

## 2018-12-31 DIAGNOSIS — R159 Full incontinence of feces: Secondary | ICD-10-CM | POA: Diagnosis not present

## 2018-12-31 DIAGNOSIS — G622 Polyneuropathy due to other toxic agents: Secondary | ICD-10-CM | POA: Diagnosis not present

## 2018-12-31 DIAGNOSIS — B20 Human immunodeficiency virus [HIV] disease: Secondary | ICD-10-CM | POA: Diagnosis not present

## 2018-12-31 DIAGNOSIS — I739 Peripheral vascular disease, unspecified: Secondary | ICD-10-CM | POA: Diagnosis not present

## 2019-01-29 ENCOUNTER — Other Ambulatory Visit: Payer: Self-pay | Admitting: Internal Medicine

## 2019-02-18 ENCOUNTER — Other Ambulatory Visit: Payer: Self-pay | Admitting: Cardiology

## 2019-02-18 MED ORDER — TICAGRELOR 90 MG PO TABS: 90.0000 mg | ORAL_TABLET | Freq: Two times a day (BID) | ORAL | 3 refills | Status: DC

## 2019-02-18 NOTE — Telephone Encounter (Signed)
Patient notified via detailed voice message - no fax request received from pharmacy on him today.  Sending refill on the Brilinta now.

## 2019-02-18 NOTE — Telephone Encounter (Signed)
     1. Which medications need to be refilled? (please list name of each medication and dose if known)  (BRILINTA) 90 MG TABS   2. Which pharmacy/location (including street and city if local pharmacy) is medication to be sent to?   [facility], [place]  3. Do they need a 30 day or 90 day supply?   Patient states that [facility] faxed over request but never heard from [place].

## 2019-03-28 ENCOUNTER — Telehealth: Payer: Self-pay | Admitting: Pharmacy Technician

## 2019-03-28 NOTE — Telephone Encounter (Signed)
[facility] Patient Advocate Encounter  Received notification from [facility] that patient has been denied enrollment into their program to receive Biktavry from the drug manufacturer.      The company is requesting more proof of his income and ssn. Will communicate to the patient. This application was intended to cover him until SPAP was approved. He currently has 1 tablet remaining and the [employer] copay is currently [facility] will continue to follow.  Bartholomew Crews, CPhT Specialty Pharmacy Patient [facility] Phone: [phone] Fax: [phone] 03/28/2019 11:27 AM

## 2019-03-31 ENCOUNTER — Ambulatory Visit: Payer: Medicare HMO

## 2019-03-31 ENCOUNTER — Other Ambulatory Visit: Payer: Self-pay

## 2019-04-04 ENCOUNTER — Telehealth: Payer: Self-pay | Admitting: Cardiology

## 2019-04-04 NOTE — Telephone Encounter (Addendum)
[facility] Patient Advocate Encounter   I was successful in securing patient a $7500 grant from [employer] to provide copayment coverage for Biktarvy. This will make the out of pocket cost $0.     I have spoken with the patient and he will pick up at [facility].    The billing information is RxBin: [id number] PCN: [id number] Member ID: [id number] Group ID: [id number] Dates of Eligibility: 04/04/2019 through 02/20/2020    Patient knows to call the office with questions or concerns. Further income documentation is required to be faxed [phone]. His SSI letter was faxed to [employer].  Nicholas [name], CPhT Specialty Pharmacy Patient [name] Phone: [phone] Fax: [phone] 04/04/2019 9:38 AM

## 2019-04-04 NOTE — Telephone Encounter (Signed)
We are recommending the COVID-19 vaccine to all of our patients. Cardiac medications (including blood thinners) should not deter anyone from being vaccinated and there is no need to hold any of those medications prior to vaccine administration.     Currently, there is a hotline to call (active 02/28/19) to schedule vaccination appointments as no walk-ins will be accepted.   Number: [phone].    If an appointment is not available please go to [email or web address] to sign up for notification when additional vaccine appointments are available.   If you have further questions or concerns about the vaccine process, please visit [email or web address] or contact your primary care physician.  Verbalized understanding  

## 2019-04-07 ENCOUNTER — Encounter (INDEPENDENT_AMBULATORY_CARE_PROVIDER_SITE_OTHER): Payer: Self-pay | Admitting: Gastroenterology

## 2019-04-21 ENCOUNTER — Telehealth: Payer: Self-pay | Admitting: Cardiology

## 2019-04-21 NOTE — Telephone Encounter (Signed)
[facility] Patient Advocate Encounter  Patient was unfortunately removed from [facility] program 04/05/2019. No one at [facility] could give me a good reason why. When I was transferred to the specialty department, no one answered.  In the process, the patient was approved SPAP. Case Number [id number]. He's going to call [facility] and get his refill.

## 2019-04-21 NOTE — Telephone Encounter (Signed)

## 2019-04-28 ENCOUNTER — Ambulatory Visit: Payer: Medicare HMO | Admitting: Internal Medicine

## 2019-04-29 DIAGNOSIS — Z23 Encounter for immunization: Secondary | ICD-10-CM | POA: Diagnosis not present

## 2019-05-08 ENCOUNTER — Telehealth (INDEPENDENT_AMBULATORY_CARE_PROVIDER_SITE_OTHER): Payer: Medicare HMO | Admitting: Cardiology

## 2019-05-08 ENCOUNTER — Encounter: Payer: Self-pay | Admitting: Cardiology

## 2019-05-08 VITALS — Ht 70.0 in | Wt 199.0 lb

## 2019-05-08 DIAGNOSIS — Z79899 Other long term (current) drug therapy: Secondary | ICD-10-CM

## 2019-05-08 DIAGNOSIS — I25119 Atherosclerotic heart disease of native coronary artery with unspecified angina pectoris: Secondary | ICD-10-CM

## 2019-05-08 DIAGNOSIS — R0602 Shortness of breath: Secondary | ICD-10-CM

## 2019-05-08 DIAGNOSIS — E782 Mixed hyperlipidemia: Secondary | ICD-10-CM

## 2019-05-08 DIAGNOSIS — I739 Peripheral vascular disease, unspecified: Secondary | ICD-10-CM | POA: Diagnosis not present

## 2019-05-08 DIAGNOSIS — I10 Essential (primary) hypertension: Secondary | ICD-10-CM

## 2019-05-08 MED ORDER — TICAGRELOR 90 MG PO TABS: 90.0000 mg | ORAL_TABLET | Freq: Two times a day (BID) | ORAL | 3 refills | Status: DC

## 2019-05-08 NOTE — Progress Notes (Signed)
Virtual Visit via Telephone Note   This visit type was conducted due to national recommendations for restrictions regarding the COVID-19 Pandemic (e.g. social distancing) in an effort to limit this patient's exposure and mitigate transmission in our community.  Due to his co-morbid illnesses, this patient is at least at moderate risk for complications without adequate follow up.  This format is felt to be most appropriate for this patient at this time.  The patient did not have access to video technology/had technical difficulties with video requiring transitioning to audio format only (telephone).  All issues noted in this document were discussed and addressed.  No physical exam could be performed with this format.  Please refer to the patient's chart for his  consent to telehealth for [facility].   The patient was identified using 2 identifiers.  Date:  05/08/2019   ID:  Nicholas [name], DOB 09-Jan-1951, MRN [id number]  Patient Location: Home Provider Location: Office  PCP:  Neale Burly, MD  Cardiologist:  Rozann Lesches, MD Electrophysiologist:  None   Evaluation Performed:  Follow-Up Visit  Chief Complaint:   Cardiac follow-up  History of Present Illness:    Nicholas [name] is a 69 y.o. male that I met in in July 2020.  We spoke by phone today.  From a cardiac perspective, he does not report any active angina symptoms, but states that he remains short of breath dating back to 2019 with ACS, has also had intermittent ankle swelling.  He also states that he has abdominal protuberance and swelling as well as difficulty controlling his bowels.  Apparently he was referred for GI consultation by PCP, but this was delayed.  He was seen by Dr. Gwenlyn Found in September of last year due to claudication and abnormal ABIs, predominantly left-sided with concern for occluded SFA.  He underwent angiography in October 2020 demonstrating short segment CTO of the mid left SFA with three-vessel runoff.   This was treated with directional atherectomy and drug-coated balloon angioplasty.  He does report improvement in claudication.  Follow-up lab work from July of last year showed LDL 44.  LFTs were normal at that time.  I reviewed his medications which are outlined below.  Current cardiac regimen includes aspirin, Norvasc, Lipitor, Coreg, and Brilinta.  Past Medical History:  Diagnosis Date  . CAD (coronary artery disease)    Overlapping DES x2 to the mid to distal circumflex extending into OM 3 01/14/2018, DES x2 to the mid to distal RCA 01/16/2018  . Essential hypertension   . Grover's disease 2017  . HIV (human immunodeficiency virus infection) ([place])   . Polycythemia 2017  . Seasonal allergies 2017  . ST elevation myocardial infarction (STEMI) of inferior wall [facility])    November 2019  . Stroke (cerebrum) [facility])    Past Surgical History:  Procedure Laterality Date  . ABDOMINAL AORTOGRAM W/LOWER EXTREMITY Bilateral 12/02/2018   Procedure: ABDOMINAL AORTOGRAM W/LOWER EXTREMITY;  Surgeon: Lorretta Harp, MD;  Location: [place];  Service: Cardiovascular;  Laterality: Bilateral;  . CORONARY STENT INTERVENTION N/A 01/16/2018   Procedure: CORONARY STENT INTERVENTION;  Surgeon: Troy Sine, MD;  Location: [place] [facility];  Service: Cardiovascular;  Laterality: N/A;  . CORONARY/GRAFT ACUTE MI REVASCULARIZATION N/A 01/14/2018   Procedure: Coronary/Graft Acute MI Revascularization;  Surgeon: Nelva Bush, MD;  Location: [place] [facility];  Service: Cardiovascular;  Laterality: N/A;  . LEFT HEART CATH N/A 01/16/2018   Procedure: Left Heart Cath;  Surgeon: Troy Sine,  MD;  Location: [place] [facility];  Service: Cardiovascular;  Laterality: N/A;  . LEFT HEART CATH AND CORONARY ANGIOGRAPHY N/A 01/14/2018   Procedure: LEFT HEART CATH AND CORONARY ANGIOGRAPHY;  Surgeon: Nelva Bush, MD;  Location: [place] [facility];  Service: Cardiovascular;  Laterality: N/A;  .  PERIPHERAL VASCULAR ATHERECTOMY  12/02/2018   Procedure: PERIPHERAL VASCULAR ATHERECTOMY;  Surgeon: Lorretta Harp, MD;  Location: [place] [facility];  Service: Cardiovascular;;  Left SFA  . PERIPHERAL VASCULAR BALLOON ANGIOPLASTY  12/02/2018   Procedure: PERIPHERAL VASCULAR BALLOON ANGIOPLASTY;  Surgeon: Lorretta Harp, MD;  Location: [place] [facility];  Service: Cardiovascular;;  Left SFA  . ULTRASOUND GUIDANCE FOR VASCULAR ACCESS  01/16/2018   Procedure: Ultrasound Guidance For Vascular Access;  Surgeon: Troy Sine, MD;  Location: [place] [facility];  Service: Cardiovascular;;     Current Meds  Medication Sig  . amLODipine (NORVASC) 5 MG tablet TAKE 1 TABLET BY MOUTH EVERY DAY  . ANORO ELLIPTA 62.5-25 MCG/INH AEPB Take 1 puff by mouth every morning.  Marland Kitchen aspirin EC 81 MG tablet Take 1 tablet (81 mg total) by mouth daily.  Marland Kitchen atorvastatin (LIPITOR) 40 MG tablet Take 1 tablet (40 mg total) by mouth every evening.  . bictegravir-emtricitabine-tenofovir AF (BIKTARVY) 50-200-25 MG TABS tablet Take 1 tablet by mouth daily.  . carvedilol (COREG) 3.125 MG tablet Take 1 tablet (3.125 mg total) by mouth 2 (two) times daily.  . ticagrelor (BRILINTA) 90 MG TABS tablet Take 1 tablet (90 mg total) by mouth 2 (two) times daily.  . VENTOLIN HFA 108 (90 Base) MCG/ACT inhaler Take 1-2 puffs by mouth every 6 (six) hours as needed for shortness of breath or wheezing.  . [DISCONTINUED] ticagrelor (BRILINTA) 90 MG TABS tablet Take 1 tablet (90 mg total) by mouth 2 (two) times daily.     Allergies:   Penicillins   ROS:   As outlined above.  No palpitations or syncope.  Prior CV studies:   The following studies were reviewed today:  Echocardiogram 01/15/2018: Study Conclusions  - Left ventricle: The cavity size was normal. There was mild concentric hypertrophy. Systolic function was normal. The estimated ejection fraction was in the range of 55% to 60%. Images were inadequate for LV wall  motion assessment. Features are consistent with a pseudonormal left ventricular filling pattern, with concomitant abnormal relaxation and increased filling pressure (grade 2 diastolic dysfunction). - Left atrium: The atrium was mildly dilated. - Right atrium: The atrium was mildly dilated. - Pulmonary arteries: Systolic pressure could not be accurately estimated. - Inferior vena cava: The vessel was mildly dilated.  Labs/Other Tests and Data Reviewed:    EKG:  An ECG dated 11/11/2018 was personally reviewed today and demonstrated:  Normal sinus rhythm.  Recent Labs: 12/03/2018: BUN 25; Creatinine, Ser 1.29; Hemoglobin 16.4; Platelets 171; Potassium 3.7; Sodium 140  July 2020: AST 20, ALT 28, cholesterol 135, triglycerides 243, HDL 42, LDL 44  Wt Readings from Last 3 Encounters:  05/08/19 199 lb (90.3 kg)  12/02/18 198 lb (89.8 kg)  11/13/18 198 lb (89.8 kg)     Objective:    Vital Signs:  Ht '5\' 10"'  (1.778 m)   Wt 199 lb (90.3 kg)   BMI 28.55 kg/m      ASSESSMENT & PLAN:    1.  CAD status post inferior wall infarct in November 2019.  He underwent placement of overlapping DES x2 to the mid and distal circumflex as well as  DES x2 to the mid and distal RCA.  He continues aspirin and Brilinta, also Norvasc, Coreg, and Lipitor.  No description of angina, but he states that he remains short of breath, has also had some leg swelling.  We will obtain a follow-up echocardiogram for reassessment of cardiac structure and function, he may need a diuretic.  Might consider taking him off Brilinta or switching him to Plavix.  Follow-up CBC and CMET as well.  Will review testing and determine follow-up plan.  2.  PAD as outlined above, status post intervention to short segment CTO of the mid left SFA by Dr. Gwenlyn Found in October of last year.  He reports improvement in claudication symptoms.  Continue antiplatelet therapy and statin.  3.  Mixed hyperlipidemia, remains on Lipitor.  Last lipid  panel showed improvement in LDL to 44 and normal LFTs.  4.  Patient description of abdominal fullness and also difficulty controlling bowels.  He already has a referral to GI made by PCP, we will asked to see if his visit can be moved up.   Time:   Today, I have spent 10 minutes with the patient with telehealth technology discussing the above problems.     Medication Adjustments/Labs and Tests Ordered: Current medicines are reviewed at length with the patient today.  Concerns regarding medicines are outlined above.   Tests Ordered: Orders Placed This Encounter  Procedures  . Comprehensive metabolic panel  . CBC  . ECHOCARDIOGRAM COMPLETE    Medication Changes: Meds ordered this encounter  Medications  . ticagrelor (BRILINTA) 90 MG TABS tablet    Sig: Take 1 tablet (90 mg total) by mouth 2 (two) times daily.    Dispense:  180 tablet    Refill:  3    Follow Up:  Test results and determine next step.   Signed, Rozann Lesches, MD  05/08/2019 9:03 AM    [place]

## 2019-05-08 NOTE — Patient Instructions (Addendum)
Medication Instructions:   Your physician recommends that you continue on your current medications as directed. Please refer to the Current Medication list given to you today.  Labwork:  Your physician recommends that you return for non-fasting lab work as soon as possible to check your CMET & CBC. This can be done at [facility] ([address]) or [facility]. No appointment necessary-Monday--Friday from 8:00 am - 4:00 pm  Testing/Procedures: Your physician has requested that you have an echocardiogram. Echocardiography is a painless test that uses sound waves to create images of your heart. It provides your doctor with information about the size and shape of your heart and how well your heart's chambers and valves are working. This procedure takes approximately one hour. There are no restrictions for this procedure.  Follow-Up:  Your physician recommends that you schedule a follow-up appointment in: pending test.   Any Other Special Instructions Will Be Listed Below (If Applicable).   [facility] was contacted about getting you a sooner appointment with Ellis Savage PA. They did not have any sooner appointments available but says that you were added to their cancellation list to be called if someone cancels.   If you need a refill on your cardiac medications before your next appointment, please call your pharmacy.

## 2019-05-09 ENCOUNTER — Telehealth: Payer: Self-pay | Admitting: *Deleted

## 2019-05-09 DIAGNOSIS — I10 Essential (primary) hypertension: Secondary | ICD-10-CM | POA: Diagnosis not present

## 2019-05-09 DIAGNOSIS — R0602 Shortness of breath: Secondary | ICD-10-CM | POA: Diagnosis not present

## 2019-05-09 DIAGNOSIS — Z79899 Other long term (current) drug therapy: Secondary | ICD-10-CM | POA: Diagnosis not present

## 2019-05-09 NOTE — Telephone Encounter (Signed)
Noted.  He has a known history of polycythemia.  Please make sure that the entire lab report goes to his PCP Dr. Sherrie Sport.

## 2019-05-09 NOTE — Telephone Encounter (Signed)
Labs scanned into chart and forwarded to pcp

## 2019-05-09 NOTE — Telephone Encounter (Signed)
Tina from [facility] lab are faxing over results for pt hgb 18.5

## 2019-05-13 ENCOUNTER — Other Ambulatory Visit: Payer: Self-pay

## 2019-05-13 ENCOUNTER — Ambulatory Visit (INDEPENDENT_AMBULATORY_CARE_PROVIDER_SITE_OTHER): Payer: Medicare HMO

## 2019-05-13 ENCOUNTER — Telehealth: Payer: Self-pay | Admitting: *Deleted

## 2019-05-13 DIAGNOSIS — R0602 Shortness of breath: Secondary | ICD-10-CM | POA: Diagnosis not present

## 2019-05-13 MED ORDER — ATORVASTATIN CALCIUM 20 MG PO TABS: 20.0000 mg | ORAL_TABLET | Freq: Every day | ORAL | 3 refills | Status: DC

## 2019-05-13 NOTE — Telephone Encounter (Signed)
Patient informed and verbalized understanding of plan. Request that atorvastatin rx be profiled since he has 40 mg and will break in half. Advised to contact pharmacy when he is ready to refill and advised not to call refill on old prescription. Copy sent to PCP

## 2019-05-13 NOTE — Telephone Encounter (Signed)
-----   Message from Satira Sark, MD sent at 05/09/2019  2:50 PM EDT ----- Results reviewed.  Renal function stable with creatinine 1.08 and potassium normal at 4.1.  AST 44 and ALT 60, both have increased somewhat since last assessment.  Would cut Lipitor down to 20 mg daily.  His hemoglobin is high at 18.5 and history includes polycythemia.  Please forward results to his PCP as he may need hematology consultation (if not already obtained).

## 2019-05-14 ENCOUNTER — Telehealth: Payer: Self-pay | Admitting: *Deleted

## 2019-05-14 MED ORDER — CLOPIDOGREL BISULFATE 75 MG PO TABS: 75.0000 mg | ORAL_TABLET | Freq: Every day | ORAL | 1 refills | Status: DC

## 2019-05-14 NOTE — Telephone Encounter (Signed)
-----   Message from Satira Sark, MD sent at 05/13/2019  4:52 PM EDT ----- Results reviewed.  LVEF is normal at 60 to [id number], mild diastolic dysfunction, normal RV contraction.  No significant valvular disease.  Let's switch from Brilinta to Plavix to see if this helps with any of his shortness of breath.  Schedule a 6-week office visit in [place], either me or [place].

## 2019-05-14 NOTE — Telephone Encounter (Signed)
Pt voiced understanding - scheduled with NP 4/26 - updated medication list and forwarded to pcp

## 2019-05-17 ENCOUNTER — Other Ambulatory Visit: Payer: Self-pay | Admitting: Internal Medicine

## 2019-05-17 DIAGNOSIS — B20 Human immunodeficiency virus [HIV] disease: Secondary | ICD-10-CM

## 2019-05-19 ENCOUNTER — Telehealth: Payer: Self-pay

## 2019-05-19 NOTE — Telephone Encounter (Signed)
Reached out to patient to reschedule missed appointment on 3/8. Patient states his sister who brings him to his appts has not been feeling well, and was unable to find other means of transportation. Was able to reschedule missed appt. Informed patient that if he needs assistance with transportation he could call office to see if qualifies for assistance.  Nicholas [name]

## 2019-05-20 ENCOUNTER — Telehealth: Payer: Self-pay | Admitting: Pharmacy Technician

## 2019-05-20 NOTE — Telephone Encounter (Signed)
[facility] Patient Advocate Encounter  Patient called this morning with concerns that his medication was having problems processing again this month. [facility] is unable to process the [employer] card for SPAP and it is still charging the $562 copay. When the pharmacist called [employer], they told her there has been a hold placed on his account and requires an authorized phone call or email to confirm next phase of processing. Will forward to financial and see if his card can be made active.   [employer] [phone]

## 2019-05-28 ENCOUNTER — Telehealth: Payer: Self-pay | Admitting: Pharmacy Technician

## 2019-05-28 NOTE — Telephone Encounter (Signed)
[facility] Patient Advocate Encounter  Mr. Rajkumar called stating he has not been able to get his medication.  I reached out to [facility] and learned that the prescription had been transferred to [facility], per his request (they were unsuccessful in getting SPAP to process, Ebony with [facility] stated they had tried to coach them through it, but it never worked).  I then called [facility] and they processed the medication and applied SPAP to cover the copay.  They had reached out to Mr. Timpe on April 1 without a return call.  I gave him the phone number and he is calling to set up shipment.   Venida Jarvis. Nadara Mustard Arnold Patient [facility] Phone: [phone] Fax:  [phone]

## 2019-06-12 ENCOUNTER — Other Ambulatory Visit: Payer: Self-pay

## 2019-06-12 ENCOUNTER — Ambulatory Visit (INDEPENDENT_AMBULATORY_CARE_PROVIDER_SITE_OTHER): Payer: Medicare HMO | Admitting: Internal Medicine

## 2019-06-12 ENCOUNTER — Encounter: Payer: Self-pay | Admitting: Internal Medicine

## 2019-06-12 DIAGNOSIS — E785 Hyperlipidemia, unspecified: Secondary | ICD-10-CM | POA: Diagnosis not present

## 2019-06-12 DIAGNOSIS — F17210 Nicotine dependence, cigarettes, uncomplicated: Secondary | ICD-10-CM

## 2019-06-12 DIAGNOSIS — B20 Human immunodeficiency virus [HIV] disease: Secondary | ICD-10-CM

## 2019-06-12 DIAGNOSIS — F330 Major depressive disorder, recurrent, mild: Secondary | ICD-10-CM

## 2019-06-12 DIAGNOSIS — I10 Essential (primary) hypertension: Secondary | ICD-10-CM | POA: Diagnosis not present

## 2019-06-12 DIAGNOSIS — Z79899 Other long term (current) drug therapy: Secondary | ICD-10-CM | POA: Diagnosis not present

## 2019-06-12 DIAGNOSIS — E782 Mixed hyperlipidemia: Secondary | ICD-10-CM | POA: Diagnosis not present

## 2019-06-12 NOTE — Assessment & Plan Note (Signed)
He has quit smoking multiple times in the past.  I encouraged him to go through the same process and quit again.

## 2019-06-12 NOTE — Assessment & Plan Note (Signed)
With the exception of that 2-week period recently his adherence has been excellent and his infection has been under very good, long-term control.  He will get repeat blood work today, continue Airline pilot and follow-up in 1 year.

## 2019-06-12 NOTE — Assessment & Plan Note (Signed)
He has had relapse of his depression and some anxiety precipitated by the Covid pandemic and the death of 2 sisters.  I asked if he would like to meet our behavioral health counselor but he declined the offer.

## 2019-06-12 NOTE — Progress Notes (Signed)
Patient Active Problem List   Diagnosis Date Noted  . Coronary artery disease involving native coronary artery of native heart with unstable angina pectoris ([place])     Priority: High  . HIV disease ([place]) 06/06/2017    Priority: High  . Claudication in peripheral vascular disease ([place]) 12/02/2018  . Peripheral arterial disease ([place]) 11/13/2018  . Hyperlipidemia LDL goal <70 01/17/2018  . STEMI (ST elevation myocardial infarction) ([place]) 01/14/2018  . STEMI involving left circumflex coronary artery ([place]) 01/14/2018  . Dyslipidemia 12/04/2017  . Cigarette smoker 06/07/2017  . HTN (hypertension) 06/07/2017  . History of CVA (cerebrovascular accident) 06/07/2017  . Seasonal allergies 06/07/2017  . Grover's disease 06/07/2017  . Depression 06/06/2017  . CKD (chronic kidney disease) stage 3, GFR 30-59 ml/min 06/06/2017  . Polycythemia 06/06/2017    Patient's Medications  New Prescriptions   No medications on file  Previous Medications   AMLODIPINE (NORVASC) 5 MG TABLET    TAKE 1 TABLET BY MOUTH EVERY DAY   ANORO ELLIPTA 62.5-25 MCG/INH AEPB    Take 1 puff by mouth every morning.   ASPIRIN EC 81 MG TABLET    Take 1 tablet (81 mg total) by mouth daily.   ATORVASTATIN (LIPITOR) 20 MG TABLET    Take 1 tablet (20 mg total) by mouth daily.   BIKTARVY 50-200-25 MG TABS TABLET    TAKE 1 TABLET BY MOUTH DAILY.   CARVEDILOL (COREG) 3.125 MG TABLET    Take 1 tablet (3.125 mg total) by mouth 2 (two) times daily.   CLOPIDOGREL (PLAVIX) 75 MG TABLET    Take 1 tablet (75 mg total) by mouth daily.   VENTOLIN HFA 108 (90 BASE) MCG/ACT INHALER    Take 1-2 puffs by mouth every 6 (six) hours as needed for shortness of breath or wheezing.  Modified Medications   No medications on file  Discontinued Medications   No medications on file    Subjective: Nicholas [name] is in for his routine HIV follow-up visit.  He was out of his [employer] [place] for about 2 weeks recently when the [facility] in [place],  [place] could not fill the prescription.  He talked our pharmacy staff and had his prescription transferred to the specially pharmacy that mails him his medication each month.  He has not missed any other doses.  He has largely been staying at home during the Covid pandemic.  He has received both doses of his Covid vaccine.  He says that he has been more anxious and depressed recently.  2 of his sisters died recently.  He tells me that caused him to go back to smoking about 1/2 pack of cigarettes daily.  He has not had any chest pain recently.  He has been bothered by abdominal bloating and intermittent constipation followed by intermittent diarrhea.  He has been taking Pepto-Bismol and Imodium as needed.  He is scheduled to be evaluated by gastroenterology in June.  Review of Systems: Review of Systems  Constitutional: Negative for fever and weight loss.  Respiratory: Positive for shortness of breath. Negative for cough.   Cardiovascular: Negative for chest pain.  Gastrointestinal: Positive for constipation and diarrhea. Negative for abdominal pain, heartburn, nausea and vomiting.  Psychiatric/Behavioral: Positive for depression. The patient is nervous/anxious.     Past Medical History:  Diagnosis Date  . CAD (coronary artery disease)    Overlapping DES x2 to the mid to distal circumflex extending into OM 3 01/14/2018,  DES x2 to the mid to distal RCA 01/16/2018  . Essential hypertension   . Grover's disease 2017  . HIV (human immunodeficiency virus infection) ([place])   . Polycythemia 2017  . Seasonal allergies 2017  . ST elevation myocardial infarction (STEMI) of inferior wall [facility])    November 2019  . Stroke (cerebrum) [facility])     Social History   Tobacco Use  . Smoking status: Current Every Day Smoker    Packs/day: 1.00    Types: Cigarettes    Start date: 04/30/1964  . Smokeless tobacco: Never Used  Substance Use Topics  . Alcohol use: Not Currently  . Drug use: Not  Currently    Types: Marijuana    Family History  Problem Relation Age of Onset  . Heart disease Mother     Allergies  Allergen Reactions  . Penicillins Anaphylaxis    High fever  Did it involve swelling of the face/tongue/throat, SOB, or low BP? No Did it involve sudden or severe rash/hives, skin peeling, or any reaction on the inside of your mouth or nose? No Did you need to seek medical attention at a hospital or doctor's office? Yes When did it last happen?45 + years If all above answers are "NO", may proceed with cephalosporin use.      Health Maintenance  Topic Date Due  . COVID-19 Vaccine (1) Never done  . TETANUS/TDAP  Never done  . COLONOSCOPY  Never done  . INFLUENZA VACCINE  09/21/2019  . PNA vac Low Risk Adult (2 of 2 - PCV13) 12/03/2019  . Hepatitis C Screening  Completed    Objective:  Vitals:   06/12/19 1455  BP: (!) 197/79  Pulse: (!) 104  Temp: 98.5 F (36.9 C)  SpO2: 95%  Weight: 206 lb (93.4 kg)  Height: 5\' 10"  (1.778 m)   Body mass index is 29.56 kg/m.  Physical Exam Constitutional:      Comments: He is in good spirits.  Cardiovascular:     Rate and Rhythm: Normal rate and regular rhythm.     Heart sounds: No murmur.  Pulmonary:     Effort: Pulmonary effort is normal.     Breath sounds: Normal breath sounds.  Abdominal:     General: There is distension.     Palpations: Abdomen is soft. There is no mass.     Tenderness: There is no abdominal tenderness.     Comments: His abdomen is symptomatic.  He has active bowel sounds.  Musculoskeletal:        General: No swelling or tenderness.  Skin:    Findings: No rash.  Neurological:     General: No focal deficit present.  Psychiatric:        Mood and Affect: Mood normal.     Lab Results Lab Results  Component Value Date   WBC 10.3 12/03/2018   HGB 16.4 12/03/2018   HCT 46.7 12/03/2018   MCV 102.6 (H) 12/03/2018   PLT 171 12/03/2018    Lab Results  Component Value Date    CREATININE 1.29 (H) 12/03/2018   BUN 25 (H) 12/03/2018   NA 140 12/03/2018   K 3.7 12/03/2018   CL 112 (H) 12/03/2018   CO2 18 (L) 12/03/2018    Lab Results  Component Value Date   ALT 13 04/23/2018   AST 10 04/23/2018   ALKPHOS 122 (H) 04/23/2018   BILITOT 0.9 04/23/2018    Lab Results  Component Value Date   CHOL 149 01/14/2018  HDL 25 (L) 01/14/2018   LDLCALC 108 (H) 01/14/2018   TRIG 81 01/14/2018   CHOLHDL 6.0 01/14/2018   Lab Results  Component Value Date   LABRPR NON-REACTIVE 05/23/2017   HIV 1 RNA Quant  Date Value  11/26/2017 <20 NOT DETECTED copies/mL  05/23/2017 <20 Copies/mL (H)   CD4 T Cell Abs (/uL)  Date Value  11/26/2017 980  05/23/2017 980     Problem List Items Addressed This Visit      High   HIV disease ([place])    With the exception of that 2-week period recently his adherence has been excellent and his infection has been under very good, long-term control.  He will get repeat blood work today, continue Airline pilot and follow-up in 1 year.      Relevant Orders   T-helper cell (CD4)- ([facility] clinic only)   HIV-1 RNA quant-no reflex-bld   CBC   Comprehensive metabolic panel   RPR   Lipid panel     Unprioritized   Depression    He has had relapse of his depression and some anxiety precipitated by the Covid pandemic and the death of 2 sisters.  I asked if he would like to meet our behavioral health counselor but he declined the offer.      Cigarette smoker    He has quit smoking multiple times in the past.  I encouraged him to go through the same process and quit again.           Michel Bickers, MD [facility] [phone] pager   [phone] cell 06/12/2019, 3:18 PM

## 2019-06-13 LAB — T-HELPER CELL (CD4) - (RCID CLINIC ONLY)
CD4 % Helper T Cell: 40 % (ref 33–65)
CD4 T Cell Abs: 725 /uL (ref 400–1790)

## 2019-06-14 LAB — HIV-1 RNA QUANT-NO REFLEX-BLD
HIV 1 RNA Quant: <20 copies/mL
HIV-1 RNA Quant, Log: <1.3 Log copies/mL

## 2019-06-14 LAB — COMPREHENSIVE METABOLIC PANEL
AG Ratio: 1.5 (calc) (ref 1.0–2.5)
ALT: 42 U/L (ref 9–46)
AST: 31 U/L (ref 10–35)
Albumin: 4 g/dL (ref 3.6–5.1)
Alkaline phosphatase (APISO): 92 U/L (ref 35–144)
BUN/Creatinine Ratio: 13 (calc) (ref 6–22)
BUN: 18 mg/dL (ref 7–25)
CO2: 24 mmol/L (ref 20–32)
Calcium: 8.7 mg/dL (ref 8.6–10.3)
Chloride: 108 mmol/L (ref 98–110)
Creat: 1.34 mg/dL — ABNORMAL HIGH (ref 0.70–1.25)
Globulin: 2.6 g/dL (calc) (ref 1.9–3.7)
Glucose, Bld: 192 mg/dL — ABNORMAL HIGH (ref 65–99)
Potassium: 3.8 mmol/L (ref 3.5–5.3)
Sodium: 143 mmol/L (ref 135–146)
Total Bilirubin: 0.6 mg/dL (ref 0.2–1.2)
Total Protein: 6.6 g/dL (ref 6.1–8.1)

## 2019-06-14 LAB — LIPID PANEL
Cholesterol: 137 mg/dL (ref <200)
HDL: 54 mg/dL (ref >=40)
LDL Cholesterol (Calc): 65 mg/dL (calc)
Non-HDL Cholesterol (Calc): 83 mg/dL (calc) (ref <130)
Total CHOL/HDL Ratio: 2.5 (calc) (ref <5.0)
Triglycerides: 94 mg/dL (ref <150)

## 2019-06-14 LAB — CBC
HCT: 52 % — ABNORMAL HIGH (ref 38.5–50.0)
Hemoglobin: 18.2 g/dL — ABNORMAL HIGH (ref 13.2–17.1)
MCH: 35.9 pg — ABNORMAL HIGH (ref 27.0–33.0)
MCHC: 35 g/dL (ref 32.0–36.0)
MCV: 102.6 fL — ABNORMAL HIGH (ref 80.0–100.0)
MPV: 10.3 fL (ref 7.5–12.5)
Platelets: 177 10*3/uL (ref 140–400)
RBC: 5.07 10*6/uL (ref 4.20–5.80)
RDW: 13.1 % (ref 11.0–15.0)
WBC: 8.6 10*3/uL (ref 3.8–10.8)

## 2019-06-14 LAB — RPR: RPR Ser Ql: NONREACTIVE

## 2019-06-15 ENCOUNTER — Other Ambulatory Visit: Payer: Self-pay | Admitting: Physician Assistant

## 2019-06-15 DIAGNOSIS — I10 Essential (primary) hypertension: Secondary | ICD-10-CM

## 2019-06-15 NOTE — Progress Notes (Signed)
Cardiology Office Note  Date: 06/16/2019   ID: Nicholas [name], DOB May 27, 1950, MRN [id number]  PCP:  Neale Burly, MD  Cardiologist:  Rozann Lesches, MD Electrophysiologist:  None   Chief Complaint:  F/U SOB, CAD, PAD, HLD, GI symptoms  History of Present Illness: Nicholas [name] is a 69 y.o. male with a history of SOB, CAD, PAD, HLD, GI symptoms.   Last seen by Dr. Domenic Polite via telemedicine 05/08/2019.  Patient did not report any anginal symptoms but stated he remained short of breath dating back to 2019 with ACS.  Also complained of intermittent ankle swelling.  Complained of abdominal protuberance and swelling as well as difficulty controlling his bowels.  He was referred to GI by PCP.  He has been seen by Dr. Alvester Chou in September last year due to claudication and abnormal ABI is predominantly left-sided.  He underwent angiography in October 2020 demonstrating short segment CTO of the mid left SFA with three-vessel runoff.  This was treated with directional atherectomy and drug coated balloon angioplasty.  He reported improvement in claudication-like symptoms.  Patient states he has been doing well without any significant exertional dyspnea, or anginal symptoms.  States he recently saw his HIV doctor who stated he was doing well.  Patient states he has been noticing his blood pressures have been up over the last month or so.  He has had no recent acute illnesses, hospitalizations, or travels.  He is received both doses of his maternal Covid vaccine.  He does complain of some lower extremity edema bilaterally.  He continues to smoke.  Past Medical History:  Diagnosis Date  . CAD (coronary artery disease)    Overlapping DES x2 to the mid to distal circumflex extending into OM 3 01/14/2018, DES x2 to the mid to distal RCA 01/16/2018  . Essential hypertension   . Grover's disease 2017  . HIV (human immunodeficiency virus infection) ([place])   . Polycythemia 2017  . Seasonal allergies 2017   . ST elevation myocardial infarction (STEMI) of inferior wall [facility])    November 2019  . Stroke (cerebrum) [facility])     Past Surgical History:  Procedure Laterality Date  . ABDOMINAL AORTOGRAM W/LOWER EXTREMITY Bilateral 12/02/2018   Procedure: ABDOMINAL AORTOGRAM W/LOWER EXTREMITY;  Surgeon: Lorretta Harp, MD;  Location: [place] [facility];  Service: Cardiovascular;  Laterality: Bilateral;  . CORONARY STENT INTERVENTION N/A 01/16/2018   Procedure: CORONARY STENT INTERVENTION;  Surgeon: Troy Sine, MD;  Location: [place] [facility];  Service: Cardiovascular;  Laterality: N/A;  . CORONARY/GRAFT ACUTE MI REVASCULARIZATION N/A 01/14/2018   Procedure: Coronary/Graft Acute MI Revascularization;  Surgeon: Nelva Bush, MD;  Location: [place] [facility];  Service: Cardiovascular;  Laterality: N/A;  . LEFT HEART CATH N/A 01/16/2018   Procedure: Left Heart Cath;  Surgeon: Troy Sine, MD;  Location: [place] [facility];  Service: Cardiovascular;  Laterality: N/A;  . LEFT HEART CATH AND CORONARY ANGIOGRAPHY N/A 01/14/2018   Procedure: LEFT HEART CATH AND CORONARY ANGIOGRAPHY;  Surgeon: Nelva Bush, MD;  Location: [place] [facility];  Service: Cardiovascular;  Laterality: N/A;  . PERIPHERAL VASCULAR ATHERECTOMY  12/02/2018   Procedure: PERIPHERAL VASCULAR ATHERECTOMY;  Surgeon: Lorretta Harp, MD;  Location: [place] [facility];  Service: Cardiovascular;;  Left SFA  . PERIPHERAL VASCULAR BALLOON ANGIOPLASTY  12/02/2018   Procedure: PERIPHERAL VASCULAR BALLOON ANGIOPLASTY;  Surgeon: Lorretta Harp, MD;  Location: [place] [facility];  Service: Cardiovascular;;  Left SFA  .  ULTRASOUND GUIDANCE FOR VASCULAR ACCESS  01/16/2018   Procedure: Ultrasound Guidance For Vascular Access;  Surgeon: Troy Sine, MD;  Location: [place] [facility];  Service: Cardiovascular;;    Current Outpatient Medications  Medication Sig Dispense Refill  . amLODipine (NORVASC) 5 MG tablet TAKE 1 TABLET  BY MOUTH EVERY DAY 30 tablet 6  . ANORO ELLIPTA 62.5-25 MCG/INH AEPB Take 1 puff by mouth every morning.  1  . aspirin EC 81 MG tablet Take 1 tablet (81 mg total) by mouth daily.    Marland Kitchen atorvastatin (LIPITOR) 20 MG tablet Take 1 tablet (20 mg total) by mouth daily. 90 tablet 3  . BIKTARVY 50-200-25 MG TABS tablet TAKE 1 TABLET BY MOUTH DAILY. 30 tablet 0  . clopidogrel (PLAVIX) 75 MG tablet Take 1 tablet (75 mg total) by mouth daily. 90 tablet 1  . VENTOLIN HFA 108 (90 Base) MCG/ACT inhaler Take 1-2 puffs by mouth every 6 (six) hours as needed for shortness of breath or wheezing.  5  . carvedilol (COREG) 6.25 MG tablet Take 1 tablet (6.25 mg total) by mouth 2 (two) times daily. 180 tablet 3  . hydrochlorothiazide (HYDRODIURIL) 25 MG tablet Take 1 tablet (25 mg total) by mouth daily. 90 tablet 0   No current facility-administered medications for this visit.   Allergies:  Penicillins   Social History: The patient  reports that he has been smoking cigarettes. He started smoking about 55 years ago. He has been smoking about 1.00 pack per day. He has never used smokeless tobacco. He reports previous alcohol use. He reports previous drug use. Drug: Marijuana.   Family History: The patient's family history includes Heart disease in his mother.   ROS:  Please see the history of present illness. Otherwise, complete review of systems is positive for none.  All other systems are reviewed and negative.   Physical Exam: VS:  BP (!) 196/84   Pulse 93   Ht 5\' 10"  (1.778 m)   Wt 199 lb (90.3 kg)   SpO2 96%   BMI 28.55 kg/m , BMI Body mass index is 28.55 kg/m.  Wt Readings from Last 3 Encounters:  06/16/19 199 lb (90.3 kg)  06/12/19 206 lb (93.4 kg)  05/08/19 199 lb (90.3 kg)    General: Patient appears comfortable at rest. Neck: Supple, no elevated JVP or carotid bruits, no thyromegaly. Lungs: Clear to auscultation, nonlabored breathing at rest. Cardiac: Regular rate and rhythm, no S3 or  significant systolic murmur, no pericardial rub. Extremities: No pitting edema, distal pulses 2+. Skin: Warm and dry. Musculoskeletal: No kyphosis. Neuropsychiatric: Alert and oriented x3, affect grossly appropriate.  ECG:  An ECG dated 06/16/2019 was personally reviewed today and demonstrated:  Sinus rhythm rate of 86.  No acute ST or T wave abnormalities noted,  Recent Labwork: 06/12/2019: ALT 42; AST 31; BUN 18; Creat 1.34; Hemoglobin 18.2; Platelets 177; Potassium 3.8; Sodium 143     Component Value Date/Time   CHOL 137 06/12/2019 1516   TRIG 94 06/12/2019 1516   HDL 54 06/12/2019 1516   CHOLHDL 2.5 06/12/2019 1516   VLDL 16 01/14/2018 0745   LDLCALC 65 06/12/2019 1516    Other Studies Reviewed Today:  Peripheral vascular catheterization 12/02/2018  Angiographic Data:   1: Abdominal aortic-the renal arteries widely patent.  The infrarenal abdominal aorta had mild atherosclerotic changes. 2: Left lower extremity-short segment CTO mid left SFA with three-vessel runoff 3: Right lower extremity-40 to 50% proximal right external echo restenosis  IMPRESSION: Mr. Schleifer has a short segment CTO mid left SFA responsible for his claudication.  We will proceed with directional atherectomy followed by drug-coated balloon angioplasty using distal protection  Final Impression: Successful Hawk 1 directional arthrectomy followed by drug-coated balloon angioplasty of a short segment mid left SFA CTO using spider distal protection.  The patient was already on dual antiplatelet therapy including aspirin and Brilinta.  He will be hydrated overnight, discharged home in the morning.  We will get lower extremity arterial Doppler studies in our [facility] line office next week and I will see him back 2 to 3 weeks thereafter.  Cardiac Catheterization 01/16/2018 [place] Free heart sounds are limiting Conclusion    Dist RCA lesion is 85% stenosed.  Prox RCA lesion is 20% stenosed.  Mid RCA lesion is 95%  stenosed.  Post intervention, there is a 0% residual stenosis.  Post intervention, there is a 0% residual stenosis.  A stent was successfully placed.  A stent was successfully placed.   Very difficult but ultimately successful PCI to the 95% mid RCA stenosis and the distal 85% RCA stenosis which appeared more significant since the initial evaluation.  A 2.25 x 15 mm Xience Sierra DES stent was inserted distally with post stent dilatation up to 2.6 mm.  A 2.5 x 15 mm Xience Sierra stent was inserted after much difficulty into the mid site with ultimate post stent dilatation up to 2.78 mm.  Post stent stenoses were reduced to 0%.  LVEDP 11 mmHg.  RECOMMENDATION: Continued medical therapy for concomitant CAD.  High potency statin therapy with target LDL less than 70.  Optimal blood pressure control with target blood pressure less than 130/80.  Post MI beta-blocker, ACE/ARB inhibition. Recommend uninterrupted dual antiplatelet therapy with Aspirin 81mg  daily and Ticagrelor 90mg  twice daily for a minimum of 12 months (ACS - Class I recommendation).  Diagnostic Dominance: Right  Intervention      Echocardiogram 05/13/2019 1. Left ventricular ejection fraction, by estimation, is 60 to 65%. The left ventricle has normal function. The left ventricle has no regional wall motion abnormalities. There is mild concentric left ventricular hypertrophy. Left ventricular diastolic parameters are consistent with Grade I diastolic dysfunction (impaired relaxation). 2. Right ventricular systolic function is normal. The right ventricular size is normal. There is normal pulmonary artery systolic pressure. 3. The mitral valve is grossly normal. Trivial mitral valve regurgitation. 4. The aortic valve is tricuspid. Aortic valve regurgitation is not visualized. No aortic stenosis is present. Comparison(s): Echocardiogram done 01/15/18 showed an EF of 60%.  Echocardiogram 01/15/2018: Study Conclusions -  Left ventricle: The cavity size was normal. There was mild concentric hypertrophy. Systolic function was normal. The estimated ejection fraction was in the range of 55% to 60%. Images were inadequate for LV wall motion assessment. Features are consistent with a pseudonormal left ventricular filling pattern, with concomitant abnormal relaxation and increased filling pressure (grade 2 diastolic dysfunction). - Left atrium: The atrium was mildly dilated. - Right atrium: The atrium was mildly dilated. - Pulmonary arteries: Systolic pressure could not be accurately estimated. - Inferior vena cava: The vessel was mildly dilated.  Assessment and Plan:  1. CAD in native artery   2. PAD (peripheral artery disease) ([place])   3. Mixed hyperlipidemia   4. Essential hypertension   5. Smoking    1. CAD in native artery Status post DES x2 to mid and distal RCA 2019.  Denies any progressive anginal or exertional symptoms.  Continue aspirin 81 mg daily,  Plavix 75 mg daily.  2. PAD (peripheral artery disease) ([place])  Successful Hawk 1 directional arthrectomy followed by drug-coated balloon angioplasty of a short segment mid left SFA CTO using spider distal protection.  Patient denies any recent claudication-like symptoms.   3. Mixed hyperlipidemia Recent lipid panel showed; TC 137, HDL 54, TG 94, LDL 65.  Continue atorvastatin 20 mg daily.  4.  Hypertension Patient is significantly hypertensive today with blood pressure of 196/84.  Heart rate is 93.  Patient states his blood pressure has been elevated over the last couple of months.  Increase Coreg to 6.25 mg p.o. twice daily, add HCTZ 25 mg p.o. daily.  Get a BMP and magnesium in 2 weeks.  Come back in 2 weeks with a log of blood pressures for nursing visit.  We may need to adjust medications at that time.  5.  Smoking Patient admits to continued smoking.  Encouraged cessation   Medication Adjustments/Labs and Tests Ordered: Current  medicines are reviewed at length with the patient today.  Concerns regarding medicines are outlined above.   Disposition: Follow-up with Dr Domenic Polite or APP 1 month Signed, Levell July, NP 06/16/2019 10:59 AM    [facility], [place], [place] [address] Phone: [phone]; Fax: [phone]

## 2019-06-16 ENCOUNTER — Encounter: Payer: Self-pay | Admitting: Family Medicine

## 2019-06-16 ENCOUNTER — Ambulatory Visit: Payer: Medicare HMO | Admitting: Family Medicine

## 2019-06-16 ENCOUNTER — Other Ambulatory Visit: Payer: Self-pay

## 2019-06-16 VITALS — BP 196/84 | HR 93 | Ht 70.0 in | Wt 199.0 lb

## 2019-06-16 DIAGNOSIS — I739 Peripheral vascular disease, unspecified: Secondary | ICD-10-CM | POA: Diagnosis not present

## 2019-06-16 DIAGNOSIS — I10 Essential (primary) hypertension: Secondary | ICD-10-CM

## 2019-06-16 DIAGNOSIS — I2510 Atherosclerotic heart disease of native coronary artery without angina pectoris: Secondary | ICD-10-CM | POA: Diagnosis not present

## 2019-06-16 DIAGNOSIS — F17200 Nicotine dependence, unspecified, uncomplicated: Secondary | ICD-10-CM

## 2019-06-16 DIAGNOSIS — IMO0001 Reserved for inherently not codable concepts without codable children: Secondary | ICD-10-CM

## 2019-06-16 DIAGNOSIS — E782 Mixed hyperlipidemia: Secondary | ICD-10-CM

## 2019-06-16 MED ORDER — HYDROCHLOROTHIAZIDE 25 MG PO TABS: 25.0000 mg | ORAL_TABLET | Freq: Every day | ORAL | 0 refills | Status: DC

## 2019-06-16 MED ORDER — CARVEDILOL 6.25 MG PO TABS: 6.2500 mg | ORAL_TABLET | Freq: Two times a day (BID) | ORAL | 3 refills | Status: DC

## 2019-06-16 NOTE — Patient Instructions (Addendum)
Medication Instructions:    Your physician has recommended you make the following change in your medication:   Increase carvedilol to 6.25 mg by mouth twice daily. You may take (2) of your 3.125 mg tablets twice daily until they are finished.  Start hydrochlorothiazide 25 mg by mouth daily  Continue other medications the same  Labwork:  Your physician recommends that you return for non-fasting lab work in: 2 weeks to check your BMET & Mg levels. You may have this done at [facility] No appointment is needed. (Monday-Friday from 7:30 am - 4:00 pm)  Testing/Procedures:  NONE  Follow-Up:  Your physician recommends that you schedule a follow-up appointment in: 1 month (office).  Your physician recommends that you schedule a follow-up appointment in: 2 weeks for a nurse visit. Please bring your home blood pressure readings to this visit.  Any Other Special Instructions Will Be Listed Below (If Applicable). Your physician has requested that you regularly monitor and record your blood pressure readings at home. Please use the same machine at the same time of day to check your readings and record them to bring to your follow-up visit.  If you need a refill on your cardiac medications before your next appointment, please call your pharmacy.

## 2019-06-24 ENCOUNTER — Other Ambulatory Visit: Payer: Self-pay | Admitting: Internal Medicine

## 2019-06-24 DIAGNOSIS — B20 Human immunodeficiency virus [HIV] disease: Secondary | ICD-10-CM

## 2019-06-26 DIAGNOSIS — Z880 Allergy status to penicillin: Secondary | ICD-10-CM | POA: Diagnosis not present

## 2019-06-26 DIAGNOSIS — I2510 Atherosclerotic heart disease of native coronary artery without angina pectoris: Secondary | ICD-10-CM | POA: Diagnosis not present

## 2019-06-26 DIAGNOSIS — I10 Essential (primary) hypertension: Secondary | ICD-10-CM | POA: Diagnosis not present

## 2019-06-26 DIAGNOSIS — G8929 Other chronic pain: Secondary | ICD-10-CM | POA: Diagnosis not present

## 2019-06-26 DIAGNOSIS — R519 Headache, unspecified: Secondary | ICD-10-CM | POA: Diagnosis not present

## 2019-06-26 DIAGNOSIS — R079 Chest pain, unspecified: Secondary | ICD-10-CM | POA: Diagnosis not present

## 2019-06-26 DIAGNOSIS — I739 Peripheral vascular disease, unspecified: Secondary | ICD-10-CM | POA: Diagnosis not present

## 2019-06-26 DIAGNOSIS — F17200 Nicotine dependence, unspecified, uncomplicated: Secondary | ICD-10-CM | POA: Diagnosis not present

## 2019-06-26 DIAGNOSIS — B20 Human immunodeficiency virus [HIV] disease: Secondary | ICD-10-CM | POA: Diagnosis not present

## 2019-06-27 ENCOUNTER — Encounter: Payer: Self-pay | Admitting: *Deleted

## 2019-06-29 NOTE — Progress Notes (Signed)
Cardiology Office Note  Date: 06/29/2019   ID: [name], DOB [birth year], MRN [id number]  PCP:  Neale Burly, MD  Cardiologist:  Rozann Lesches, MD Electrophysiologist:  None   Chief Complaint:  F/U SOB, CAD, PAD, HLD, GI symptoms  History of Present Illness: Buell Zerbe is a 69 y.o. male with a history of SOB, CAD, PAD, HLD, GI symptoms.   Last seen by Dr. Domenic Polite via telemedicine 05/08/2019.  Patient did not report any anginal symptoms but stated he remained short of breath dating back to 2019 with ACS.  Also complained of intermittent ankle swelling.  Complained of abdominal protuberance and swelling as well as difficulty controlling his bowels.  He was referred to GI by PCP.  He has been seen by Dr. Gwenlyn Found in September last year due to claudication and abnormal ABI is predominantly left-sided.  He underwent angiography in October 2020 demonstrating short segment CTO of the mid left SFA with three-vessel runoff.  This was treated with directional atherectomy and drug coated balloon angioplasty.  He reported improvement in claudication-like symptoms.  In the interim since last visit patient was sent to the emergency room on the day he had some lab work drawn on 06/26/2019.  His blood pressure was elevated in the 190s.  He had been complaining of a bifrontal headache for the previous 2 days.  He brings with him a log of blood pressures which show a systolic heart is running in the 160s 170s.  He denies any anginal or exertional symptoms, palpitations or arrhythmias, orthostatic symptoms.  Currently denies any headache.  States he is having some significant allergy symptoms affecting his eyes.    Past Medical History:  Diagnosis Date  . CAD (coronary artery disease)    Overlapping DES x2 to the mid to distal circumflex extending into OM 3 01/14/2018, DES x2 to the mid to distal RCA 01/16/2018  . Essential hypertension   . Grover's disease 2017  . HIV (human immunodeficiency virus  infection) ([place])   . Polycythemia 2017  . Seasonal allergies 2017  . ST elevation myocardial infarction (STEMI) of inferior wall [facility])    November 2019  . Stroke (cerebrum) [facility])     Past Surgical History:  Procedure Laterality Date  . ABDOMINAL AORTOGRAM W/LOWER EXTREMITY Bilateral 12/02/2018   Procedure: ABDOMINAL AORTOGRAM W/LOWER EXTREMITY;  Surgeon: Lorretta Harp, MD;  Location: [place] [facility];  Service: Cardiovascular;  Laterality: Bilateral;  . CORONARY STENT INTERVENTION N/A 01/16/2018   Procedure: CORONARY STENT INTERVENTION;  Surgeon: Troy Sine, MD;  Location: [place] [facility];  Service: Cardiovascular;  Laterality: N/A;  . CORONARY/GRAFT ACUTE MI REVASCULARIZATION N/A 01/14/2018   Procedure: Coronary/Graft Acute MI Revascularization;  Surgeon: Nelva Bush, MD;  Location: [place] [facility];  Service: Cardiovascular;  Laterality: N/A;  . LEFT HEART CATH N/A 01/16/2018   Procedure: Left Heart Cath;  Surgeon: Troy Sine, MD;  Location: [place] [facility];  Service: Cardiovascular;  Laterality: N/A;  . LEFT HEART CATH AND CORONARY ANGIOGRAPHY N/A 01/14/2018   Procedure: LEFT HEART CATH AND CORONARY ANGIOGRAPHY;  Surgeon: Nelva Bush, MD;  Location: [place] [facility];  Service: Cardiovascular;  Laterality: N/A;  . PERIPHERAL VASCULAR ATHERECTOMY  12/02/2018   Procedure: PERIPHERAL VASCULAR ATHERECTOMY;  Surgeon: Lorretta Harp, MD;  Location: [place] [facility];  Service: Cardiovascular;;  Left SFA  . PERIPHERAL VASCULAR BALLOON ANGIOPLASTY  12/02/2018   Procedure: PERIPHERAL VASCULAR BALLOON ANGIOPLASTY;  Surgeon: Quay Burow  J, MD;  Location: [place] [facility];  Service: Cardiovascular;;  Left SFA  . ULTRASOUND GUIDANCE FOR VASCULAR ACCESS  01/16/2018   Procedure: Ultrasound Guidance For Vascular Access;  Surgeon: Troy Sine, MD;  Location: [place] [facility];  Service: Cardiovascular;;    Current Outpatient Medications  Medication Sig  Dispense Refill  . amLODipine (NORVASC) 5 MG tablet TAKE 1 TABLET BY MOUTH EVERY DAY 30 tablet 6  . ANORO ELLIPTA 62.5-25 MCG/INH AEPB Take 1 puff by mouth every morning.  1  . aspirin EC 81 MG tablet Take 1 tablet (81 mg total) by mouth daily.    Marland Kitchen atorvastatin (LIPITOR) 20 MG tablet Take 1 tablet (20 mg total) by mouth daily. 90 tablet 3  . BIKTARVY 50-200-25 MG TABS tablet TAKE 1 TABLET BY MOUTH DAILY. 30 tablet 5  . carvedilol (COREG) 6.25 MG tablet Take 1 tablet (6.25 mg total) by mouth 2 (two) times daily. 180 tablet 3  . clopidogrel (PLAVIX) 75 MG tablet Take 1 tablet (75 mg total) by mouth daily. 90 tablet 1  . hydrochlorothiazide (HYDRODIURIL) 25 MG tablet Take 1 tablet (25 mg total) by mouth daily. 90 tablet 0  . VENTOLIN HFA 108 (90 Base) MCG/ACT inhaler Take 1-2 puffs by mouth every 6 (six) hours as needed for shortness of breath or wheezing.  5   No current facility-administered medications for this visit.   Allergies:  Penicillins   Social History: The patient  reports that he has been smoking cigarettes. He started smoking about 55 years ago. He has been smoking about 1.00 pack per day. He has never used smokeless tobacco. He reports previous alcohol use. He reports previous drug use. Drug: Marijuana.   Family History: The patient's family history includes Heart disease in his mother.   ROS:  Please see the history of present illness. Otherwise, complete review of systems is positive for none.  All other systems are reviewed and negative.   Physical Exam: VS:  There were no vitals taken for this visit., BMI There is no height or weight on file to calculate BMI.  Wt Readings from Last 3 Encounters:  06/16/19 199 lb (90.3 kg)  06/12/19 206 lb (93.4 kg)  05/08/19 199 lb (90.3 kg)    General: Patient appears comfortable at rest. Neck: Supple, no elevated JVP or carotid bruits, no thyromegaly. Lungs: Clear to auscultation, nonlabored breathing at rest. Cardiac: Regular rate  and rhythm, no S3 or significant systolic murmur, no pericardial rub. Extremities: No pitting edema, distal pulses 2+. Skin: Warm and dry. Musculoskeletal: No kyphosis. Neuropsychiatric: Alert and oriented x3, affect grossly appropriate.  ECG:  An ECG dated 06/16/2019 was personally reviewed today and demonstrated:  Sinus rhythm rate of 86.  No acute ST or T wave abnormalities noted,  Recent Labwork: 06/12/2019: ALT 42; AST 31; BUN 18; Creat 1.34; Hemoglobin 18.2; Platelets 177; Potassium 3.8; Sodium 143     Component Value Date/Time   CHOL 137 06/12/2019 1516   TRIG 94 06/12/2019 1516   HDL 54 06/12/2019 1516   CHOLHDL 2.5 06/12/2019 1516   VLDL 16 01/14/2018 0745   LDLCALC 65 06/12/2019 1516    Other Studies Reviewed Today:  Peripheral vascular catheterization 12/02/2018  Angiographic Data:   1: Abdominal aortic-the renal arteries widely patent.  The infrarenal abdominal aorta had mild atherosclerotic changes. 2: Left lower extremity-short segment CTO mid left SFA with three-vessel runoff 3: Right lower extremity-40 to 50% proximal right external echo restenosis  IMPRESSION:  Mr. Fulford has a short segment CTO mid left SFA responsible for his claudication.  We will proceed with directional atherectomy followed by drug-coated balloon angioplasty using distal protection  Final Impression: Successful Hawk 1 directional arthrectomy followed by drug-coated balloon angioplasty of a short segment mid left SFA CTO using spider distal protection.  The patient was already on dual antiplatelet therapy including aspirin and Brilinta.  He will be hydrated overnight, discharged home in the morning.  We will get lower extremity arterial Doppler studies in our [facility] line office next week and I will see him back 2 to 3 weeks thereafter.  Cardiac Catheterization 01/16/2018 [place] Free heart sounds are limiting Conclusion    Dist RCA lesion is 85% stenosed.  Prox RCA lesion is 20%  stenosed.  Mid RCA lesion is 95% stenosed.  Post intervention, there is a 0% residual stenosis.  Post intervention, there is a 0% residual stenosis.  A stent was successfully placed.  A stent was successfully placed.   Very difficult but ultimately successful PCI to the 95% mid RCA stenosis and the distal 85% RCA stenosis which appeared more significant since the initial evaluation.  A 2.25 x 15 mm Xience Sierra DES stent was inserted distally with post stent dilatation up to 2.6 mm.  A 2.5 x 15 mm Xience Sierra stent was inserted after much difficulty into the mid site with ultimate post stent dilatation up to 2.78 mm.  Post stent stenoses were reduced to 0%.  LVEDP 11 mmHg.  RECOMMENDATION: Continued medical therapy for concomitant CAD.  High potency statin therapy with target LDL less than 70.  Optimal blood pressure control with target blood pressure less than 130/80.  Post MI beta-blocker, ACE/ARB inhibition. Recommend uninterrupted dual antiplatelet therapy with Aspirin 81mg  daily and Ticagrelor 90mg  twice daily for a minimum of 12 months (ACS - Class I recommendation).  Diagnostic Dominance: Right  Intervention      Echocardiogram 05/13/2019 1. Left ventricular ejection fraction, by estimation, is 60 to 65%. The left ventricle has normal function. The left ventricle has no regional wall motion abnormalities. There is mild concentric left ventricular hypertrophy. Left ventricular diastolic parameters are consistent with Grade I diastolic dysfunction (impaired relaxation). 2. Right ventricular systolic function is normal. The right ventricular size is normal. There is normal pulmonary artery systolic pressure. 3. The mitral valve is grossly normal. Trivial mitral valve regurgitation. 4. The aortic valve is tricuspid. Aortic valve regurgitation is not visualized. No aortic stenosis is present. Comparison(s): Echocardiogram done 01/15/18 showed an EF of 60%.  Echocardiogram  01/15/2018: Study Conclusions - Left ventricle: The cavity size was normal. There was mild concentric hypertrophy. Systolic function was normal. The estimated ejection fraction was in the range of 55% to 60%. Images were inadequate for LV wall motion assessment. Features are consistent with a pseudonormal left ventricular filling pattern, with concomitant abnormal relaxation and increased filling pressure (grade 2 diastolic dysfunction). - Left atrium: The atrium was mildly dilated. - Right atrium: The atrium was mildly dilated. - Pulmonary arteries: Systolic pressure could not be accurately estimated. - Inferior vena cava: The vessel was mildly dilated.  Assessment and Plan:  No diagnosis found. 1. CAD in native artery Status post DES x2 to mid and distal RCA 2019.  Denies any progressive anginal or exertional symptoms.  Continue aspirin 81 mg daily, Plavix 75 mg daily.  2. PAD (peripheral artery disease) ([place])  Successful Hawk 1 directional arthrectomy followed by drug-coated balloon angioplasty of a short segment mid left  SFA CTO using spider distal protection.  Patient denies any recent claudication-like symptoms.   3. Mixed hyperlipidemia Recent lipid panel showed; TC 137, HDL 54, TG 94, LDL 65.  Continue atorvastatin 20 mg daily.  4.  Hypertension Blood pressure today 158/84.  Patient states his blood pressure has been elevated over the last couple of months.  Increase Coreg to 12.5 mg p.o. twice daily.Marland Kitchen twice daily, add HCTZ 25 mg p.o. daily.  We will follow-up in a month.  Advised patient to continue checking his blood pressure and bring a log of his blood pressures with him at next visit.  5.  Smoking Patient admits to continued smoking.  Encouraged cessation   Medication Adjustments/Labs and Tests Ordered: Current medicines are reviewed at length with the patient today.  Concerns regarding medicines are outlined above.   Disposition: Follow-up with Dr  Domenic Polite or APP 1 month Signed, Levell July, NP 06/29/2019 9:59 PM    [facility] [place], [place], [place] [address] Phone: [phone]; Fax: [phone]

## 2019-06-30 ENCOUNTER — Other Ambulatory Visit: Payer: Self-pay

## 2019-06-30 ENCOUNTER — Encounter: Payer: Self-pay | Admitting: Family Medicine

## 2019-06-30 ENCOUNTER — Ambulatory Visit: Payer: Medicare HMO | Admitting: Family Medicine

## 2019-06-30 VITALS — BP 158/84 | HR 99 | Ht 70.5 in | Wt 197.4 lb

## 2019-06-30 DIAGNOSIS — I2510 Atherosclerotic heart disease of native coronary artery without angina pectoris: Secondary | ICD-10-CM

## 2019-06-30 DIAGNOSIS — I10 Essential (primary) hypertension: Secondary | ICD-10-CM | POA: Diagnosis not present

## 2019-06-30 DIAGNOSIS — I739 Peripheral vascular disease, unspecified: Secondary | ICD-10-CM | POA: Diagnosis not present

## 2019-06-30 DIAGNOSIS — E782 Mixed hyperlipidemia: Secondary | ICD-10-CM | POA: Diagnosis not present

## 2019-06-30 DIAGNOSIS — F17200 Nicotine dependence, unspecified, uncomplicated: Secondary | ICD-10-CM

## 2019-06-30 MED ORDER — CARVEDILOL 12.5 MG PO TABS: 12.5000 mg | ORAL_TABLET | Freq: Two times a day (BID) | ORAL | 6 refills | Status: DC

## 2019-06-30 NOTE — Patient Instructions (Signed)
Medication Instructions:   Increase Coreg to 12.5mg  twice a day.  Continue all other medications.    Labwork: none  Testing/Procedures: none  Follow-Up: 1 month  Any Other Special Instructions Will Be Listed Below (If Applicable). Your physician has requested that you regularly monitor and record your blood pressure readings at home. Please take readings 3-4 x per week and bring them to your follow-up visit.  If you need a refill on your cardiac medications before your next appointment, please call your pharmacy.

## 2019-07-10 ENCOUNTER — Ambulatory Visit: Payer: Medicare HMO | Admitting: Cardiology

## 2019-07-15 ENCOUNTER — Ambulatory Visit (INDEPENDENT_AMBULATORY_CARE_PROVIDER_SITE_OTHER): Payer: Medicare HMO | Admitting: Gastroenterology

## 2019-07-16 ENCOUNTER — Encounter: Payer: Self-pay | Admitting: Internal Medicine

## 2019-07-24 ENCOUNTER — Ambulatory Visit (INDEPENDENT_AMBULATORY_CARE_PROVIDER_SITE_OTHER): Payer: Medicare HMO | Admitting: Gastroenterology

## 2019-07-31 ENCOUNTER — Ambulatory Visit: Payer: Medicare HMO | Admitting: Family Medicine

## 2019-09-03 ENCOUNTER — Encounter: Payer: Self-pay | Admitting: Internal Medicine

## 2019-09-03 ENCOUNTER — Telehealth: Payer: Self-pay

## 2019-09-03 NOTE — Telephone Encounter (Signed)
Received call today from [place], Pharmacist with [facility] regarding some concerns patient had a week ago.States that patient noticed a part of his abdomen was "bulding". Denied any pain or discomfort. Patient asked pharmacist if this could be related to [facility]. Advised pharmacy to have patient go to ED or Urgent care regarding concern. Tried to call patient regarding message from pharmacy. Unable to reach him at this time. Left voicemail with [facility] number requesting call back. [place]

## 2019-09-03 NOTE — Telephone Encounter (Signed)
Please offer him my first available open appointment.

## 2019-09-03 NOTE — Telephone Encounter (Signed)
Patient called office back regarding concerns. States that for the past two months he has had abdomen the size of a basketball. Patient believes this could be related to his medication. Has not followed up with PCP or Gastroenterology since concern has been going on. Advised patient to go to ED today to be evaluated.  Patient does have issues with transportation. Will reach out and see if someone can give him a ride. [place]

## 2019-09-04 ENCOUNTER — Ambulatory Visit: Payer: Medicare HMO

## 2019-09-04 ENCOUNTER — Other Ambulatory Visit: Payer: Self-pay

## 2019-09-09 ENCOUNTER — Ambulatory Visit: Payer: Medicare HMO | Admitting: Infectious Diseases

## 2019-09-25 ENCOUNTER — Other Ambulatory Visit: Payer: Self-pay | Admitting: Family Medicine

## 2019-09-25 MED ORDER — HYDROCHLOROTHIAZIDE 25 MG PO TABS: 25.0000 mg | ORAL_TABLET | Freq: Every day | ORAL | 1 refills | Status: DC

## 2019-09-25 NOTE — Telephone Encounter (Signed)
    1. Which medications need to be refilled? (please list name of each medication and dose if known)  hydrochlorothiazide (HYDRODIURIL) 25 MG tablet [[id number]] ENDED   2. Which pharmacy/location (including street and city if local pharmacy) is medication to be sent to? [facility], [place]  3. Do they need a 30 day or 90 day supply?

## 2019-09-25 NOTE — Telephone Encounter (Signed)
Done

## 2019-10-01 ENCOUNTER — Ambulatory Visit (INDEPENDENT_AMBULATORY_CARE_PROVIDER_SITE_OTHER): Payer: Medicare HMO | Admitting: Internal Medicine

## 2019-10-01 ENCOUNTER — Encounter: Payer: Self-pay | Admitting: Internal Medicine

## 2019-10-01 ENCOUNTER — Other Ambulatory Visit: Payer: Self-pay

## 2019-10-01 DIAGNOSIS — R6889 Other general symptoms and signs: Secondary | ICD-10-CM | POA: Diagnosis not present

## 2019-10-01 DIAGNOSIS — F330 Major depressive disorder, recurrent, mild: Secondary | ICD-10-CM

## 2019-10-01 DIAGNOSIS — B20 Human immunodeficiency virus [HIV] disease: Secondary | ICD-10-CM

## 2019-10-01 NOTE — Progress Notes (Signed)
Patient Active Problem List   Diagnosis Date Noted  . Coronary artery disease involving native coronary artery of native heart with unstable angina pectoris ([place])     Priority: High  . HIV disease ([place]) 06/06/2017    Priority: High  . Claudication in peripheral vascular disease ([place]) 12/02/2018  . Peripheral arterial disease ([place]) 11/13/2018  . Hyperlipidemia LDL goal <70 01/17/2018  . STEMI (ST elevation myocardial infarction) ([place]) 01/14/2018  . STEMI involving left circumflex coronary artery ([place]) 01/14/2018  . Dyslipidemia 12/04/2017  . Cigarette smoker 06/07/2017  . HTN (hypertension) 06/07/2017  . History of CVA (cerebrovascular accident) 06/07/2017  . Seasonal allergies 06/07/2017  . Grover's disease 06/07/2017  . Depression 06/06/2017  . CKD (chronic kidney disease) stage 3, GFR 30-59 ml/min 06/06/2017  . Polycythemia 06/06/2017    Patient's Medications  New Prescriptions   No medications on file  Previous Medications   AMLODIPINE (NORVASC) 5 MG TABLET    TAKE 1 TABLET BY MOUTH EVERY DAY   ANORO ELLIPTA 62.5-25 MCG/INH AEPB    Take 1 puff by mouth every morning.   ASPIRIN EC 81 MG TABLET    Take 1 tablet (81 mg total) by mouth daily.   ATORVASTATIN (LIPITOR) 20 MG TABLET    Take 1 tablet (20 mg total) by mouth daily.   BIKTARVY 50-200-25 MG TABS TABLET    TAKE 1 TABLET BY MOUTH DAILY.   CARVEDILOL (COREG) 12.5 MG TABLET    Take 1 tablet (12.5 mg total) by mouth 2 (two) times daily.   CLOPIDOGREL (PLAVIX) 75 MG TABLET    Take 1 tablet (75 mg total) by mouth daily.   GRAPE SEED EXTRACT PO    Take 2 tablets by mouth every morning.   HYDROCHLOROTHIAZIDE (HYDRODIURIL) 25 MG TABLET    Take 1 tablet (25 mg total) by mouth daily.   OXYCODONE-ACETAMINOPHEN (PERCOCET) 5-325 MG TABLET    Take 1 tablet by mouth as needed for severe pain (back pain).   VENTOLIN HFA 108 (90 BASE) MCG/ACT INHALER    Take 1-2 puffs by mouth every 6 (six) hours as needed for shortness of  breath or wheezing.  Modified Medications   No medications on file  Discontinued Medications   No medications on file    Subjective: Nicholas [name] is in for his routine HIV follow-up visit.  He denies any problems obtaining, taking or tolerating his Biktarvy.  It is mailed to him each month.  He takes it each morning.  He has been staying at home throughout the pandemic.  People at his apartment are not allowed to congregate together inside.  He has not been getting any exercise.  He has gained 25 pounds in the last 18 months.  He is feeling a little more anxious and depressed.  Review of Systems: Review of Systems  Constitutional: Negative for chills, fever and weight loss.  Respiratory: Negative for cough.   Cardiovascular: Negative for chest pain.  Gastrointestinal: Negative for abdominal pain, diarrhea, nausea and vomiting.  Psychiatric/Behavioral: Positive for depression. The patient is nervous/anxious.     Past Medical History:  Diagnosis Date  . CAD (coronary artery disease)    Overlapping DES x2 to the mid to distal circumflex extending into OM 3 01/14/2018, DES x2 to the mid to distal RCA 01/16/2018  . Essential hypertension   . Grover's disease 2017  . HIV (human immunodeficiency virus infection) ([place])   . Polycythemia 2017  . Seasonal  allergies 2017  . ST elevation myocardial infarction (STEMI) of inferior wall [facility])    November 2019  . Stroke (cerebrum) [facility])     Social History   Tobacco Use  . Smoking status: Current Every Day Smoker    Packs/day: 1.00    Types: Cigarettes    Start date: 04/30/1964  . Smokeless tobacco: Never Used  Substance Use Topics  . Alcohol use: Not Currently  . Drug use: Not Currently    Types: Marijuana    Family History  Problem Relation Age of Onset  . Heart disease Mother     Allergies  Allergen Reactions  . Penicillins Anaphylaxis    High fever  Did it involve swelling of the face/tongue/throat, SOB, or low BP? No Did it involve  sudden or severe rash/hives, skin peeling, or any reaction on the inside of your mouth or nose? No Did you need to seek medical attention at a hospital or doctor's office? Yes When did it last happen?45 + years If all above answers are "NO", may proceed with cephalosporin use.      Health Maintenance  Topic Date Due  . COVID-19 Vaccine (1) Never done  . TETANUS/TDAP  Never done  . COLONOSCOPY  Never done  . INFLUENZA VACCINE  09/21/2019  . PNA vac Low Risk Adult (2 of 2 - PCV13) 12/03/2019  . Hepatitis C Screening  Completed    Objective:  Vitals:   10/01/19 0943  BP: (!) 180/69  Pulse: 84  Temp: 97.6 F (36.4 C)  TempSrc: Oral  Weight: 201 lb (91.2 kg)   Body mass index is 28.43 kg/m.  Physical Exam Constitutional:      Comments: He is in good spirits.  His weight is up 25 pounds since December 2019.  It is stored mostly has abdominal fat.  Cardiovascular:     Rate and Rhythm: Normal rate and regular rhythm.     Heart sounds: No murmur heard.   Pulmonary:     Effort: Pulmonary effort is normal.     Breath sounds: Normal breath sounds.  Abdominal:     Palpations: Abdomen is soft.     Tenderness: There is no abdominal tenderness.  Psychiatric:        Mood and Affect: Mood normal.     Lab Results Lab Results  Component Value Date   WBC 8.6 06/12/2019   HGB 18.2 (H) 06/12/2019   HCT 52.0 (H) 06/12/2019   MCV 102.6 (H) 06/12/2019   PLT 177 06/12/2019    Lab Results  Component Value Date   CREATININE 1.34 (H) 06/12/2019   BUN 18 06/12/2019   NA 143 06/12/2019   K 3.8 06/12/2019   CL 108 06/12/2019   CO2 24 06/12/2019    Lab Results  Component Value Date   ALT 42 06/12/2019   AST 31 06/12/2019   ALKPHOS 122 (H) 04/23/2018   BILITOT 0.6 06/12/2019    Lab Results  Component Value Date   CHOL 137 06/12/2019   HDL 54 06/12/2019   LDLCALC 65 06/12/2019   TRIG 94 06/12/2019   CHOLHDL 2.5 06/12/2019   Lab Results  Component Value Date    LABRPR NON-REACTIVE 06/12/2019   HIV 1 RNA Quant  Date Value  06/12/2019 <20 NOT DETECTED copies/mL  11/26/2017 <20 NOT DETECTED copies/mL  05/23/2017 <20 Copies/mL (H)   CD4 T Cell Abs (/uL)  Date Value  06/12/2019 725  11/26/2017 980  05/23/2017 980     Problem List  Items Addressed This Visit      High   HIV disease ([place])    His infection remains under excellent, long-term control.  He will continue Biktarvy and follow-up after lab work in 1 year.      Relevant Orders   CBC   T-helper cell (CD4)- ([facility] clinic only)   Comprehensive metabolic panel   Lipid panel   RPR   HIV-1 RNA quant-no reflex-bld     Unprioritized   Depression    He suffers from chronic depression that has gotten a little worse during the Covid pandemic.  Reminded him that he can meet with our behavioral health counselor at any time.           Michel Bickers, MD [facility] [place] [facility] [phone] pager   [phone] cell 10/01/2019, 10:08 AM

## 2019-10-01 NOTE — Assessment & Plan Note (Signed)
His infection remains under excellent, long-term control.  He will continue Biktarvy and follow-up after lab work in 1 year. 

## 2019-10-01 NOTE — Assessment & Plan Note (Signed)
He suffers from chronic depression that has gotten a little worse during the Covid pandemic.  Reminded him that he can meet with our behavioral health counselor at any time.

## 2019-10-16 ENCOUNTER — Telehealth: Payer: Self-pay | Admitting: Family Medicine

## 2019-10-16 NOTE — Telephone Encounter (Signed)
     I went in pt's chart. He said he received a call from this office today.

## 2019-11-06 ENCOUNTER — Other Ambulatory Visit: Payer: Self-pay | Admitting: Cardiology

## 2019-11-18 ENCOUNTER — Telehealth: Payer: Self-pay | Admitting: Cardiology

## 2019-11-18 NOTE — Telephone Encounter (Signed)
Left message to return call 

## 2019-11-18 NOTE — Telephone Encounter (Signed)
Patient questioning his dose of Lipitor 20mg  - stated that he just got filled a week ago with 20mg , but just got email from pharmacy stating that he had Lipitor 40mg  there ready for pickup.  Confirmed with patient that we did not make a dose increase & we last sent in refill for the 20mg  on 05/13/19 for #90 + 3 refills.  He verbalized understanding & will call his pharmacy back.

## 2019-11-18 NOTE — Telephone Encounter (Signed)
Please give pt a call concerning his atorvastatin (LIPITOR) 20 MG tablet   [phone]

## 2019-12-23 ENCOUNTER — Other Ambulatory Visit: Payer: Self-pay | Admitting: Internal Medicine

## 2019-12-23 DIAGNOSIS — B20 Human immunodeficiency virus [HIV] disease: Secondary | ICD-10-CM

## 2019-12-26 DIAGNOSIS — I10 Essential (primary) hypertension: Secondary | ICD-10-CM | POA: Diagnosis not present

## 2019-12-26 DIAGNOSIS — Z6829 Body mass index (BMI) 29.0-29.9, adult: Secondary | ICD-10-CM | POA: Diagnosis not present

## 2019-12-26 DIAGNOSIS — B20 Human immunodeficiency virus [HIV] disease: Secondary | ICD-10-CM | POA: Diagnosis not present

## 2019-12-26 DIAGNOSIS — I2510 Atherosclerotic heart disease of native coronary artery without angina pectoris: Secondary | ICD-10-CM | POA: Diagnosis not present

## 2019-12-26 DIAGNOSIS — Z23 Encounter for immunization: Secondary | ICD-10-CM | POA: Diagnosis not present

## 2019-12-26 DIAGNOSIS — J449 Chronic obstructive pulmonary disease, unspecified: Secondary | ICD-10-CM | POA: Diagnosis not present

## 2019-12-26 DIAGNOSIS — F17200 Nicotine dependence, unspecified, uncomplicated: Secondary | ICD-10-CM | POA: Diagnosis not present

## 2020-01-02 DIAGNOSIS — Z6830 Body mass index (BMI) 30.0-30.9, adult: Secondary | ICD-10-CM | POA: Diagnosis not present

## 2020-01-02 DIAGNOSIS — I2510 Atherosclerotic heart disease of native coronary artery without angina pectoris: Secondary | ICD-10-CM | POA: Diagnosis not present

## 2020-01-02 DIAGNOSIS — B20 Human immunodeficiency virus [HIV] disease: Secondary | ICD-10-CM | POA: Diagnosis not present

## 2020-01-02 DIAGNOSIS — E559 Vitamin D deficiency, unspecified: Secondary | ICD-10-CM | POA: Diagnosis not present

## 2020-01-02 DIAGNOSIS — R972 Elevated prostate specific antigen [PSA]: Secondary | ICD-10-CM | POA: Diagnosis not present

## 2020-01-02 DIAGNOSIS — I10 Essential (primary) hypertension: Secondary | ICD-10-CM | POA: Diagnosis not present

## 2020-01-02 DIAGNOSIS — F17200 Nicotine dependence, unspecified, uncomplicated: Secondary | ICD-10-CM | POA: Diagnosis not present

## 2020-01-02 DIAGNOSIS — J449 Chronic obstructive pulmonary disease, unspecified: Secondary | ICD-10-CM | POA: Diagnosis not present

## 2020-01-08 DIAGNOSIS — N183 Chronic kidney disease, stage 3 unspecified: Secondary | ICD-10-CM | POA: Diagnosis not present

## 2020-01-08 DIAGNOSIS — I10 Essential (primary) hypertension: Secondary | ICD-10-CM | POA: Diagnosis not present

## 2020-01-08 DIAGNOSIS — Z720 Tobacco use: Secondary | ICD-10-CM | POA: Diagnosis not present

## 2020-01-08 DIAGNOSIS — D751 Secondary polycythemia: Secondary | ICD-10-CM | POA: Diagnosis not present

## 2020-01-08 DIAGNOSIS — Z1329 Encounter for screening for other suspected endocrine disorder: Secondary | ICD-10-CM | POA: Diagnosis not present

## 2020-01-08 DIAGNOSIS — Z79899 Other long term (current) drug therapy: Secondary | ICD-10-CM | POA: Diagnosis not present

## 2020-01-08 DIAGNOSIS — Z87891 Personal history of nicotine dependence: Secondary | ICD-10-CM | POA: Diagnosis not present

## 2020-01-08 DIAGNOSIS — I129 Hypertensive chronic kidney disease with stage 1 through stage 4 chronic kidney disease, or unspecified chronic kidney disease: Secondary | ICD-10-CM | POA: Diagnosis not present

## 2020-01-08 DIAGNOSIS — J449 Chronic obstructive pulmonary disease, unspecified: Secondary | ICD-10-CM | POA: Diagnosis not present

## 2020-01-08 DIAGNOSIS — N189 Chronic kidney disease, unspecified: Secondary | ICD-10-CM | POA: Diagnosis not present

## 2020-01-09 DIAGNOSIS — R972 Elevated prostate specific antigen [PSA]: Secondary | ICD-10-CM | POA: Diagnosis not present

## 2020-01-12 DIAGNOSIS — Z1329 Encounter for screening for other suspected endocrine disorder: Secondary | ICD-10-CM | POA: Diagnosis not present

## 2020-01-12 DIAGNOSIS — Z87891 Personal history of nicotine dependence: Secondary | ICD-10-CM | POA: Diagnosis not present

## 2020-01-12 DIAGNOSIS — N189 Chronic kidney disease, unspecified: Secondary | ICD-10-CM | POA: Diagnosis not present

## 2020-01-12 DIAGNOSIS — D751 Secondary polycythemia: Secondary | ICD-10-CM | POA: Diagnosis not present

## 2020-01-12 DIAGNOSIS — J449 Chronic obstructive pulmonary disease, unspecified: Secondary | ICD-10-CM | POA: Diagnosis not present

## 2020-01-12 DIAGNOSIS — Z720 Tobacco use: Secondary | ICD-10-CM | POA: Diagnosis not present

## 2020-01-12 DIAGNOSIS — I10 Essential (primary) hypertension: Secondary | ICD-10-CM | POA: Diagnosis not present

## 2020-01-13 DIAGNOSIS — D751 Secondary polycythemia: Secondary | ICD-10-CM | POA: Diagnosis not present

## 2020-01-13 DIAGNOSIS — Z79899 Other long term (current) drug therapy: Secondary | ICD-10-CM | POA: Diagnosis not present

## 2020-01-22 ENCOUNTER — Telehealth: Payer: Self-pay | Admitting: *Deleted

## 2020-01-22 DIAGNOSIS — Z87891 Personal history of nicotine dependence: Secondary | ICD-10-CM | POA: Diagnosis not present

## 2020-01-22 DIAGNOSIS — N189 Chronic kidney disease, unspecified: Secondary | ICD-10-CM | POA: Diagnosis not present

## 2020-01-22 DIAGNOSIS — Z720 Tobacco use: Secondary | ICD-10-CM | POA: Diagnosis not present

## 2020-01-22 DIAGNOSIS — D751 Secondary polycythemia: Secondary | ICD-10-CM | POA: Diagnosis not present

## 2020-01-22 DIAGNOSIS — Z1329 Encounter for screening for other suspected endocrine disorder: Secondary | ICD-10-CM | POA: Diagnosis not present

## 2020-01-22 DIAGNOSIS — I129 Hypertensive chronic kidney disease with stage 1 through stage 4 chronic kidney disease, or unspecified chronic kidney disease: Secondary | ICD-10-CM | POA: Diagnosis not present

## 2020-01-22 NOTE — Telephone Encounter (Signed)
   [facility] Pre-operative Risk Assessment    HEARTCARE STAFF: - Please ensure there is not already an duplicate clearance open for this procedure. - Under Visit Info/Reason for Call, type in Other and utilize the format Clearance MM/DD/YY or Clearance TBD. Do not use dashes or single digits. - If request is for dental extraction, please clarify the # of teeth to be extracted.  Request for surgical clearance:  1. What type of surgery is being performed? PROSTATE BIOPSY   2. When is this surgery scheduled? TBD   What type of clearance is required (medical clearance vs. Pharmacy clearance to hold med vs. Both)? BOTH  3. Are there any medications that need to be held prior to surgery and how long?   ASA AND PLAVIX FOR 5 DAYS PRIOR  4. Practice name and name of physician performing surgery? [facility]   5. What is the office phone number? [phone]   7.   What is the office fax number? [phone]  8.   Anesthesia type (None, local, MAC, general) ? NONE MENTIONED    Julian Hy T 01/22/2020, 4:38 PM  _________________________________________________________________   (provider comments below)

## 2020-01-23 NOTE — Telephone Encounter (Signed)
Pt has appt scheduled 01/29/2020 @ 1:30 PM at our [facility] office w/Andrew Leonides Sake, NP

## 2020-01-23 NOTE — Telephone Encounter (Signed)
Primary Cardiologist:Samuel Domenic Polite, MD  Chart reviewed as part of pre-operative protocol coverage. Because of Nicholas [name]'s past medical history and time since last visit, he/she will require a follow-up visit in order to better assess preoperative cardiovascular risk.  Pre-op covering staff: - Please schedule appointment and call patient to inform them. - Please contact requesting surgeon's office via preferred method (i.e, phone, fax) to inform them of need for appointment prior to surgery.  If applicable, this message will also be routed to pharmacy pool and/or primary cardiologist for input on holding anticoagulant/antiplatelet agent as requested below so that this information is available at time of patient's appointment.   Deberah Pelton, NP  01/23/2020, 10:48 AM

## 2020-01-23 NOTE — Telephone Encounter (Signed)
Forwarded to requesting providers via [employer]

## 2020-01-26 ENCOUNTER — Ambulatory Visit: Payer: Medicare HMO | Admitting: "Endocrinology

## 2020-01-29 ENCOUNTER — Ambulatory Visit: Payer: Medicare HMO | Admitting: Family Medicine

## 2020-01-29 DIAGNOSIS — Z9981 Dependence on supplemental oxygen: Secondary | ICD-10-CM | POA: Diagnosis not present

## 2020-01-29 DIAGNOSIS — D751 Secondary polycythemia: Secondary | ICD-10-CM | POA: Diagnosis not present

## 2020-01-29 DIAGNOSIS — J431 Panlobular emphysema: Secondary | ICD-10-CM | POA: Diagnosis not present

## 2020-01-29 DIAGNOSIS — R0902 Hypoxemia: Secondary | ICD-10-CM | POA: Diagnosis not present

## 2020-01-29 DIAGNOSIS — Z7189 Other specified counseling: Secondary | ICD-10-CM | POA: Diagnosis not present

## 2020-01-29 DIAGNOSIS — Z7289 Other problems related to lifestyle: Secondary | ICD-10-CM | POA: Diagnosis not present

## 2020-01-29 NOTE — Progress Notes (Deleted)
Cardiology Office Note  Date: 02/02/2020   ID: Nicholas [name], DOB [birth year], MRN [id number]  PCP:  Neale Burly, MD  Cardiologist:  Rozann Lesches, MD Electrophysiologist:  None   Chief Complaint:   Pre-op clearance   History of Present Illness: Nicholas [name] is a 69 y.o. male with a history of SOB, CAD, PAD, HLD, GI symptoms.  Last seen by Dr. Domenic Polite via telemedicine 05/08/2019.  Patient did not report any anginal symptoms but stated he remained short of breath dating back to 2019 with ACS.  Also complained of intermittent ankle swelling.  Complained of abdominal protuberance and swelling as well as difficulty controlling his bowels.  He was referred to GI by PCP.  He had been seen by Dr. Gwenlyn Found in September last year due to claudication and abnormal ABI  predominantly left-sided.  He underwent angiography in October 2020 demonstrating short segment CTO of the mid left SFA with three-vessel runoff.  This was treated with directional atherectomy and drug coated balloon angioplasty.  He reported improvement in claudication-like symptoms.  In the interim since last visit patient was sent to the emergency room on the day he had some lab work drawn on 06/26/2019.  His blood pressure was elevated in the 190s.  He had been complaining of a bifrontal headache for the previous 2 days.  He brings with him a log of blood pressures which show a systolic heart is running in the 160s 170s.  He denies any anginal or exertional symptoms, palpitations or arrhythmias, orthostatic symptoms.  Currently denies any headache.  States he is having some significant allergy symptoms affecting his eyes.    Past Medical History:  Diagnosis Date  . CAD (coronary artery disease)    Overlapping DES x2 to the mid to distal circumflex extending into OM 3 01/14/2018, DES x2 to the mid to distal RCA 01/16/2018  . Essential hypertension   . Grover's disease 2017  . HIV (human immunodeficiency virus infection) ([place])    . Polycythemia 2017  . Seasonal allergies 2017  . ST elevation myocardial infarction (STEMI) of inferior wall [facility])    November 2019  . Stroke (cerebrum) [facility])     Past Surgical History:  Procedure Laterality Date  . ABDOMINAL AORTOGRAM W/LOWER EXTREMITY Bilateral 12/02/2018   Procedure: ABDOMINAL AORTOGRAM W/LOWER EXTREMITY;  Surgeon: Lorretta Harp, MD;  Location: [place] [facility];  Service: Cardiovascular;  Laterality: Bilateral;  . CORONARY STENT INTERVENTION N/A 01/16/2018   Procedure: CORONARY STENT INTERVENTION;  Surgeon: Troy Sine, MD;  Location: [place] [facility];  Service: Cardiovascular;  Laterality: N/A;  . CORONARY/GRAFT ACUTE MI REVASCULARIZATION N/A 01/14/2018   Procedure: Coronary/Graft Acute MI Revascularization;  Surgeon: Nelva Bush, MD;  Location: [place] [facility];  Service: Cardiovascular;  Laterality: N/A;  . LEFT HEART CATH N/A 01/16/2018   Procedure: Left Heart Cath;  Surgeon: Troy Sine, MD;  Location: [place] [facility];  Service: Cardiovascular;  Laterality: N/A;  . LEFT HEART CATH AND CORONARY ANGIOGRAPHY N/A 01/14/2018   Procedure: LEFT HEART CATH AND CORONARY ANGIOGRAPHY;  Surgeon: Nelva Bush, MD;  Location: [place] [facility];  Service: Cardiovascular;  Laterality: N/A;  . PERIPHERAL VASCULAR ATHERECTOMY  12/02/2018   Procedure: PERIPHERAL VASCULAR ATHERECTOMY;  Surgeon: Lorretta Harp, MD;  Location: [place] [facility];  Service: Cardiovascular;;  Left SFA  . PERIPHERAL VASCULAR BALLOON ANGIOPLASTY  12/02/2018   Procedure: PERIPHERAL VASCULAR BALLOON ANGIOPLASTY;  Surgeon: Lorretta Harp, MD;  Location:  [place] [facility];  Service: Cardiovascular;;  Left SFA  . ULTRASOUND GUIDANCE FOR VASCULAR ACCESS  01/16/2018   Procedure: Ultrasound Guidance For Vascular Access;  Surgeon: Troy Sine, MD;  Location: [place] [facility];  Service: Cardiovascular;;    Current Outpatient Medications  Medication Sig Dispense Refill   . amLODipine (NORVASC) 5 MG tablet TAKE 1 TABLET BY MOUTH EVERY DAY 30 tablet 6  . ANORO ELLIPTA 62.5-25 MCG/INH AEPB Take 1 puff by mouth every morning.  1  . aspirin EC 81 MG tablet Take 1 tablet (81 mg total) by mouth daily.    Marland Kitchen atorvastatin (LIPITOR) 20 MG tablet Take 1 tablet (20 mg total) by mouth daily. 90 tablet 3  . BIKTARVY 50-200-25 MG TABS tablet TAKE 1 TABLET BY MOUTH DAILY. 30 tablet 5  . carvedilol (COREG) 12.5 MG tablet Take 1 tablet (12.5 mg total) by mouth 2 (two) times daily. 60 tablet 6  . clopidogrel (PLAVIX) 75 MG tablet TAKE 1 TABLET(75 MG) BY MOUTH DAILY 90 tablet 1  . GRAPE SEED EXTRACT PO Take 2 tablets by mouth every morning. (Patient not taking: Reported on 10/01/2019)    . hydrochlorothiazide (HYDRODIURIL) 25 MG tablet Take 1 tablet (25 mg total) by mouth daily. 30 tablet 1  . oxyCODONE-acetaminophen (PERCOCET) 5-325 MG tablet Take 1 tablet by mouth as needed for severe pain (back pain).    . VENTOLIN HFA 108 (90 Base) MCG/ACT inhaler Take 1-2 puffs by mouth every 6 (six) hours as needed for shortness of breath or wheezing.  5   No current facility-administered medications for this visit.   Allergies:  Penicillins   Social History: The patient  reports that he has been smoking cigarettes. He started smoking about 55 years ago. He has been smoking about 1.00 pack per day. He has never used smokeless tobacco. He reports previous alcohol use. He reports previous drug use. Drug: Marijuana.   Family History: The patient's family history includes Heart disease in his mother.   ROS:  Please see the history of present illness. Otherwise, complete review of systems is positive for none.  All other systems are reviewed and negative.   Physical Exam: VS:  There were no vitals taken for this visit., BMI There is no height or weight on file to calculate BMI.  Wt Readings from Last 3 Encounters:  10/01/19 201 lb (91.2 kg)  06/30/19 197 lb 6.4 oz (89.5 kg)  06/16/19 199 lb  (90.3 kg)    General: Patient appears comfortable at rest. Neck: Supple, no elevated JVP or carotid bruits, no thyromegaly. Lungs: Clear to auscultation, nonlabored breathing at rest. Cardiac: Regular rate and rhythm, no S3 or significant systolic murmur, no pericardial rub. Extremities: No pitting edema, distal pulses 2+. Skin: Warm and dry. Musculoskeletal: No kyphosis. Neuropsychiatric: Alert and oriented x3, affect grossly appropriate.  ECG:  An ECG dated 06/16/2019 was personally reviewed today and demonstrated:  Sinus rhythm rate of 86.  No acute ST or T wave abnormalities noted,  Recent Labwork: 06/12/2019: ALT 42; AST 31; BUN 18; Creat 1.34; Hemoglobin 18.2; Platelets 177; Potassium 3.8; Sodium 143     Component Value Date/Time   CHOL 137 06/12/2019 1516   TRIG 94 06/12/2019 1516   HDL 54 06/12/2019 1516   CHOLHDL 2.5 06/12/2019 1516   VLDL 16 01/14/2018 0745   LDLCALC 65 06/12/2019 1516    Other Studies Reviewed Today:  Peripheral vascular catheterization 12/02/2018  Angiographic Data:  1: Abdominal aortic-the renal  arteries widely patent.  The infrarenal abdominal aorta had mild atherosclerotic changes. 2: Left lower extremity-short segment CTO mid left SFA with three-vessel runoff 3: Right lower extremity-40 to 50% proximal right external echo restenosis  IMPRESSION: Mr. Laday has a short segment CTO mid left SFA responsible for his claudication.  We will proceed with directional atherectomy followed by drug-coated balloon angioplasty using distal protection  Final Impression: Successful Hawk 1 directional arthrectomy followed by drug-coated balloon angioplasty of a short segment mid left SFA CTO using spider distal protection.  The patient was already on dual antiplatelet therapy including aspirin and Brilinta.  He will be hydrated overnight, discharged home in the morning.  We will get lower extremity arterial Doppler studies in our [facility] line office next week and I  will see him back 2 to 3 weeks thereafter.  Cardiac Catheterization 01/16/2018    Dist RCA lesion is 85% stenosed.  Prox RCA lesion is 20% stenosed.  Mid RCA lesion is 95% stenosed.  Post intervention, there is a 0% residual stenosis.  Post intervention, there is a 0% residual stenosis.  A stent was successfully placed.  A stent was successfully placed.   Very difficult but ultimately successful PCI to the 95% mid RCA stenosis and the distal 85% RCA stenosis which appeared more significant since the initial evaluation.  A 2.25 x 15 mm Xience Sierra DES stent was inserted distally with post stent dilatation up to 2.6 mm.  A 2.5 x 15 mm Xience Sierra stent was inserted after much difficulty into the mid site with ultimate post stent dilatation up to 2.78 mm.  Post stent stenoses were reduced to 0%.  LVEDP 11 mmHg.  RECOMMENDATION: Continued medical therapy for concomitant CAD.  High potency statin therapy with target LDL less than 70.  Optimal blood pressure control with target blood pressure less than 130/80.  Post MI beta-blocker, ACE/ARB inhibition. Recommend uninterrupted dual antiplatelet therapy with Aspirin 81mg  daily and Ticagrelor 90mg  twice daily for a minimum of 12 months (ACS - Class I recommendation).  Diagnostic Dominance: Right  Intervention      Echocardiogram 05/13/2019 1. Left ventricular ejection fraction, by estimation, is 60 to 65%. The left ventricle has normal function. The left ventricle has no regional wall motion abnormalities. There is mild concentric left ventricular hypertrophy. Left ventricular diastolic parameters are consistent with Grade I diastolic dysfunction (impaired relaxation). 2. Right ventricular systolic function is normal. The right ventricular size is normal. There is normal pulmonary artery systolic pressure. 3. The mitral valve is grossly normal. Trivial mitral valve regurgitation. 4. The aortic valve is tricuspid. Aortic valve  regurgitation is not visualized. No aortic stenosis is present. Comparison(s): Echocardiogram done 01/15/18 showed an EF of 60%.  Echocardiogram 01/15/2018: Study Conclusions - Left ventricle: The cavity size was normal. There was mild concentric hypertrophy. Systolic function was normal. The estimated ejection fraction was in the range of 55% to 60%. Images were inadequate for LV wall motion assessment. Features are consistent with a pseudonormal left ventricular filling pattern, with concomitant abnormal relaxation and increased filling pressure (grade 2 diastolic dysfunction). - Left atrium: The atrium was mildly dilated. - Right atrium: The atrium was mildly dilated. - Pulmonary arteries: Systolic pressure could not be accurately estimated. - Inferior vena cava: The vessel was mildly dilated.  Assessment and Plan:   1. Pre-op clearance   2. CAD in native artery Status post DES x2 to mid and distal RCA 2019.  Denies any progressive anginal or exertional symptoms.  Continue aspirin 81 mg daily, Plavix 75 mg daily.  3. PAD (peripheral artery disease) ([place])  Successful Hawk 1 directional arthrectomy followed by drug-coated balloon angioplasty of a short segment mid left SFA CTO using spider distal protection.  Patient denies any recent claudication-like symptoms.   4. Mixed hyperlipidemia Recent lipid panel showed; TC 137, HDL 54, TG 94, LDL 65.  Continue atorvastatin 20 mg daily.  5.  Hypertension Blood pressure today 158/84.  Patient states his blood pressure has been elevated over the last couple of months.  Increase Coreg to 12.5 mg p.o. twice daily.Marland Kitchen twice daily, add HCTZ 25 mg p.o. daily.  We will follow-up in a month.  Advised patient to continue checking his blood pressure and bring a log of his blood pressures with him at next visit.  6.  Smoking Patient admits to continued smoking.  Encouraged cessation   Medication Adjustments/Labs and Tests  Ordered: Current medicines are reviewed at length with the patient today.  Concerns regarding medicines are outlined above.   Disposition: Follow-up with Dr Domenic Polite or APP 1 month Signed, Levell July, NP 02/02/2020 7:51 AM    [facility] [place], [place], [place] [address] Phone: [phone]; Fax: [phone]

## 2020-01-30 ENCOUNTER — Ambulatory Visit: Payer: Medicare HMO | Admitting: Family Medicine

## 2020-01-30 DIAGNOSIS — I739 Peripheral vascular disease, unspecified: Secondary | ICD-10-CM

## 2020-01-30 DIAGNOSIS — Z720 Tobacco use: Secondary | ICD-10-CM

## 2020-01-30 DIAGNOSIS — E785 Hyperlipidemia, unspecified: Secondary | ICD-10-CM

## 2020-01-30 DIAGNOSIS — Z01818 Encounter for other preprocedural examination: Secondary | ICD-10-CM

## 2020-01-30 DIAGNOSIS — I2510 Atherosclerotic heart disease of native coronary artery without angina pectoris: Secondary | ICD-10-CM

## 2020-02-03 DIAGNOSIS — J439 Emphysema, unspecified: Secondary | ICD-10-CM | POA: Diagnosis not present

## 2020-02-03 DIAGNOSIS — F17210 Nicotine dependence, cigarettes, uncomplicated: Secondary | ICD-10-CM | POA: Diagnosis not present

## 2020-02-03 DIAGNOSIS — Z122 Encounter for screening for malignant neoplasm of respiratory organs: Secondary | ICD-10-CM | POA: Diagnosis not present

## 2020-02-03 DIAGNOSIS — Z87891 Personal history of nicotine dependence: Secondary | ICD-10-CM | POA: Diagnosis not present

## 2020-02-03 DIAGNOSIS — K760 Fatty (change of) liver, not elsewhere classified: Secondary | ICD-10-CM | POA: Diagnosis not present

## 2020-02-03 DIAGNOSIS — I700 Atherosclerosis of aorta: Secondary | ICD-10-CM | POA: Diagnosis not present

## 2020-02-05 DIAGNOSIS — Z87891 Personal history of nicotine dependence: Secondary | ICD-10-CM | POA: Diagnosis not present

## 2020-02-05 DIAGNOSIS — N189 Chronic kidney disease, unspecified: Secondary | ICD-10-CM | POA: Diagnosis not present

## 2020-02-05 DIAGNOSIS — D751 Secondary polycythemia: Secondary | ICD-10-CM | POA: Diagnosis not present

## 2020-02-05 DIAGNOSIS — Z1329 Encounter for screening for other suspected endocrine disorder: Secondary | ICD-10-CM | POA: Diagnosis not present

## 2020-02-05 DIAGNOSIS — I129 Hypertensive chronic kidney disease with stage 1 through stage 4 chronic kidney disease, or unspecified chronic kidney disease: Secondary | ICD-10-CM | POA: Diagnosis not present

## 2020-02-10 ENCOUNTER — Other Ambulatory Visit: Payer: Self-pay | Admitting: Cardiology

## 2020-02-19 DIAGNOSIS — Z7289 Other problems related to lifestyle: Secondary | ICD-10-CM | POA: Diagnosis not present

## 2020-02-19 DIAGNOSIS — R7989 Other specified abnormal findings of blood chemistry: Secondary | ICD-10-CM | POA: Diagnosis not present

## 2020-02-19 DIAGNOSIS — J431 Panlobular emphysema: Secondary | ICD-10-CM | POA: Diagnosis not present

## 2020-02-19 DIAGNOSIS — R0902 Hypoxemia: Secondary | ICD-10-CM | POA: Diagnosis not present

## 2020-02-19 DIAGNOSIS — Z9981 Dependence on supplemental oxygen: Secondary | ICD-10-CM | POA: Diagnosis not present

## 2020-02-19 DIAGNOSIS — B20 Human immunodeficiency virus [HIV] disease: Secondary | ICD-10-CM | POA: Diagnosis not present

## 2020-02-19 DIAGNOSIS — Z720 Tobacco use: Secondary | ICD-10-CM | POA: Diagnosis not present

## 2020-02-19 DIAGNOSIS — D751 Secondary polycythemia: Secondary | ICD-10-CM | POA: Diagnosis not present

## 2020-02-19 DIAGNOSIS — R911 Solitary pulmonary nodule: Secondary | ICD-10-CM | POA: Diagnosis not present

## 2020-02-25 ENCOUNTER — Other Ambulatory Visit: Payer: Self-pay | Admitting: Oncology

## 2020-02-25 ENCOUNTER — Other Ambulatory Visit (HOSPITAL_COMMUNITY): Payer: Self-pay | Admitting: Oncology

## 2020-02-25 DIAGNOSIS — R911 Solitary pulmonary nodule: Secondary | ICD-10-CM

## 2020-03-04 DIAGNOSIS — R7989 Other specified abnormal findings of blood chemistry: Secondary | ICD-10-CM | POA: Diagnosis not present

## 2020-03-04 DIAGNOSIS — D751 Secondary polycythemia: Secondary | ICD-10-CM | POA: Diagnosis not present

## 2020-03-04 DIAGNOSIS — R911 Solitary pulmonary nodule: Secondary | ICD-10-CM | POA: Diagnosis not present

## 2020-03-05 ENCOUNTER — Encounter (HOSPITAL_COMMUNITY): Payer: Self-pay

## 2020-03-05 ENCOUNTER — Ambulatory Visit (HOSPITAL_COMMUNITY): Payer: Medicare HMO

## 2020-03-24 DIAGNOSIS — Z9981 Dependence on supplemental oxygen: Secondary | ICD-10-CM | POA: Diagnosis not present

## 2020-03-24 DIAGNOSIS — R0902 Hypoxemia: Secondary | ICD-10-CM | POA: Diagnosis not present

## 2020-03-24 DIAGNOSIS — Z9119 Patient's noncompliance with other medical treatment and regimen: Secondary | ICD-10-CM | POA: Diagnosis not present

## 2020-03-24 DIAGNOSIS — R911 Solitary pulmonary nodule: Secondary | ICD-10-CM | POA: Diagnosis not present

## 2020-03-24 DIAGNOSIS — D751 Secondary polycythemia: Secondary | ICD-10-CM | POA: Diagnosis not present

## 2020-03-24 DIAGNOSIS — J431 Panlobular emphysema: Secondary | ICD-10-CM | POA: Diagnosis not present

## 2020-03-26 DIAGNOSIS — Z720 Tobacco use: Secondary | ICD-10-CM | POA: Diagnosis not present

## 2020-03-26 DIAGNOSIS — I129 Hypertensive chronic kidney disease with stage 1 through stage 4 chronic kidney disease, or unspecified chronic kidney disease: Secondary | ICD-10-CM | POA: Diagnosis not present

## 2020-03-26 DIAGNOSIS — Z87891 Personal history of nicotine dependence: Secondary | ICD-10-CM | POA: Diagnosis not present

## 2020-03-26 DIAGNOSIS — Z1329 Encounter for screening for other suspected endocrine disorder: Secondary | ICD-10-CM | POA: Diagnosis not present

## 2020-03-26 DIAGNOSIS — N189 Chronic kidney disease, unspecified: Secondary | ICD-10-CM | POA: Diagnosis not present

## 2020-03-26 DIAGNOSIS — D751 Secondary polycythemia: Secondary | ICD-10-CM | POA: Diagnosis not present

## 2020-04-08 ENCOUNTER — Other Ambulatory Visit: Payer: Self-pay

## 2020-04-08 ENCOUNTER — Ambulatory Visit (HOSPITAL_COMMUNITY)
Admission: RE | Admit: 2020-04-08 | Discharge: 2020-04-08 | Disposition: A | Discharge status: Home / Self Care | Payer: Medicare HMO | Source: Ambulatory Visit | Attending: Oncology | Admitting: Oncology

## 2020-04-08 DIAGNOSIS — I999 Unspecified disorder of circulatory system: Secondary | ICD-10-CM | POA: Diagnosis not present

## 2020-04-08 DIAGNOSIS — R911 Solitary pulmonary nodule: Secondary | ICD-10-CM | POA: Diagnosis not present

## 2020-04-08 DIAGNOSIS — J439 Emphysema, unspecified: Secondary | ICD-10-CM | POA: Insufficient documentation

## 2020-04-08 DIAGNOSIS — R7309 Other abnormal glucose: Secondary | ICD-10-CM | POA: Diagnosis not present

## 2020-04-08 LAB — GLUCOSE, CAPILLARY: Glucose-Capillary: 114 mg/dL — ABNORMAL HIGH (ref 70–99)

## 2020-04-08 MED ORDER — FLUDEOXYGLUCOSE F - 18 (FDG) INJECTION
10.0000 | Freq: Once | INTRAVENOUS | Status: AC
  Administered 2020-04-08: 10.03 via INTRAVENOUS

## 2020-04-16 ENCOUNTER — Other Ambulatory Visit: Payer: Self-pay

## 2020-04-16 DIAGNOSIS — I10 Essential (primary) hypertension: Secondary | ICD-10-CM

## 2020-04-22 ENCOUNTER — Institutional Professional Consult (permissible substitution): Payer: Medicare HMO | Admitting: Emergency Medicine

## 2020-04-23 ENCOUNTER — Institutional Professional Consult (permissible substitution): Payer: Medicare HMO | Admitting: Emergency Medicine

## 2020-04-23 ENCOUNTER — Other Ambulatory Visit: Payer: Self-pay | Admitting: Family Medicine

## 2020-05-04 DIAGNOSIS — D751 Secondary polycythemia: Secondary | ICD-10-CM | POA: Diagnosis not present

## 2020-05-04 DIAGNOSIS — B20 Human immunodeficiency virus [HIV] disease: Secondary | ICD-10-CM | POA: Diagnosis not present

## 2020-05-04 DIAGNOSIS — Z720 Tobacco use: Secondary | ICD-10-CM | POA: Diagnosis not present

## 2020-05-04 DIAGNOSIS — Z9119 Patient's noncompliance with other medical treatment and regimen: Secondary | ICD-10-CM | POA: Diagnosis not present

## 2020-05-04 DIAGNOSIS — R0902 Hypoxemia: Secondary | ICD-10-CM | POA: Diagnosis not present

## 2020-05-04 DIAGNOSIS — R911 Solitary pulmonary nodule: Secondary | ICD-10-CM | POA: Diagnosis not present

## 2020-05-04 DIAGNOSIS — Z9981 Dependence on supplemental oxygen: Secondary | ICD-10-CM | POA: Diagnosis not present

## 2020-05-04 DIAGNOSIS — J431 Panlobular emphysema: Secondary | ICD-10-CM | POA: Diagnosis not present

## 2020-05-04 DIAGNOSIS — Z7189 Other specified counseling: Secondary | ICD-10-CM | POA: Diagnosis not present

## 2020-05-06 DIAGNOSIS — E559 Vitamin D deficiency, unspecified: Secondary | ICD-10-CM | POA: Diagnosis not present

## 2020-05-06 DIAGNOSIS — B20 Human immunodeficiency virus [HIV] disease: Secondary | ICD-10-CM | POA: Diagnosis not present

## 2020-05-06 DIAGNOSIS — I10 Essential (primary) hypertension: Secondary | ICD-10-CM | POA: Diagnosis not present

## 2020-05-06 DIAGNOSIS — R972 Elevated prostate specific antigen [PSA]: Secondary | ICD-10-CM | POA: Diagnosis not present

## 2020-05-06 DIAGNOSIS — I2510 Atherosclerotic heart disease of native coronary artery without angina pectoris: Secondary | ICD-10-CM | POA: Diagnosis not present

## 2020-05-06 DIAGNOSIS — Z1331 Encounter for screening for depression: Secondary | ICD-10-CM | POA: Diagnosis not present

## 2020-05-06 DIAGNOSIS — Z1389 Encounter for screening for other disorder: Secondary | ICD-10-CM | POA: Diagnosis not present

## 2020-05-06 DIAGNOSIS — J449 Chronic obstructive pulmonary disease, unspecified: Secondary | ICD-10-CM | POA: Diagnosis not present

## 2020-05-13 ENCOUNTER — Other Ambulatory Visit: Payer: Self-pay | Admitting: Internal Medicine

## 2020-05-13 DIAGNOSIS — B20 Human immunodeficiency virus [HIV] disease: Secondary | ICD-10-CM

## 2020-05-14 ENCOUNTER — Other Ambulatory Visit: Payer: Self-pay | Admitting: Cardiology

## 2020-05-14 ENCOUNTER — Telehealth: Payer: Self-pay

## 2020-05-17 NOTE — Telephone Encounter (Signed)
No refill attached.

## 2020-05-19 ENCOUNTER — Other Ambulatory Visit: Payer: Self-pay

## 2020-05-19 DIAGNOSIS — I10 Essential (primary) hypertension: Secondary | ICD-10-CM | POA: Diagnosis not present

## 2020-05-19 DIAGNOSIS — J449 Chronic obstructive pulmonary disease, unspecified: Secondary | ICD-10-CM | POA: Diagnosis not present

## 2020-05-19 MED ORDER — AMLODIPINE BESYLATE 5 MG PO TABS: 5.0000 mg | ORAL_TABLET | Freq: Every day | ORAL | 3 refills | Status: DC

## 2020-05-20 DIAGNOSIS — D751 Secondary polycythemia: Secondary | ICD-10-CM | POA: Diagnosis not present

## 2020-05-20 DIAGNOSIS — Z1329 Encounter for screening for other suspected endocrine disorder: Secondary | ICD-10-CM | POA: Diagnosis not present

## 2020-05-20 DIAGNOSIS — E559 Vitamin D deficiency, unspecified: Secondary | ICD-10-CM | POA: Diagnosis not present

## 2020-05-20 DIAGNOSIS — I10 Essential (primary) hypertension: Secondary | ICD-10-CM | POA: Diagnosis not present

## 2020-05-23 NOTE — Progress Notes (Signed)
Cardiology Office Note  Date: 05/24/2020   ID: Nicholas [name], DOB Jun 15, 1950, MRN [id number]  PCP:  Julian Nation, MD  Cardiologist:  Rozann Lesches, MD Electrophysiologist:  None   Chief Complaint:  F/U SOB, CAD, PAD, HLD, GI symptoms  History of Present Illness: Nicholas [name] is a 70 y.o. male with a history of SOB, CAD, PAD, HLD, GI symptoms.   Last seen by Dr. Domenic Polite via telemedicine 05/08/2019.  Patient did not report any anginal symptoms but stated he remained short of breath dating back to 2019 with ACS.  Also complained of intermittent ankle swelling.  Complained of abdominal protuberance and swelling as well as difficulty controlling his bowels.  He was referred to GI by PCP.  He has been seen by Dr. Gwenlyn Found in September last year due to claudication and abnormal ABI is predominantly left-sided.  He underwent angiography in October 2020 demonstrating short segment CTO of the mid left SFA with three-vessel runoff.  This was treated with directional atherectomy and drug coated balloon angioplasty.  He reported improvement in claudication-like symptoms.  Was seen by me in May 2021.  His blood pressures were elevated.  Coreg was increased to 12.5 mg p.o. twice daily.  HCTZ 25 mg daily was added.  He presents today for follow-up stating he had run out of his HCTZ, statin medication, and Plavix.  Blood pressure elevated at 172/88.  Heart rate 101.  States he significantly cut down on his cigarettes.  States he has only smoked 1 cigarette since Thanksgiving.  States she has a fair amount of stress in his life.  His good friend recently passed away from cirrhosis of the liver and had 2 sisters who passed away.  He denies any shortness of breath or DOE.  Denies any claudication-like symptoms, anginal symptoms.  Denies any bleeding.  States she has some swelling in his belly and his ankles.  Denies any CVA or TIA-like symptoms, orthostatic symptoms, PND, orthopnea.  Does have some lower  extremity nonpitting edema.  EKG today shows normal sinus rhythm with a rate of 99.   Past Medical History:  Diagnosis Date  . CAD (coronary artery disease)    Overlapping DES x2 to the mid to distal circumflex extending into OM 3 01/14/2018, DES x2 to the mid to distal RCA 01/16/2018  . Essential hypertension   . Grover's disease 2017  . HIV (human immunodeficiency virus infection) ([place])   . Polycythemia 2017  . Seasonal allergies 2017  . ST elevation myocardial infarction (STEMI) of inferior wall [facility])    November 2019  . Stroke (cerebrum) [facility])     Past Surgical History:  Procedure Laterality Date  . ABDOMINAL AORTOGRAM W/LOWER EXTREMITY Bilateral 12/02/2018   Procedure: ABDOMINAL AORTOGRAM W/LOWER EXTREMITY;  Surgeon: Lorretta Harp, MD;  Location: [place] [facility];  Service: Cardiovascular;  Laterality: Bilateral;  . CORONARY STENT INTERVENTION N/A 01/16/2018   Procedure: CORONARY STENT INTERVENTION;  Surgeon: Troy Sine, MD;  Location: [place] [facility];  Service: Cardiovascular;  Laterality: N/A;  . CORONARY/GRAFT ACUTE MI REVASCULARIZATION N/A 01/14/2018   Procedure: Coronary/Graft Acute MI Revascularization;  Surgeon: Nelva Bush, MD;  Location: [place] [facility];  Service: Cardiovascular;  Laterality: N/A;  . LEFT HEART CATH N/A 01/16/2018   Procedure: Left Heart Cath;  Surgeon: Troy Sine, MD;  Location: [place] [facility];  Service: Cardiovascular;  Laterality: N/A;  . LEFT HEART CATH AND CORONARY ANGIOGRAPHY N/A 01/14/2018   Procedure: LEFT  HEART CATH AND CORONARY ANGIOGRAPHY;  Surgeon: Nelva Bush, MD;  Location: [place] [facility];  Service: Cardiovascular;  Laterality: N/A;  . PERIPHERAL VASCULAR ATHERECTOMY  12/02/2018   Procedure: PERIPHERAL VASCULAR ATHERECTOMY;  Surgeon: Lorretta Harp, MD;  Location: [place] [facility];  Service: Cardiovascular;;  Left SFA  . PERIPHERAL VASCULAR BALLOON ANGIOPLASTY  12/02/2018   Procedure: PERIPHERAL  VASCULAR BALLOON ANGIOPLASTY;  Surgeon: Lorretta Harp, MD;  Location: [place] [facility];  Service: Cardiovascular;;  Left SFA  . ULTRASOUND GUIDANCE FOR VASCULAR ACCESS  01/16/2018   Procedure: Ultrasound Guidance For Vascular Access;  Surgeon: Troy Sine, MD;  Location: [place] [facility];  Service: Cardiovascular;;    Current Outpatient Medications  Medication Sig Dispense Refill  . amLODipine (NORVASC) 5 MG tablet Take 1 tablet (5 mg total) by mouth daily. 90 tablet 3  . aspirin EC 81 MG tablet Take 1 tablet (81 mg total) by mouth daily.    Marland Kitchen BIKTARVY 50-200-25 MG TABS tablet TAKE 1 TABLET BY MOUTH DAILY. 30 tablet 5  . carvedilol (COREG) 12.5 MG tablet TAKE 1 TABLET(12.5 MG) BY MOUTH TWICE DAILY 60 tablet 1  . Cholecalciferol (VITAMIN D3) 25 MCG (1000 UT) CAPS Take 1-2 capsules by mouth daily.    . clopidogrel (PLAVIX) 75 MG tablet TAKE 1 TABLET(75 MG) BY MOUTH DAILY. 7 tablet 0  . oxyCODONE-acetaminophen (PERCOCET/ROXICET) 5-325 MG tablet Take 1 tablet by mouth as needed for severe pain (back pain).    . STIOLTO RESPIMAT 2.5-2.5 MCG/ACT AERS Inhale 1 puff into the lungs daily.    . VENTOLIN HFA 108 (90 Base) MCG/ACT inhaler Take 1-2 puffs by mouth every 6 (six) hours as needed for shortness of breath or wheezing.  5  . atorvastatin (LIPITOR) 20 MG tablet TAKE 1 TABLET(20 MG) BY MOUTH DAILY (Patient not taking: Reported on 05/24/2020) 7 tablet 0  . hydrochlorothiazide (HYDRODIURIL) 25 MG tablet Take 1 tablet (25 mg total) by mouth daily. (Patient not taking: Reported on 05/24/2020) 30 tablet 1   No current facility-administered medications for this visit.   Allergies:  Penicillins   Social History: The patient  reports that he has been smoking cigarettes. He started smoking about 56 years ago. He has been smoking about 1.00 pack per day. He has never used smokeless tobacco. He reports previous alcohol use. He reports previous drug use. Drug: Marijuana.   Family History: The  patient's family history includes Heart disease in his mother.   ROS:  Please see the history of present illness. Otherwise, complete review of systems is positive for none.  All other systems are reviewed and negative.   Physical Exam: VS:  BP (!) 172/88   Pulse (!) 101   Ht 5' 10.5" (1.791 m)   Wt 202 lb 9.6 oz (91.9 kg)   SpO2 93%   BMI 28.66 kg/m , BMI Body mass index is 28.66 kg/m.  Wt Readings from Last 3 Encounters:  05/24/20 202 lb 9.6 oz (91.9 kg)  10/01/19 201 lb (91.2 kg)  06/30/19 197 lb 6.4 oz (89.5 kg)    General: Patient appears comfortable at rest. Neck: Supple, no elevated JVP or carotid bruits, no thyromegaly. Lungs: Clear to auscultation, nonlabored breathing at rest. Cardiac: Regular rate and rhythm, no S3 or significant systolic murmur, no pericardial rub. Extremities: No pitting edema, distal pulses 2+. Skin: Warm and dry. Musculoskeletal: No kyphosis. Neuropsychiatric: Alert and oriented x3, affect grossly appropriate.  ECG:  An ECG dated 05/24/2020  was personally reviewed today and demonstrated:  Sinus rhythm rate of 98.  Possible left atrial enlargement, junctional ST depression.  Probably normal  Recent Labwork: 06/12/2019: ALT 42; AST 31; BUN 18; Creat 1.34; Hemoglobin 18.2; Platelets 177; Potassium 3.8; Sodium 143     Component Value Date/Time   CHOL 137 06/12/2019 1516   TRIG 94 06/12/2019 1516   HDL 54 06/12/2019 1516   CHOLHDL 2.5 06/12/2019 1516   VLDL 16 01/14/2018 0745   LDLCALC 65 06/12/2019 1516    Other Studies Reviewed Today:  Peripheral vascular catheterization 12/02/2018  Angiographic Data:   1: Abdominal aortic-the renal arteries widely patent.  The infrarenal abdominal aorta had mild atherosclerotic changes. 2: Left lower extremity-short segment CTO mid left SFA with three-vessel runoff 3: Right lower extremity-40 to 50% proximal right external echo restenosis  IMPRESSION: Nicholas [name] has a short segment CTO mid left SFA  responsible for his claudication.  We will proceed with directional atherectomy followed by drug-coated balloon angioplasty using distal protection  Final Impression: Successful Hawk 1 directional arthrectomy followed by drug-coated balloon angioplasty of a short segment mid left SFA CTO using spider distal protection.  The patient was already on dual antiplatelet therapy including aspirin and Brilinta.  He will be hydrated overnight, discharged home in the morning.  We will get lower extremity arterial Doppler studies in our [facility] line office next week and I will see him back 2 to 3 weeks thereafter.  Cardiac Catheterization 01/16/2018 [place] Free heart sounds are limiting Conclusion    Dist RCA lesion is 85% stenosed.  Prox RCA lesion is 20% stenosed.  Mid RCA lesion is 95% stenosed.  Post intervention, there is a 0% residual stenosis.  Post intervention, there is a 0% residual stenosis.  A stent was successfully placed.  A stent was successfully placed.   Very difficult but ultimately successful PCI to the 95% mid RCA stenosis and the distal 85% RCA stenosis which appeared more significant since the initial evaluation.  A 2.25 x 15 mm Xience Sierra DES stent was inserted distally with post stent dilatation up to 2.6 mm.  A 2.5 x 15 mm Xience Sierra stent was inserted after much difficulty into the mid site with ultimate post stent dilatation up to 2.78 mm.  Post stent stenoses were reduced to 0%.  LVEDP 11 mmHg.  RECOMMENDATION: Continued medical therapy for concomitant CAD.  High potency statin therapy with target LDL less than 70.  Optimal blood pressure control with target blood pressure less than 130/80.  Post MI beta-blocker, ACE/ARB inhibition. Recommend uninterrupted dual antiplatelet therapy with Aspirin 81mg  daily and Ticagrelor 90mg  twice daily for a minimum of 12 months (ACS - Class I recommendation).  Diagnostic Dominance: Right  Intervention      Echocardiogram  05/13/2019 1. Left ventricular ejection fraction, by estimation, is 60 to 65%. The left ventricle has normal function. The left ventricle has no regional wall motion abnormalities. There is mild concentric left ventricular hypertrophy. Left ventricular diastolic parameters are consistent with Grade I diastolic dysfunction (impaired relaxation). 2. Right ventricular systolic function is normal. The right ventricular size is normal. There is normal pulmonary artery systolic pressure. 3. The mitral valve is grossly normal. Trivial mitral valve regurgitation. 4. The aortic valve is tricuspid. Aortic valve regurgitation is not visualized. No aortic stenosis is present. Comparison(s): Echocardiogram done 01/15/18 showed an EF of 60%.  Echocardiogram 01/15/2018: Study Conclusions - Left ventricle: The cavity size was normal. There was mild concentric hypertrophy. Systolic function  was normal. The estimated ejection fraction was in the range of 55% to 60%. Images were inadequate for LV wall motion assessment. Features are consistent with a pseudonormal left ventricular filling pattern, with concomitant abnormal relaxation and increased filling pressure (grade 2 diastolic dysfunction). - Left atrium: The atrium was mildly dilated. - Right atrium: The atrium was mildly dilated. - Pulmonary arteries: Systolic pressure could not be accurately estimated. - Inferior vena cava: The vessel was mildly dilated.  Assessment and Plan:  1. CAD in native artery   2. PAD (peripheral artery disease) ([place])   3. Mixed hyperlipidemia   4. Essential hypertension   5. Tobacco abuse    1. CAD in native artery Status post DES x 2 to mid and distal RCA 2019.  Denies any progressive anginal or exertional symptoms.  Continue aspirin 81 mg daily, Plavix 75 mg daily.  2. Mixed hyperlipidemia Recent lipid panel showed; TC 137, HDL 54, TG 94, LDL 65.  Continue atorvastatin 20 mg daily.  3.   Hypertension Blood pressure today 172/88.  States he ran out of his HCTZ.  Increase Coreg to 25 mg p.o. twice daily.  Continue HCTZ 25 mg p.o. daily.  Continue amlodipine 5 mg daily.  Follow-up in 2 weeks.  Advised patient to continue checking his blood pressure and bring a log of his blood pressures with him at next visit.  4.  Smoking He admits to continued smoking.  He has only smoked 1 cigarette since Thanksgiving.   Medication Adjustments/Labs and Tests Ordered: Current medicines are reviewed at length with the patient today.  Concerns regarding medicines are outlined above.   Disposition: Follow-up with Dr Domenic Polite or APP 2 weeks Signed, Levell July, NP 05/24/2020 3:51 PM    [facility] [place], [place], [place] [address] Phone: [phone]; Fax: [phone]

## 2020-05-24 ENCOUNTER — Encounter: Payer: Self-pay | Admitting: Family Medicine

## 2020-05-24 ENCOUNTER — Ambulatory Visit: Payer: Medicare HMO | Admitting: Family Medicine

## 2020-05-24 ENCOUNTER — Other Ambulatory Visit: Payer: Self-pay

## 2020-05-24 VITALS — BP 172/88 | HR 101 | Ht 70.5 in | Wt 202.6 lb

## 2020-05-24 DIAGNOSIS — I10 Essential (primary) hypertension: Secondary | ICD-10-CM | POA: Diagnosis not present

## 2020-05-24 DIAGNOSIS — E782 Mixed hyperlipidemia: Secondary | ICD-10-CM | POA: Diagnosis not present

## 2020-05-24 DIAGNOSIS — Z720 Tobacco use: Secondary | ICD-10-CM | POA: Diagnosis not present

## 2020-05-24 DIAGNOSIS — I2510 Atherosclerotic heart disease of native coronary artery without angina pectoris: Secondary | ICD-10-CM

## 2020-05-24 DIAGNOSIS — I739 Peripheral vascular disease, unspecified: Secondary | ICD-10-CM

## 2020-05-24 MED ORDER — CARVEDILOL 25 MG PO TABS: 25.0000 mg | ORAL_TABLET | Freq: Two times a day (BID) | ORAL | 3 refills | Status: DC

## 2020-05-24 MED ORDER — ATORVASTATIN CALCIUM 20 MG PO TABS: 20.0000 mg | ORAL_TABLET | Freq: Every day | ORAL | 3 refills | Status: DC

## 2020-05-24 MED ORDER — CLOPIDOGREL BISULFATE 75 MG PO TABS: 75.0000 mg | ORAL_TABLET | Freq: Every day | ORAL | 3 refills | Status: DC

## 2020-05-24 MED ORDER — HYDROCHLOROTHIAZIDE 25 MG PO TABS: 25.0000 mg | ORAL_TABLET | Freq: Every day | ORAL | 3 refills | Status: DC

## 2020-05-24 NOTE — Patient Instructions (Addendum)
Medication Instructions:   Increase Coreg to 25mg  twice a day.  Continue all other medications.    Labwork: none  Testing/Procedures: none  Follow-Up: 2 weeks   Any Other Special Instructions Will Be Listed Below (If Applicable).  If you need a refill on your cardiac medications before your next appointment, please call your pharmacy.

## 2020-05-25 ENCOUNTER — Encounter: Payer: Self-pay | Admitting: Family Medicine

## 2020-05-25 NOTE — Addendum Note (Signed)
Addended by: Merlene Laughter on: 05/25/2020 03:42 PM   Modules accepted: Orders

## 2020-06-03 DIAGNOSIS — D751 Secondary polycythemia: Secondary | ICD-10-CM | POA: Diagnosis not present

## 2020-06-08 ENCOUNTER — Ambulatory Visit: Payer: Medicare HMO | Admitting: Family Medicine

## 2020-06-09 DIAGNOSIS — I10 Essential (primary) hypertension: Secondary | ICD-10-CM | POA: Diagnosis not present

## 2020-06-09 DIAGNOSIS — J449 Chronic obstructive pulmonary disease, unspecified: Secondary | ICD-10-CM | POA: Diagnosis not present

## 2020-06-09 NOTE — Progress Notes (Signed)
Cardiology Office Note  Date: 06/10/2020   ID: Nicholas [name], DOB [birth year], MRN [id number]  PCP:  Roscoe Nation, MD  Cardiologist:  Rozann Lesches, MD Electrophysiologist:  None   Chief Complaint:  F/U SOB, CAD, PAD, HLD, GI symptoms  History of Present Illness: Nicholas [name] is a 70 y.o. male with a history of SOB, CAD, PAD, HLD, GI symptoms.   Last seen by Dr. Domenic Polite via telemedicine 05/08/2019.  Patient did not report any anginal symptoms but stated he remained short of breath dating back to 2019 with ACS.  Also complained of intermittent ankle swelling.  Complained of abdominal protuberance and swelling as well as difficulty controlling his bowels.  He was referred to GI by PCP.  He has been seen by Dr. Gwenlyn Found in September last year due to claudication and abnormal ABI is predominantly left-sided.  He underwent angiography in October 2020 demonstrating short segment CTO of the mid left SFA with three-vessel runoff.  This was treated with directional atherectomy and drug coated balloon angioplasty.  He reported improvement in claudication-like symptoms.  Was seen by me in May 2021.  His blood pressures were elevated.  Coreg was increased to 12.5 mg p.o. twice daily.  HCTZ 25 mg daily was added.  Last visit he presented for follow-up stating he had run out of his HCTZ, statin medication, and Plavix.  Blood pressure elevated at 172/88.  Heart rate 101.  He significantly cut down on his cigarettes.  He had only smoked 1 cigarette since Thanksgiving.  He was having a fair amount of stress in his life.  His good friend recently passed away from cirrhosis of the liver and had 2 sisters who passed away.  He denies any shortness of breath or DOE.  No claudication-like symptoms, anginal symptoms.  Denies any bleeding.  He had some swelling in his belly and his ankles.  Denies any CVA or TIA-like symptoms, orthostatic symptoms, PND, orthopnea..  Have some lower extremity nonpitting edema.   EKG showed normal sinus rhythm with a rate of 99.  He presents today for follow-up.  Blood pressure is much improved.  BP today 128/72.  He states the swelling in his ankles has decreased as well as his belly.  He denies any anginal or exertional symptoms, palpitations or arrhythmias, orthostatic symptoms, CVA or TIA-like symptoms, PND, orthopnea, bleeding.  Denies any claudication-like symptoms, DVT or PE-like symptoms, or lower extremity edema.  He is continuing his antihypertensive medications as directed.  States he is feeling much better.   Past Medical History:  Diagnosis Date  . CAD (coronary artery disease)    Overlapping DES x2 to the mid to distal circumflex extending into OM 3 01/14/2018, DES x2 to the mid to distal RCA 01/16/2018  . Essential hypertension   . Grover's disease 2017  . HIV (human immunodeficiency virus infection) ([place])   . Polycythemia 2017  . Seasonal allergies 2017  . ST elevation myocardial infarction (STEMI) of inferior wall [facility])    November 2019  . Stroke (cerebrum) [facility])     Past Surgical History:  Procedure Laterality Date  . ABDOMINAL AORTOGRAM W/LOWER EXTREMITY Bilateral 12/02/2018   Procedure: ABDOMINAL AORTOGRAM W/LOWER EXTREMITY;  Surgeon: Lorretta Harp, MD;  Location: [place] [facility] LAB;  Service: Cardiovascular;  Laterality: Bilateral;  . CORONARY STENT INTERVENTION N/A 01/16/2018   Procedure: CORONARY STENT INTERVENTION;  Surgeon: Troy Sine, MD;  Location: [place] [facility];  Service: Cardiovascular;  Laterality: N/A;  .  CORONARY/GRAFT ACUTE MI REVASCULARIZATION N/A 01/14/2018   Procedure: Coronary/Graft Acute MI Revascularization;  Surgeon: Nelva Bush, MD;  Location: [place] [facility];  Service: Cardiovascular;  Laterality: N/A;  . LEFT HEART CATH N/A 01/16/2018   Procedure: Left Heart Cath;  Surgeon: Troy Sine, MD;  Location: [place] [facility] LAB;  Service: Cardiovascular;  Laterality: N/A;  . LEFT HEART CATH AND CORONARY  ANGIOGRAPHY N/A 01/14/2018   Procedure: LEFT HEART CATH AND CORONARY ANGIOGRAPHY;  Surgeon: Nelva Bush, MD;  Location: [place] [facility] LAB;  Service: Cardiovascular;  Laterality: N/A;  . PERIPHERAL VASCULAR ATHERECTOMY  12/02/2018   Procedure: PERIPHERAL VASCULAR ATHERECTOMY;  Surgeon: Lorretta Harp, MD;  Location: [place] [facility] LAB;  Service: Cardiovascular;;  Left SFA  . PERIPHERAL VASCULAR BALLOON ANGIOPLASTY  12/02/2018   Procedure: PERIPHERAL VASCULAR BALLOON ANGIOPLASTY;  Surgeon: Lorretta Harp, MD;  Location: [place] [facility] LAB;  Service: Cardiovascular;;  Left SFA  . ULTRASOUND GUIDANCE FOR VASCULAR ACCESS  01/16/2018   Procedure: Ultrasound Guidance For Vascular Access;  Surgeon: Troy Sine, MD;  Location: [place] [facility] LAB;  Service: Cardiovascular;;    Current Outpatient Medications  Medication Sig Dispense Refill  . amLODipine (NORVASC) 5 MG tablet Take 1 tablet (5 mg total) by mouth daily. 90 tablet 3  . aspirin EC 81 MG tablet Take 1 tablet (81 mg total) by mouth daily.    Marland Kitchen atorvastatin (LIPITOR) 20 MG tablet Take 1 tablet (20 mg total) by mouth daily. 90 tablet 3  . BIKTARVY 50-200-25 MG TABS tablet TAKE 1 TABLET BY MOUTH DAILY. 30 tablet 5  . carvedilol (COREG) 25 MG tablet Take 1 tablet (25 mg total) by mouth 2 (two) times daily. 180 tablet 3  . Cholecalciferol (VITAMIN D3) 25 MCG (1000 UT) CAPS Take 1-2 capsules by mouth daily.    . clopidogrel (PLAVIX) 75 MG tablet Take 1 tablet (75 mg total) by mouth daily. 90 tablet 3  . hydrochlorothiazide (HYDRODIURIL) 25 MG tablet Take 1 tablet (25 mg total) by mouth daily. 90 tablet 3  . oxyCODONE-acetaminophen (PERCOCET/ROXICET) 5-325 MG tablet Take 1 tablet by mouth as needed for severe pain (back pain).    . STIOLTO RESPIMAT 2.5-2.5 MCG/ACT AERS Inhale 1 puff into the lungs daily.    . VENTOLIN HFA 108 (90 Base) MCG/ACT inhaler Take 1-2 puffs by mouth every 6 (six) hours as needed for shortness of breath or  wheezing.  5   No current facility-administered medications for this visit.   Allergies:  Penicillins   Social History: The patient  reports that he has been smoking cigarettes. He started smoking about 56 years ago. He has been smoking about 1.00 pack per day. He has never used smokeless tobacco. He reports previous alcohol use. He reports previous drug use. Drug: Marijuana.   Family History: The patient's family history includes Heart disease in his mother.   ROS:  Please see the history of present illness. Otherwise, complete review of systems is positive for none.  All other systems are reviewed and negative.   Physical Exam: VS:  BP 128/72   Pulse 80   Ht 5' 10.5" (1.791 m)   Wt 201 lb 9.6 oz (91.4 kg)   SpO2 94%   BMI 28.52 kg/m , BMI Body mass index is 28.52 kg/m.  Wt Readings from Last 3 Encounters:  06/10/20 201 lb 9.6 oz (91.4 kg)  05/24/20 202 lb 9.6 oz (91.9 kg)  10/01/19 201 lb (91.2 kg)  General: Patient appears comfortable at rest. Neck: Supple, no elevated JVP or carotid bruits, no thyromegaly. Lungs: Clear to auscultation, nonlabored breathing at rest. Cardiac: Regular rate and rhythm, no S3 or significant systolic murmur, no pericardial rub. Extremities: No pitting edema, distal pulses 2+. Skin: Warm and dry. Musculoskeletal: No kyphosis. Neuropsychiatric: Alert and oriented x3, affect grossly appropriate.  ECG:  An ECG dated 05/24/2020 was personally reviewed today and demonstrated:  Sinus rhythm rate of 98.  Possible left atrial enlargement, junctional ST depression.  Probably normal  Recent Labwork: 06/12/2019: ALT 42; AST 31; BUN 18; Creat 1.34; Hemoglobin 18.2; Platelets 177; Potassium 3.8; Sodium 143     Component Value Date/Time   CHOL 137 06/12/2019 1516   TRIG 94 06/12/2019 1516   HDL 54 06/12/2019 1516   CHOLHDL 2.5 06/12/2019 1516   VLDL 16 01/14/2018 0745   LDLCALC 65 06/12/2019 1516    Other Studies Reviewed Today:  Peripheral vascular  catheterization 12/02/2018  Angiographic Data:   1: Abdominal aortic-the renal arteries widely patent.  The infrarenal abdominal aorta had mild atherosclerotic changes. 2: Left lower extremity-short segment CTO mid left SFA with three-vessel runoff 3: Right lower extremity-40 to 50% proximal right external echo restenosis  IMPRESSION: Mr. Serio has a short segment CTO mid left SFA responsible for his claudication.  We will proceed with directional atherectomy followed by drug-coated balloon angioplasty using distal protection  Final Impression: Successful [place] 1 directional arthrectomy followed by drug-coated balloon angioplasty of a short segment mid left SFA CTO using spider distal protection.  The patient was already on dual antiplatelet therapy including aspirin and Brilinta.  He will be hydrated overnight, discharged home in the morning.  We will get lower extremity arterial Doppler studies in our [facility] line office next week and I will see him back 2 to 3 weeks thereafter.  Cardiac Catheterization 01/16/2018 [place] Free heart sounds are limiting Conclusion    Dist RCA lesion is 85% stenosed.  Prox RCA lesion is 20% stenosed.  Mid RCA lesion is 95% stenosed.  Post intervention, there is a 0% residual stenosis.  Post intervention, there is a 0% residual stenosis.  A stent was successfully placed.  A stent was successfully placed.   Very difficult but ultimately successful PCI to the 95% mid RCA stenosis and the distal 85% RCA stenosis which appeared more significant since the initial evaluation.  A 2.25 x 15 mm Xience Sierra DES stent was inserted distally with post stent dilatation up to 2.6 mm.  A 2.5 x 15 mm Xience Sierra stent was inserted after much difficulty into the mid site with ultimate post stent dilatation up to 2.78 mm.  Post stent stenoses were reduced to 0%.  LVEDP 11 mmHg.  RECOMMENDATION: Continued medical therapy for concomitant CAD.  High potency statin  therapy with target LDL less than 70.  Optimal blood pressure control with target blood pressure less than 130/80.  Post MI beta-blocker, ACE/ARB inhibition. Recommend uninterrupted dual antiplatelet therapy with Aspirin 81mg  daily and Ticagrelor 90mg  twice daily for a minimum of 12 months (ACS - Class I recommendation).  Diagnostic Dominance: Right  Intervention      Echocardiogram 05/13/2019 1. Left ventricular ejection fraction, by estimation, is 60 to 65%. The left ventricle has normal function. The left ventricle has no regional wall motion abnormalities. There is mild concentric left ventricular hypertrophy. Left ventricular diastolic parameters are consistent with Grade I diastolic dysfunction (impaired relaxation). 2. Right ventricular systolic function is normal. The right ventricular  size is normal. There is normal pulmonary artery systolic pressure. 3. The mitral valve is grossly normal. Trivial mitral valve regurgitation. 4. The aortic valve is tricuspid. Aortic valve regurgitation is not visualized. No aortic stenosis is present. Comparison(s): Echocardiogram done 01/15/18 showed an EF of 60%.  Echocardiogram 01/15/2018: Study Conclusions - Left ventricle: The cavity size was normal. There was mild concentric hypertrophy. Systolic function was normal. The estimated ejection fraction was in the range of 55% to 60%. Images were inadequate for LV wall motion assessment. Features are consistent with a pseudonormal left ventricular filling pattern, with concomitant abnormal relaxation and increased filling pressure (grade 2 diastolic dysfunction). - Left atrium: The atrium was mildly dilated. - Right atrium: The atrium was mildly dilated. - Pulmonary arteries: Systolic pressure could not be accurately estimated. - Inferior vena cava: The vessel was mildly dilated.  Assessment and Plan:  1. CAD in native artery   2. Mixed hyperlipidemia   3. Essential  hypertension   4. Smoking    1. CAD in native artery Status post DES x 2 to mid and distal RCA 2019.  Denies any progressive anginal or exertional symptoms.  Continue aspirin 81 mg daily, Plavix 75 mg daily.  2. Mixed hyperlipidemia Recent lipid panel showed; TC 137, HDL 54, TG 94, LDL 65.  Continue atorvastatin 20 mg daily.  3.  Hypertension Blood pressure much better today at 128/72.  Continue Coreg to 25 mg p.o. twice daily.  Continue HCTZ 25 mg p.o. daily.  Continue amlodipine 5 mg daily.  Mild edema in lower extremities has resolved.  4.  Smoking He admits to continued smoking.  He has only smoked 1 cigarette since Thanksgiving.  He has significantly cut down on his smoking.  Continue to progress toward cessation.   Medication Adjustments/Labs and Tests Ordered: Current medicines are reviewed at length with the patient today.  Concerns regarding medicines are outlined above.   Disposition: Follow-up with Dr Domenic Polite or APP 6 months   signed, Levell July, NP 06/10/2020 10:55 AM    [facility] [place], [place], [place] [address] Phone: [phone]; Fax: [phone]

## 2020-06-10 ENCOUNTER — Ambulatory Visit: Payer: Medicare HMO | Admitting: Family Medicine

## 2020-06-10 ENCOUNTER — Other Ambulatory Visit: Payer: Self-pay

## 2020-06-10 ENCOUNTER — Encounter: Payer: Self-pay | Admitting: Family Medicine

## 2020-06-10 VITALS — BP 128/72 | HR 80 | Ht 70.5 in | Wt 201.6 lb

## 2020-06-10 DIAGNOSIS — I2510 Atherosclerotic heart disease of native coronary artery without angina pectoris: Secondary | ICD-10-CM | POA: Diagnosis not present

## 2020-06-10 DIAGNOSIS — E782 Mixed hyperlipidemia: Secondary | ICD-10-CM

## 2020-06-10 DIAGNOSIS — F17200 Nicotine dependence, unspecified, uncomplicated: Secondary | ICD-10-CM

## 2020-06-10 DIAGNOSIS — I10 Essential (primary) hypertension: Secondary | ICD-10-CM

## 2020-06-10 NOTE — Patient Instructions (Signed)
Medication Instructions:  Continue all current medications.   Labwork: none  Testing/Procedures: none  Follow-Up: 6 months   Any Other Special Instructions Will Be Listed Below (If Applicable).   If you need a refill on your cardiac medications before your next appointment, please call your pharmacy.  

## 2020-06-18 ENCOUNTER — Institutional Professional Consult (permissible substitution): Payer: Medicare HMO | Admitting: Emergency Medicine

## 2020-07-27 DIAGNOSIS — J449 Chronic obstructive pulmonary disease, unspecified: Secondary | ICD-10-CM | POA: Diagnosis not present

## 2020-07-27 DIAGNOSIS — I10 Essential (primary) hypertension: Secondary | ICD-10-CM | POA: Diagnosis not present

## 2020-07-27 DIAGNOSIS — E559 Vitamin D deficiency, unspecified: Secondary | ICD-10-CM | POA: Diagnosis not present

## 2020-07-27 DIAGNOSIS — R972 Elevated prostate specific antigen [PSA]: Secondary | ICD-10-CM | POA: Diagnosis not present

## 2020-07-27 DIAGNOSIS — F17200 Nicotine dependence, unspecified, uncomplicated: Secondary | ICD-10-CM | POA: Diagnosis not present

## 2020-07-27 DIAGNOSIS — Z6829 Body mass index (BMI) 29.0-29.9, adult: Secondary | ICD-10-CM | POA: Diagnosis not present

## 2020-07-27 DIAGNOSIS — I2510 Atherosclerotic heart disease of native coronary artery without angina pectoris: Secondary | ICD-10-CM | POA: Diagnosis not present

## 2020-07-27 DIAGNOSIS — B20 Human immunodeficiency virus [HIV] disease: Secondary | ICD-10-CM | POA: Diagnosis not present

## 2020-08-31 DIAGNOSIS — F17200 Nicotine dependence, unspecified, uncomplicated: Secondary | ICD-10-CM | POA: Diagnosis not present

## 2020-08-31 DIAGNOSIS — I2510 Atherosclerotic heart disease of native coronary artery without angina pectoris: Secondary | ICD-10-CM | POA: Diagnosis not present

## 2020-08-31 DIAGNOSIS — J449 Chronic obstructive pulmonary disease, unspecified: Secondary | ICD-10-CM | POA: Diagnosis not present

## 2020-08-31 DIAGNOSIS — I10 Essential (primary) hypertension: Secondary | ICD-10-CM | POA: Diagnosis not present

## 2020-08-31 DIAGNOSIS — B20 Human immunodeficiency virus [HIV] disease: Secondary | ICD-10-CM | POA: Diagnosis not present

## 2020-08-31 DIAGNOSIS — Z6829 Body mass index (BMI) 29.0-29.9, adult: Secondary | ICD-10-CM | POA: Diagnosis not present

## 2020-08-31 DIAGNOSIS — R972 Elevated prostate specific antigen [PSA]: Secondary | ICD-10-CM | POA: Diagnosis not present

## 2020-08-31 DIAGNOSIS — E559 Vitamin D deficiency, unspecified: Secondary | ICD-10-CM | POA: Diagnosis not present

## 2020-09-13 ENCOUNTER — Other Ambulatory Visit (HOSPITAL_COMMUNITY): Payer: Self-pay | Admitting: Neurosurgery

## 2020-09-13 DIAGNOSIS — M5416 Radiculopathy, lumbar region: Secondary | ICD-10-CM

## 2020-09-13 DIAGNOSIS — I10 Essential (primary) hypertension: Secondary | ICD-10-CM | POA: Diagnosis not present

## 2020-09-13 DIAGNOSIS — Z6827 Body mass index (BMI) 27.0-27.9, adult: Secondary | ICD-10-CM | POA: Diagnosis not present

## 2020-09-14 DIAGNOSIS — G4734 Idiopathic sleep related nonobstructive alveolar hypoventilation: Secondary | ICD-10-CM | POA: Diagnosis not present

## 2020-09-14 DIAGNOSIS — D751 Secondary polycythemia: Secondary | ICD-10-CM | POA: Diagnosis not present

## 2020-09-14 DIAGNOSIS — D45 Polycythemia vera: Secondary | ICD-10-CM | POA: Diagnosis not present

## 2020-09-14 DIAGNOSIS — R799 Abnormal finding of blood chemistry, unspecified: Secondary | ICD-10-CM | POA: Diagnosis not present

## 2020-09-20 ENCOUNTER — Other Ambulatory Visit: Payer: Medicare HMO

## 2020-09-29 ENCOUNTER — Other Ambulatory Visit: Payer: Self-pay

## 2020-09-29 ENCOUNTER — Ambulatory Visit (HOSPITAL_COMMUNITY)
Admission: RE | Admit: 2020-09-29 | Discharge: 2020-09-29 | Disposition: A | Discharge status: Home / Self Care | Payer: Medicare HMO | Source: Ambulatory Visit | Attending: Neurosurgery | Admitting: Neurosurgery

## 2020-09-29 DIAGNOSIS — M5416 Radiculopathy, lumbar region: Secondary | ICD-10-CM | POA: Insufficient documentation

## 2020-09-29 DIAGNOSIS — M545 Low back pain, unspecified: Secondary | ICD-10-CM | POA: Diagnosis not present

## 2020-10-01 DIAGNOSIS — Z1329 Encounter for screening for other suspected endocrine disorder: Secondary | ICD-10-CM | POA: Diagnosis not present

## 2020-10-01 DIAGNOSIS — D751 Secondary polycythemia: Secondary | ICD-10-CM | POA: Diagnosis not present

## 2020-10-01 DIAGNOSIS — I129 Hypertensive chronic kidney disease with stage 1 through stage 4 chronic kidney disease, or unspecified chronic kidney disease: Secondary | ICD-10-CM | POA: Diagnosis not present

## 2020-10-01 DIAGNOSIS — N189 Chronic kidney disease, unspecified: Secondary | ICD-10-CM | POA: Diagnosis not present

## 2020-10-01 DIAGNOSIS — Z720 Tobacco use: Secondary | ICD-10-CM | POA: Diagnosis not present

## 2020-10-06 ENCOUNTER — Encounter: Payer: Medicare HMO | Admitting: Internal Medicine

## 2020-10-11 DIAGNOSIS — M6281 Muscle weakness (generalized): Secondary | ICD-10-CM | POA: Diagnosis not present

## 2020-10-11 DIAGNOSIS — M545 Low back pain, unspecified: Secondary | ICD-10-CM | POA: Diagnosis not present

## 2020-10-14 ENCOUNTER — Encounter: Payer: Self-pay | Admitting: Internal Medicine

## 2020-10-14 ENCOUNTER — Other Ambulatory Visit: Payer: Self-pay

## 2020-10-14 ENCOUNTER — Ambulatory Visit: Payer: Medicare HMO | Admitting: Internal Medicine

## 2020-10-14 ENCOUNTER — Ambulatory Visit (INDEPENDENT_AMBULATORY_CARE_PROVIDER_SITE_OTHER): Payer: Medicare HMO

## 2020-10-14 DIAGNOSIS — Z23 Encounter for immunization: Secondary | ICD-10-CM | POA: Diagnosis not present

## 2020-10-14 DIAGNOSIS — B20 Human immunodeficiency virus [HIV] disease: Secondary | ICD-10-CM

## 2020-10-14 DIAGNOSIS — F3342 Major depressive disorder, recurrent, in full remission: Secondary | ICD-10-CM | POA: Diagnosis not present

## 2020-10-14 DIAGNOSIS — F17210 Nicotine dependence, cigarettes, uncomplicated: Secondary | ICD-10-CM | POA: Diagnosis not present

## 2020-10-14 DIAGNOSIS — Z113 Encounter for screening for infections with a predominantly sexual mode of transmission: Secondary | ICD-10-CM | POA: Diagnosis not present

## 2020-10-14 MED ORDER — BIKTARVY 50-200-25 MG PO TABS: 1.0000 | ORAL_TABLET | Freq: Every day | ORAL | 11 refills | Status: DC

## 2020-10-14 NOTE — Assessment & Plan Note (Signed)
His infection has been under excellent, long-term control.  He will get repeat lab work today, continue [place] and follow-up in 1 year.  He is not at risk for monkey pox.  We gave him a COVID booster vaccine today.

## 2020-10-14 NOTE — Progress Notes (Signed)
   Covid-19 Vaccination Clinic  Name:  Nicholas [name]    MRN: [id number] DOB: Oct 26, 1950  10/14/2020  [name] was observed post Covid-19 immunization for 15 minutes without incident. He was provided with Vaccine Information Sheet and instruction to access the V-Safe system.   [name] was instructed to call 911 with any severe reactions post vaccine: Difficulty breathing  Swelling of face and throat  A fast heartbeat  A bad rash all over body  Dizziness and weakness   Immunizations Administered     Name Date Dose VIS Date Route   PFIZER Comrnaty([place] TOP) Covid-19 Vaccine 10/14/2020  4:21 PM 0.3 mL 01/29/2020 Intramuscular   Manufacturer: [place]   Lot: [id number]   NDC: [place]

## 2020-10-14 NOTE — Assessment & Plan Note (Signed)
I encouraged him to make every effort to quit again.

## 2020-10-14 NOTE — Assessment & Plan Note (Signed)
His depression is in remission. 

## 2020-10-14 NOTE — Progress Notes (Signed)
Patient Active Problem List   Diagnosis Date Noted   Coronary artery disease involving native coronary artery of native heart with unstable angina pectoris ([place])     Priority: High   HIV disease ([place]) 06/06/2017    Priority: High   Claudication in peripheral vascular disease ([place]) 12/02/2018   Peripheral arterial disease ([place]) 11/13/2018   Hyperlipidemia LDL goal <70 01/17/2018   STEMI (ST elevation myocardial infarction) ([place]) 01/14/2018   STEMI involving left circumflex coronary artery ([place]) 01/14/2018   Dyslipidemia 12/04/2017   Cigarette smoker 06/07/2017   HTN (hypertension) 06/07/2017   History of CVA (cerebrovascular accident) 06/07/2017   Seasonal allergies 06/07/2017   Grover's disease 06/07/2017   Depression 06/06/2017   CKD (chronic kidney disease) stage 3, GFR 30-59 ml/min ([place]) 06/06/2017   Polycythemia 06/06/2017    Patient's Medications  New Prescriptions   No medications on file  Previous Medications   AMLODIPINE (NORVASC) 5 MG TABLET    Take 1 tablet (5 mg total) by mouth daily.   ASPIRIN EC 81 MG TABLET    Take 1 tablet (81 mg total) by mouth daily.   ATORVASTATIN (LIPITOR) 20 MG TABLET    Take 1 tablet (20 mg total) by mouth daily.   BACLOFEN (LIORESAL) 10 MG TABLET    Take 10 mg by mouth daily.   CARVEDILOL (COREG) 25 MG TABLET    Take 1 tablet (25 mg total) by mouth 2 (two) times daily.   CHOLECALCIFEROL (VITAMIN D3) 25 MCG (1000 UT) CAPS    Take 1-2 capsules by mouth daily.   CLOPIDOGREL (PLAVIX) 75 MG TABLET    Take 1 tablet (75 mg total) by mouth daily.   ERGOCALCIFEROL (VITAMIN D2) 1.25 MG (50000 UT) CAPSULE    Take 1 capsule by mouth once a week.   HYDROCHLOROTHIAZIDE (HYDRODIURIL) 25 MG TABLET    Take 1 tablet (25 mg total) by mouth daily.   LOSARTAN (COZAAR) 25 MG TABLET    Take 25 mg by mouth daily.   NICOTINE (NICODERM CQ - DOSED IN MG/24 HOURS) 21 MG/24HR PATCH    Place onto the skin.   NICOTINE POLACRILEX (COMMIT) 4 MG LOZENGE     Place inside cheek.   OXYCODONE-ACETAMINOPHEN (PERCOCET/ROXICET) 5-325 MG TABLET    Take 1 tablet by mouth as needed for severe pain (back pain).   STIOLTO RESPIMAT 2.5-2.5 MCG/ACT AERS    Inhale 1 puff into the lungs daily.   VENTOLIN HFA 108 (90 BASE) MCG/ACT INHALER    Take 1-2 puffs by mouth every 6 (six) hours as needed for shortness of breath or wheezing.  Modified Medications   Modified Medication Previous Medication   BICTEGRAVIR-EMTRICITABINE-TENOFOVIR AF (BIKTARVY) 50-200-25 MG TABS TABLET BIKTARVY 50-200-25 MG TABS tablet      Take 1 tablet by mouth daily.    TAKE 1 TABLET BY MOUTH DAILY.  Discontinued Medications   CLOPIDOGREL (PLAVIX) 75 MG TABLET    Take 75 mg by mouth daily.    Subjective: Nicholas [name] is in for his routine HIV follow-up visit.  He has not had any problems obtaining, taking or tolerating his Biktarvy and has not missed any doses in the past year.  He is feeling well other than some low back pain that he is being treated for.  He recently had a MRI which showed some mild degenerative changes in his lumbar spine.  He quit smoking cigarettes for a period of time but is now back  smoking intermittently.  He denies feeling anxious or depressed.  He is happy that he has been able to lose some weight.  He said that he is interested in getting the momkey pox vaccine.  However, he is not sexually active and is not planning to become sexually active.  He received his first 2 COVID vaccines last year but has not had a booster.  Review of Systems: Review of Systems  Constitutional:  Positive for weight loss. Negative for fever.  Respiratory:  Positive for cough and shortness of breath.   Cardiovascular:  Negative for chest pain.  Musculoskeletal:  Positive for back pain.  Psychiatric/Behavioral:  Negative for depression. The patient is not nervous/anxious.    Past Medical History:  Diagnosis Date   CAD (coronary artery disease)    Overlapping DES x2 to the mid to distal  circumflex extending into OM 3 01/14/2018, DES x2 to the mid to distal RCA 01/16/2018   Essential hypertension    Grover's disease 2017   HIV (human immunodeficiency virus infection) ([place])    Polycythemia 2017   Seasonal allergies 2017   ST elevation myocardial infarction (STEMI) of inferior wall [facility])    November 2019   Stroke (cerebrum) [facility])     Social History   Tobacco Use   Smoking status: Some Days    Packs/day: 1.00    Types: Cigarettes    Start date: 04/30/1964   Smokeless tobacco: Never   Tobacco comments:    not smoked since Thanksgiving  Substance Use Topics   Alcohol use: Not Currently   Drug use: Not Currently    Types: Marijuana    Family History  Problem Relation Age of Onset   Heart disease Mother     Allergies  Allergen Reactions   Penicillins Anaphylaxis    High fever  Did it involve swelling of the face/tongue/throat, SOB, or low BP? No Did it involve sudden or severe rash/hives, skin peeling, or any reaction on the inside of your mouth or nose? No Did you need to seek medical attention at a hospital or doctor's office? Yes When did it last happen?      45 + years If all above answers are "NO", may proceed with cephalosporin use.      Health Maintenance  Topic Date Due   COVID-19 Vaccine (1) Never done   TETANUS/TDAP  Never done   Zoster Vaccines- Shingrix (1 of 2) Never done   COLONOSCOPY (Pts 45-55yr Insurance coverage will need to be confirmed)  Never done   PNA vac Low Risk Adult (2 of 2 - PCV13) 12/03/2019   INFLUENZA VACCINE  09/20/2020   Hepatitis C Screening  Completed   HPV VACCINES  Aged Out    Objective:  Vitals:   10/14/20 1111  Pulse: 75  Temp: 98 F (36.7 C)  TempSrc: Oral  Weight: 190 lb (86.2 kg)   Body mass index is 26.88 kg/m.  Physical Exam Constitutional:      Comments: He is in good spirits.  His weight is down 11 pounds over the past year  Cardiovascular:     Rate and Rhythm: Normal rate and regular  rhythm.     Heart sounds: No murmur heard. Pulmonary:     Effort: Pulmonary effort is normal.     Breath sounds: Normal breath sounds.  Psychiatric:        Mood and Affect: Mood normal.    Lab Results Lab Results  Component Value Date   WBC 8.6  06/12/2019   HGB 18.2 (H) 06/12/2019   HCT 52.0 (H) 06/12/2019   MCV 102.6 (H) 06/12/2019   PLT 177 06/12/2019    Lab Results  Component Value Date   CREATININE 1.34 (H) 06/12/2019   BUN 18 06/12/2019   NA 143 06/12/2019   K 3.8 06/12/2019   CL 108 06/12/2019   CO2 24 06/12/2019    Lab Results  Component Value Date   ALT 42 06/12/2019   AST 31 06/12/2019   ALKPHOS 122 (H) 04/23/2018   BILITOT 0.6 06/12/2019    Lab Results  Component Value Date   CHOL 137 06/12/2019   HDL 54 06/12/2019   LDLCALC 65 06/12/2019   TRIG 94 06/12/2019   CHOLHDL 2.5 06/12/2019   Lab Results  Component Value Date   LABRPR NON-REACTIVE 06/12/2019   HIV 1 RNA Quant  Date Value  06/12/2019 <20 NOT DETECTED copies/mL  11/26/2017 <20 NOT DETECTED copies/mL  05/23/2017 <20 Copies/mL (H)   CD4 T Cell Abs (/uL)  Date Value  06/12/2019 725  11/26/2017 980  05/23/2017 980     Problem List Items Addressed This Visit       High   HIV disease ([place])    His infection has been under excellent, long-term control.  He will get repeat lab work today, continue [place] and follow-up in 1 year.  He is not at risk for monkey pox.  We gave him a COVID booster vaccine today.      Relevant Medications   bictegravir-emtricitabine-tenofovir AF (BIKTARVY) 50-200-25 MG TABS tablet   Other Relevant Orders   T-helper cell (CD4)- ([facility] clinic only)   HIV-1 RNA quant-no reflex-bld   RPR   Comprehensive metabolic panel   CBC     Unprioritized   Depression    His depression is in remission.      Cigarette smoker    I encouraged him to make every effort to quit again.         Nicholas Bickers, MD [facility] [place] [facility] [phone] pager   [phone] cell 10/14/2020, 11:38 AM

## 2020-10-18 LAB — CBC
HCT: 52.6 % — ABNORMAL HIGH (ref 38.5–50.0)
Hemoglobin: 17.5 g/dL — ABNORMAL HIGH (ref 13.2–17.1)
MCH: 33.6 pg — ABNORMAL HIGH (ref 27.0–33.0)
MCHC: 33.3 g/dL (ref 32.0–36.0)
MCV: 101 fL — ABNORMAL HIGH (ref 80.0–100.0)
MPV: 10.3 fL (ref 7.5–12.5)
Platelets: 173 10*3/uL (ref 140–400)
RBC: 5.21 10*6/uL (ref 4.20–5.80)
RDW: 14.4 % (ref 11.0–15.0)
WBC: 8.1 10*3/uL (ref 3.8–10.8)

## 2020-10-18 LAB — COMPREHENSIVE METABOLIC PANEL
AG Ratio: 1.5 (calc) (ref 1.0–2.5)
ALT: 35 U/L (ref 9–46)
AST: 35 U/L (ref 10–35)
Albumin: 4.1 g/dL (ref 3.6–5.1)
Alkaline phosphatase (APISO): 74 U/L (ref 35–144)
BUN: 21 mg/dL (ref 7–25)
CO2: 25 mmol/L (ref 20–32)
Calcium: 9.3 mg/dL (ref 8.6–10.3)
Chloride: 106 mmol/L (ref 98–110)
Creat: 1.27 mg/dL (ref 0.70–1.28)
Globulin: 2.7 g/dL (calc) (ref 1.9–3.7)
Glucose, Bld: 106 mg/dL — ABNORMAL HIGH (ref 65–99)
Potassium: 4.4 mmol/L (ref 3.5–5.3)
Sodium: 140 mmol/L (ref 135–146)
Total Bilirubin: 0.7 mg/dL (ref 0.2–1.2)
Total Protein: 6.8 g/dL (ref 6.1–8.1)

## 2020-10-18 LAB — T-HELPER CELL (CD4) - (RCID CLINIC ONLY)
CD4 % Helper T Cell: 39 % (ref 33–65)
CD4 T Cell Abs: 775 /uL (ref 400–1790)

## 2020-10-18 LAB — FLUORESCENT TREPONEMAL AB(FTA)-IGG-BLD: Fluorescent Treponemal ABS: REACTIVE — AB

## 2020-10-18 LAB — HIV-1 RNA QUANT-NO REFLEX-BLD
HIV 1 RNA Quant: NOT DETECTED Copies/mL
HIV-1 RNA Quant, Log: NOT DETECTED Log cps/mL

## 2020-10-18 LAB — RPR TITER: RPR Titer: 1:1 {titer} — ABNORMAL HIGH

## 2020-10-18 LAB — RPR: RPR Ser Ql: REACTIVE — AB

## 2020-10-19 ENCOUNTER — Encounter: Payer: Self-pay | Admitting: Internal Medicine

## 2020-10-19 ENCOUNTER — Other Ambulatory Visit: Payer: Self-pay | Admitting: Internal Medicine

## 2020-10-19 ENCOUNTER — Telehealth: Payer: Self-pay | Admitting: Internal Medicine

## 2020-10-19 DIAGNOSIS — A528 Late syphilis, latent: Secondary | ICD-10-CM

## 2020-10-19 MED ORDER — DOXYCYCLINE HYCLATE 100 MG PO TABS: 100.0000 mg | ORAL_TABLET | Freq: Two times a day (BID) | ORAL | 0 refills | Status: DC

## 2020-10-19 NOTE — Telephone Encounter (Signed)
Spoke with patient regarding provider's message. Patient is surprised by the news, but is in agreement to starting treatment for latent syphillis. Does not have any questions at this time.  Nicholas [name], RMA

## 2020-10-19 NOTE — Telephone Encounter (Signed)
Nicholas [name]'s recent RPR was newly positive at 1:1 and confirmed by a positive treponemal antibody.  He has no known history of syphilis and says that he has not been sexually active in over 7 years.  He is penicillin allergic.  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

## 2020-11-02 DIAGNOSIS — J449 Chronic obstructive pulmonary disease, unspecified: Secondary | ICD-10-CM | POA: Diagnosis not present

## 2020-11-02 DIAGNOSIS — B20 Human immunodeficiency virus [HIV] disease: Secondary | ICD-10-CM | POA: Diagnosis not present

## 2020-11-02 DIAGNOSIS — Z6829 Body mass index (BMI) 29.0-29.9, adult: Secondary | ICD-10-CM | POA: Diagnosis not present

## 2020-11-02 DIAGNOSIS — R972 Elevated prostate specific antigen [PSA]: Secondary | ICD-10-CM | POA: Diagnosis not present

## 2020-11-02 DIAGNOSIS — F17200 Nicotine dependence, unspecified, uncomplicated: Secondary | ICD-10-CM | POA: Diagnosis not present

## 2020-11-02 DIAGNOSIS — I2510 Atherosclerotic heart disease of native coronary artery without angina pectoris: Secondary | ICD-10-CM | POA: Diagnosis not present

## 2020-11-02 DIAGNOSIS — Z6828 Body mass index (BMI) 28.0-28.9, adult: Secondary | ICD-10-CM | POA: Diagnosis not present

## 2020-11-02 DIAGNOSIS — I10 Essential (primary) hypertension: Secondary | ICD-10-CM | POA: Diagnosis not present

## 2020-11-02 DIAGNOSIS — Z6830 Body mass index (BMI) 30.0-30.9, adult: Secondary | ICD-10-CM | POA: Diagnosis not present

## 2020-11-02 DIAGNOSIS — E559 Vitamin D deficiency, unspecified: Secondary | ICD-10-CM | POA: Diagnosis not present

## 2020-11-10 DIAGNOSIS — Z720 Tobacco use: Secondary | ICD-10-CM | POA: Diagnosis not present

## 2020-11-10 DIAGNOSIS — N189 Chronic kidney disease, unspecified: Secondary | ICD-10-CM | POA: Diagnosis not present

## 2020-11-10 DIAGNOSIS — D751 Secondary polycythemia: Secondary | ICD-10-CM | POA: Diagnosis not present

## 2020-11-10 DIAGNOSIS — Z87891 Personal history of nicotine dependence: Secondary | ICD-10-CM | POA: Diagnosis not present

## 2020-11-10 DIAGNOSIS — Z1329 Encounter for screening for other suspected endocrine disorder: Secondary | ICD-10-CM | POA: Diagnosis not present

## 2020-11-10 DIAGNOSIS — I10 Essential (primary) hypertension: Secondary | ICD-10-CM | POA: Diagnosis not present

## 2020-11-15 DIAGNOSIS — M5416 Radiculopathy, lumbar region: Secondary | ICD-10-CM | POA: Diagnosis not present

## 2020-11-15 DIAGNOSIS — Z6827 Body mass index (BMI) 27.0-27.9, adult: Secondary | ICD-10-CM | POA: Diagnosis not present

## 2020-11-15 DIAGNOSIS — I10 Essential (primary) hypertension: Secondary | ICD-10-CM | POA: Diagnosis not present

## 2020-12-06 ENCOUNTER — Ambulatory Visit: Payer: Medicare HMO | Admitting: "Endocrinology

## 2021-02-07 DIAGNOSIS — J449 Chronic obstructive pulmonary disease, unspecified: Secondary | ICD-10-CM | POA: Diagnosis not present

## 2021-02-07 DIAGNOSIS — R609 Edema, unspecified: Secondary | ICD-10-CM | POA: Diagnosis not present

## 2021-02-07 DIAGNOSIS — I2510 Atherosclerotic heart disease of native coronary artery without angina pectoris: Secondary | ICD-10-CM | POA: Diagnosis not present

## 2021-02-07 DIAGNOSIS — E559 Vitamin D deficiency, unspecified: Secondary | ICD-10-CM | POA: Diagnosis not present

## 2021-02-07 DIAGNOSIS — R972 Elevated prostate specific antigen [PSA]: Secondary | ICD-10-CM | POA: Diagnosis not present

## 2021-02-07 DIAGNOSIS — Z6828 Body mass index (BMI) 28.0-28.9, adult: Secondary | ICD-10-CM | POA: Diagnosis not present

## 2021-02-07 DIAGNOSIS — I10 Essential (primary) hypertension: Secondary | ICD-10-CM | POA: Diagnosis not present

## 2021-02-07 DIAGNOSIS — Z23 Encounter for immunization: Secondary | ICD-10-CM | POA: Diagnosis not present

## 2021-02-07 DIAGNOSIS — F17200 Nicotine dependence, unspecified, uncomplicated: Secondary | ICD-10-CM | POA: Diagnosis not present

## 2021-02-07 DIAGNOSIS — B20 Human immunodeficiency virus [HIV] disease: Secondary | ICD-10-CM | POA: Diagnosis not present

## 2021-03-11 DIAGNOSIS — J431 Panlobular emphysema: Secondary | ICD-10-CM | POA: Diagnosis not present

## 2021-03-11 DIAGNOSIS — L111 Transient acantholytic dermatosis [Grover]: Secondary | ICD-10-CM | POA: Diagnosis not present

## 2021-03-11 DIAGNOSIS — D751 Secondary polycythemia: Secondary | ICD-10-CM | POA: Diagnosis not present

## 2021-03-11 DIAGNOSIS — I739 Peripheral vascular disease, unspecified: Secondary | ICD-10-CM | POA: Diagnosis not present

## 2021-03-11 DIAGNOSIS — B20 Human immunodeficiency virus [HIV] disease: Secondary | ICD-10-CM | POA: Diagnosis not present

## 2021-04-05 ENCOUNTER — Ambulatory Visit: Payer: Medicare HMO | Admitting: Cardiology

## 2021-05-10 DIAGNOSIS — J449 Chronic obstructive pulmonary disease, unspecified: Secondary | ICD-10-CM | POA: Diagnosis not present

## 2021-05-10 DIAGNOSIS — B20 Human immunodeficiency virus [HIV] disease: Secondary | ICD-10-CM | POA: Diagnosis not present

## 2021-05-10 DIAGNOSIS — F17200 Nicotine dependence, unspecified, uncomplicated: Secondary | ICD-10-CM | POA: Diagnosis not present

## 2021-05-10 DIAGNOSIS — E559 Vitamin D deficiency, unspecified: Secondary | ICD-10-CM | POA: Diagnosis not present

## 2021-05-10 DIAGNOSIS — R55 Syncope and collapse: Secondary | ICD-10-CM | POA: Diagnosis not present

## 2021-05-10 DIAGNOSIS — I2510 Atherosclerotic heart disease of native coronary artery without angina pectoris: Secondary | ICD-10-CM | POA: Diagnosis not present

## 2021-05-10 DIAGNOSIS — Z6828 Body mass index (BMI) 28.0-28.9, adult: Secondary | ICD-10-CM | POA: Diagnosis not present

## 2021-05-10 DIAGNOSIS — R609 Edema, unspecified: Secondary | ICD-10-CM | POA: Diagnosis not present

## 2021-05-10 DIAGNOSIS — I10 Essential (primary) hypertension: Secondary | ICD-10-CM | POA: Diagnosis not present

## 2021-05-10 DIAGNOSIS — R972 Elevated prostate specific antigen [PSA]: Secondary | ICD-10-CM | POA: Diagnosis not present

## 2021-05-11 LAB — TSH: TSH: 0.34 — AB (ref 0.41–5.90)

## 2021-05-18 DIAGNOSIS — R55 Syncope and collapse: Secondary | ICD-10-CM | POA: Diagnosis not present

## 2021-05-18 DIAGNOSIS — I517 Cardiomegaly: Secondary | ICD-10-CM | POA: Diagnosis not present

## 2021-05-19 ENCOUNTER — Other Ambulatory Visit: Payer: Self-pay | Admitting: *Deleted

## 2021-05-19 MED ORDER — HYDROCHLOROTHIAZIDE 25 MG PO TABS: 25.0000 mg | ORAL_TABLET | Freq: Every day | ORAL | 0 refills | Status: DC

## 2021-05-20 ENCOUNTER — Encounter: Payer: Self-pay | Admitting: "Endocrinology

## 2021-05-20 ENCOUNTER — Ambulatory Visit: Payer: Medicare HMO | Admitting: "Endocrinology

## 2021-05-20 VITALS — BP 116/62 | HR 68 | Ht 70.5 in | Wt 191.2 lb

## 2021-05-20 DIAGNOSIS — R7989 Other specified abnormal findings of blood chemistry: Secondary | ICD-10-CM | POA: Diagnosis not present

## 2021-05-20 DIAGNOSIS — E0590 Thyrotoxicosis, unspecified without thyrotoxic crisis or storm: Secondary | ICD-10-CM | POA: Diagnosis not present

## 2021-05-20 NOTE — Progress Notes (Signed)
? ?    Endocrinology Consult Note ?                                           05/20/2021, 1:54 PM ? ? ?Subjective:  ? ? Patient ID: Nicholas [name], male    DOB: [birth year], PCP [place] Nation, MD ? ? ?Past Medical History:  ?Diagnosis Date  ? CAD (coronary artery disease)   ? Overlapping DES x2 to the mid to distal circumflex extending into OM 3 01/14/2018, DES x2 to the mid to distal RCA 01/16/2018  ? COPD (chronic obstructive pulmonary disease) ([place])   ? Essential hypertension   ? Grover's disease 2017  ? HIV (human immunodeficiency virus infection) ([place])   ? Polycythemia 2017  ? Seasonal allergies 2017  ? ST elevation myocardial infarction (STEMI) of inferior wall ([place])   ? November 2019  ? Stroke (cerebrum) ([place])   ? Vitamin D deficiency   ? ?Past Surgical History:  ?Procedure Laterality Date  ? ABDOMINAL AORTOGRAM W/LOWER EXTREMITY Bilateral 12/02/2018  ? Procedure: ABDOMINAL AORTOGRAM W/LOWER EXTREMITY;  Surgeon: Lorretta Harp, MD;  Location: [facility];  Service: Cardiovascular;  Laterality: Bilateral;  ? CORONARY STENT INTERVENTION N/A 01/16/2018  ? Procedure: CORONARY STENT INTERVENTION;  Surgeon: Troy Sine, MD;  Location: [place] [facility];  Service: Cardiovascular;  Laterality: N/A;  ? CORONARY/GRAFT ACUTE MI REVASCULARIZATION N/A 01/14/2018  ? Procedure: Coronary/Graft Acute MI Revascularization;  Surgeon: Nelva Bush, MD;  Location: [facility];  Service: Cardiovascular;  Laterality: N/A;  ? LEFT HEART CATH N/A 01/16/2018  ? Procedure: Left Heart Cath;  Surgeon: Troy Sine, MD;  Location: [facility];  Service: Cardiovascular;  Laterality: N/A;  ? LEFT HEART CATH AND CORONARY ANGIOGRAPHY N/A 01/14/2018  ? Procedure: LEFT HEART CATH AND CORONARY ANGIOGRAPHY;  Surgeon: Nelva Bush, MD;  Location: [facility];  Service: Cardiovascular;  Laterality: N/A;  ? PERIPHERAL VASCULAR ATHERECTOMY  12/02/2018  ? Procedure: PERIPHERAL VASCULAR ATHERECTOMY;   Surgeon: Lorretta Harp, MD;  Location: [place] [facility];  Service: Cardiovascular;;  Left SFA  ? PERIPHERAL VASCULAR BALLOON ANGIOPLASTY  12/02/2018  ? Procedure: PERIPHERAL VASCULAR BALLOON ANGIOPLASTY;  Surgeon: Lorretta Harp, MD;  Location: [facility];  Service: Cardiovascular;;  Left SFA  ? ULTRASOUND GUIDANCE FOR VASCULAR ACCESS  01/16/2018  ? Procedure: Ultrasound Guidance For Vascular Access;  Surgeon: Troy Sine, MD;  Location: [facility];  Service: Cardiovascular;;  ? ?Social History  ? ?Socioeconomic History  ? Marital status: Single  ?  Spouse name: Not on file  ? Number of children: Not on file  ? Years of education: Not on file  ? Highest education level: Not on file  ?Occupational History  ? Not on file  ?Tobacco Use  ? Smoking status: Some Days  ?  Packs/day: 1.00  ?  Types: Cigarettes  ?  Start date: 04/30/1964  ? Smokeless tobacco: Never  ? Tobacco comments:  ?  not smoked since Thanksgiving  ?Vaping Use  ? Vaping Use: Never used  ?Substance and Sexual Activity  ? Alcohol use: Not Currently  ? Drug use: Not Currently  ?  Types: Marijuana  ? Sexual activity: Not Currently  ?  Comment: declined condoms  ?Other Topics Concern  ? Not on file  ?Social History Narrative  ?  Not on file  ? ?Social Determinants of Health  ? ?Financial Resource Strain: Not on file  ?Food Insecurity: Not on file  ?Transportation Needs: Not on file  ?Physical Activity: Not on file  ?Stress: Not on file  ?Social Connections: Not on file  ? ?Family History  ?Problem Relation Age of Onset  ? Heart disease Mother   ? ?Outpatient Encounter Medications as of 05/20/2021  ?Medication Sig  ? amLODipine (NORVASC) 5 MG tablet Take 1 tablet (5 mg total) by mouth daily.  ? aspirin EC 81 MG tablet Take 1 tablet (81 mg total) by mouth daily.  ? atorvastatin (LIPITOR) 20 MG tablet Take 1 tablet (20 mg total) by mouth daily.  ? baclofen (LIORESAL) 10 MG tablet Take 10 mg by mouth daily.  ?  bictegravir-emtricitabine-tenofovir AF (BIKTARVY) 50-200-25 MG TABS tablet Take 1 tablet by mouth daily.  ? carvedilol (COREG) 25 MG tablet Take 1 tablet (25 mg total) by mouth 2 (two) times daily.  ? Cholecalciferol (VITAMIN D3) 25 MCG (1000 UT) CAPS Take 1-2 capsules by mouth daily.  ? clopidogrel (PLAVIX) 75 MG tablet Take 1 tablet (75 mg total) by mouth daily.  ? hydrochlorothiazide (HYDRODIURIL) 25 MG tablet Take 1 tablet (25 mg total) by mouth daily.  ? losartan (COZAAR) 25 MG tablet Take 25 mg by mouth daily.  ? nicotine (NICODERM CQ - DOSED IN MG/24 HOURS) 21 mg/24hr patch Place onto the skin.  ? nicotine polacrilex (COMMIT) 4 MG lozenge Place inside cheek.  ? oxyCODONE-acetaminophen (PERCOCET/ROXICET) 5-325 MG tablet Take 1 tablet by mouth as needed for severe pain (back pain). (Patient not taking: Reported on 05/20/2021)  ? STIOLTO RESPIMAT 2.5-2.5 MCG/ACT AERS Inhale 1 puff into the lungs daily.  ? VENTOLIN HFA 108 (90 Base) MCG/ACT inhaler Take 1-2 puffs by mouth every 6 (six) hours as needed for shortness of breath or wheezing.  ? [DISCONTINUED] doxycycline (VIBRA-TABS) 100 MG tablet Take 1 tablet (100 mg total) by mouth 2 (two) times daily.  ? [DISCONTINUED] ergocalciferol (VITAMIN D2) 1.25 MG (50000 UT) capsule Take 1 capsule by mouth once a week.  ? ?No facility-administered encounter medications on file as of 05/20/2021.  ? ?ALLERGIES: ?Allergies  ?Allergen Reactions  ? Penicillins Anaphylaxis  ?  High fever  ?Did it involve swelling of the face/tongue/throat, SOB, or low BP? No ?Did it involve sudden or severe rash/hives, skin peeling, or any reaction on the inside of your mouth or nose? No ?Did you need to seek medical attention at a hospital or doctor's office? Yes ?When did it last happen?      65 + years ?If all above answers are "NO", may proceed with cephalosporin use. ? ?  ? ? ?VACCINATION STATUS: ?Immunization History  ?Administered Date(s) Administered  ? Fluad Quad(high Dose 65+) 12/03/2018   ? Influenza, High Dose Seasonal PF 12/19/2016, 11/14/2017  ? Moderna Sars-Covid-2 Vaccination 04/24/2019, 05/19/2019  ? PFIZER Comirnaty(Gray Top)Covid-19 Tri-Sucrose Vaccine 10/14/2020  ? Pneumococcal Polysaccharide-23 02/20/2013, 12/03/2018  ? ? ?HPI ?Nicholas [name] is 71 y.o. male who presents today with a medical history as above. he is being seen in consultation abnormal thyroid function test with suppressed TSH requested by Alda Nation, MD.  ?History is obtained directly from the patient as well as chart review.  He denies any prior history of thyroid dysfunction.  Routine lab on May 10, 2021 showed suppressed TSH of 0.34.  No associated measurement of free T4 or T3.  He is not on any antithyroid  intervention or thyroid hormone supplement.  He denies palpitations, tremor, nor voice change.  He has unintended weight loss of 10 pounds over the last year.  He denies dysphagia, shortness of breath, nor voice change.  He is a smoker currently half a pack per day.  He is a social alcohol drinker.  He has multiple medical problems including hypertension , hyperlipidemia, carotid disease, stroke, CKD.  He also has HIV disease on treatment. ? ? ? ?Review of Systems ? ?Constitutional: + Recently fluctuating body weight,  + fatigue, no subjective hyperthermia, no subjective hypothermia ?Eyes: no blurry vision, no xerophthalmia ?ENT: no sore throat, no nodules palpated in throat, no dysphagia/odynophagia, no hoarseness ?Cardiovascular: no Chest Pain, no Shortness of Breath, no palpitations, no leg swelling ?Respiratory: no cough, no shortness of breath ?Gastrointestinal: no Nausea/Vomiting/Diarhhea ?Musculoskeletal: no muscle/joint aches ?Skin: no rashes ?Neurological: no tremors, no numbness, no tingling, no dizziness ?Psychiatric: no depression, no anxiety ? ?Objective:  ?  ? ?  05/20/2021  ?  9:51 AM 10/14/2020  ? 11:11 AM 06/10/2020  ? 10:36 AM  ?Vitals with BMI  ?Height 5' 10.5"  5' 10.5"  ?Weight 191 lbs 3 oz  190 lbs 201 lbs 10 oz  ?BMI 27.04  28.51  ?Systolic 976  734  ?Diastolic 62  72  ?Pulse 68 75 80  ? ? ?BP 116/62   Pulse 68   Ht 5' 10.5" (1.791 m)   Wt 191 lb 3.2 oz (86.7 kg)   BMI 27.05 kg/m?   ?Wt Readings from Last 3 En

## 2021-05-22 LAB — T3, FREE: T3, Free: 3.1 pg/mL (ref 2.0–4.4)

## 2021-05-22 LAB — THYROID PEROXIDASE ANTIBODY: Thyroperoxidase Ab SerPl-aCnc: 71 IU/mL — ABNORMAL HIGH (ref 0–34)

## 2021-05-22 LAB — THYROGLOBULIN ANTIBODY: Thyroglobulin Antibody: <1 IU/mL (ref 0.0–0.9)

## 2021-05-22 LAB — T4, FREE: Free T4: 1.7 ng/dL (ref 0.82–1.77)

## 2021-05-22 LAB — TSH: TSH: 0.379 u[IU]/mL — ABNORMAL LOW (ref 0.450–4.500)

## 2021-05-27 ENCOUNTER — Ambulatory Visit: Payer: Medicare HMO | Admitting: "Endocrinology

## 2021-05-27 ENCOUNTER — Encounter: Payer: Self-pay | Admitting: "Endocrinology

## 2021-05-27 VITALS — BP 130/66 | HR 76 | Ht 70.5 in | Wt 195.8 lb

## 2021-05-27 DIAGNOSIS — E0590 Thyrotoxicosis, unspecified without thyrotoxic crisis or storm: Secondary | ICD-10-CM | POA: Diagnosis not present

## 2021-05-27 NOTE — Progress Notes (Signed)
? ?    ?                                           05/27/2021, 2:08 PM ? ?Endocrinology follow-up note ? ? ?Subjective:  ? ? Patient ID: Nicholas [name], male    DOB: Jun 30, 1950, PCP [place] Nation, MD ? ? ?Past Medical History:  ?Diagnosis Date  ? CAD (coronary artery disease)   ? Overlapping DES x2 to the mid to distal circumflex extending into OM 3 01/14/2018, DES x2 to the mid to distal RCA 01/16/2018  ? COPD (chronic obstructive pulmonary disease) ([place])   ? Essential hypertension   ? Grover's disease 2017  ? HIV (human immunodeficiency virus infection) ([place])   ? Polycythemia 2017  ? Seasonal allergies 2017  ? ST elevation myocardial infarction (STEMI) of inferior wall ([place])   ? November 2019  ? Stroke (cerebrum) ([place])   ? Vitamin D deficiency   ? ?Past Surgical History:  ?Procedure Laterality Date  ? ABDOMINAL AORTOGRAM W/LOWER EXTREMITY Bilateral 12/02/2018  ? Procedure: ABDOMINAL AORTOGRAM W/LOWER EXTREMITY;  Surgeon: Lorretta Harp, MD;  Location: [facility];  Service: Cardiovascular;  Laterality: Bilateral;  ? CORONARY STENT INTERVENTION N/A 01/16/2018  ? Procedure: CORONARY STENT INTERVENTION;  Surgeon: Troy Sine, MD;  Location: [facility];  Service: Cardiovascular;  Laterality: N/A;  ? CORONARY/GRAFT ACUTE MI REVASCULARIZATION N/A 01/14/2018  ? Procedure: Coronary/Graft Acute MI Revascularization;  Surgeon: Nelva Bush, MD;  Location: [facility];  Service: Cardiovascular;  Laterality: N/A;  ? LEFT HEART CATH N/A 01/16/2018  ? Procedure: Left Heart Cath;  Surgeon: Troy Sine, MD;  Location: [facility];  Service: Cardiovascular;  Laterality: N/A;  ? LEFT HEART CATH AND CORONARY ANGIOGRAPHY N/A 01/14/2018  ? Procedure: LEFT HEART CATH AND CORONARY ANGIOGRAPHY;  Surgeon: Nelva Bush, MD;  Location: [facility];  Service: Cardiovascular;  Laterality: N/A;  ? PERIPHERAL VASCULAR ATHERECTOMY  12/02/2018  ? Procedure: PERIPHERAL VASCULAR  ATHERECTOMY;  Surgeon: Lorretta Harp, MD;  Location: [facility];  Service: Cardiovascular;;  Left SFA  ? PERIPHERAL VASCULAR BALLOON ANGIOPLASTY  12/02/2018  ? Procedure: PERIPHERAL VASCULAR BALLOON ANGIOPLASTY;  Surgeon: Lorretta Harp, MD;  Location: [facility];  Service: Cardiovascular;;  Left SFA  ? ULTRASOUND GUIDANCE FOR VASCULAR ACCESS  01/16/2018  ? Procedure: Ultrasound Guidance For Vascular Access;  Surgeon: Troy Sine, MD;  Location: [facility];  Service: Cardiovascular;;  ? ?Social History  ? ?Socioeconomic History  ? Marital status: Single  ?  Spouse name: Not on file  ? Number of children: Not on file  ? Years of education: Not on file  ? Highest education level: Not on file  ?Occupational History  ? Not on file  ?Tobacco Use  ? Smoking status: Some Days  ?  Packs/day: 1.00  ?  Types: Cigarettes  ?  Start date: 04/30/1964  ? Smokeless tobacco: Never  ? Tobacco comments:  ?  not smoked since Thanksgiving  ?Vaping Use  ? Vaping Use: Never used  ?Substance and Sexual Activity  ? Alcohol use: Not Currently  ? Drug use: Not Currently  ?  Types: Marijuana  ? Sexual activity: Not Currently  ?  Comment: declined condoms  ?Other Topics Concern  ? Not on file  ?Social History Narrative  ?  Not on file  ? ?Social Determinants of Health  ? ?Financial Resource Strain: Not on file  ?Food Insecurity: Not on file  ?Transportation Needs: Not on file  ?Physical Activity: Not on file  ?Stress: Not on file  ?Social Connections: Not on file  ? ?Family History  ?Problem Relation Age of Onset  ? Heart disease Mother   ? ?Outpatient Encounter Medications as of 05/27/2021  ?Medication Sig  ? amLODipine (NORVASC) 5 MG tablet Take 1 tablet (5 mg total) by mouth daily.  ? aspirin EC 81 MG tablet Take 1 tablet (81 mg total) by mouth daily.  ? atorvastatin (LIPITOR) 20 MG tablet Take 1 tablet (20 mg total) by mouth daily.  ? baclofen (LIORESAL) 10 MG tablet Take 10 mg by mouth daily.  ?  bictegravir-emtricitabine-tenofovir AF (BIKTARVY) 50-200-25 MG TABS tablet Take 1 tablet by mouth daily.  ? carvedilol (COREG) 25 MG tablet Take 1 tablet (25 mg total) by mouth 2 (two) times daily.  ? Cholecalciferol (VITAMIN D3) 25 MCG (1000 UT) CAPS Take 1-2 capsules by mouth daily.  ? clopidogrel (PLAVIX) 75 MG tablet Take 1 tablet (75 mg total) by mouth daily.  ? hydrochlorothiazide (HYDRODIURIL) 25 MG tablet Take 1 tablet (25 mg total) by mouth daily.  ? losartan (COZAAR) 25 MG tablet Take 25 mg by mouth daily.  ? nicotine (NICODERM CQ - DOSED IN MG/24 HOURS) 21 mg/24hr patch Place onto the skin.  ? nicotine polacrilex (COMMIT) 4 MG lozenge Place inside cheek.  ? STIOLTO RESPIMAT 2.5-2.5 MCG/ACT AERS Inhale 1 puff into the lungs daily.  ? VENTOLIN HFA 108 (90 Base) MCG/ACT inhaler Take 1-2 puffs by mouth every 6 (six) hours as needed for shortness of breath or wheezing.  ? [DISCONTINUED] oxyCODONE-acetaminophen (PERCOCET/ROXICET) 5-325 MG tablet Take 1 tablet by mouth as needed for severe pain (back pain). (Patient not taking: Reported on 05/20/2021)  ? ?No facility-administered encounter medications on file as of 05/27/2021.  ? ?ALLERGIES: ?Allergies  ?Allergen Reactions  ? Penicillins Anaphylaxis  ?  High fever  ?Did it involve swelling of the face/tongue/throat, SOB, or low BP? No ?Did it involve sudden or severe rash/hives, skin peeling, or any reaction on the inside of your mouth or nose? No ?Did you need to seek medical attention at a hospital or doctor's office? Yes ?When did it last happen?      44 + years ?If all above answers are "NO", may proceed with cephalosporin use. ? ?  ? ? ?VACCINATION STATUS: ?Immunization History  ?Administered Date(s) Administered  ? Fluad Quad(high Dose 65+) 12/03/2018  ? Influenza, High Dose Seasonal PF 12/19/2016, 11/14/2017  ? Moderna Sars-Covid-2 Vaccination 04/24/2019, 05/19/2019  ? PFIZER Comirnaty(Gray Top)Covid-19 Tri-Sucrose Vaccine 10/14/2020  ? Pneumococcal  Polysaccharide-23 02/20/2013, 12/03/2018  ? ? ?HPI ?Nicholas [name] is 71 y.o. male who presents today with a medical history as above. he is being seen in follow-up after he was seen in consultation abnormal thyroid function test with suppressed TSH requested by [place] Nation, MD.  ?See notes from previous visit.  After his last visit, he was sent for more complete thyroid function test.  This test reveals mild subclinical hyperthyroidism.  He is not specifically symptomatic of thyrotoxicosis.   ? He is not on any antithyroid intervention or thyroid hormone supplement.  He denies palpitations, tremor, nor voice change.  He has unintended weight loss of 10 pounds over the last year, however that is reversing gaining 4 pounds since last visit.  He  denies dysphagia, shortness of breath, nor voice change.  He is a smoker currently half a pack per day.  He is a social alcohol drinker.  He has multiple medical problems including hypertension , hyperlipidemia, carotid disease, stroke, CKD.  He also has HIV disease on treatment. ? ? ? ?Review of Systems ? ?Constitutional: + Recently fluctuating body weight,  + fatigue, no subjective hyperthermia, no subjective hypothermia ?Eyes: no blurry vision, no xerophthalmia ?ENT: no sore throat, no nodules palpated in throat, no dysphagia/odynophagia, no hoarseness ?Cardiovascular: no Chest Pain, no Shortness of Breath, no palpitations, no leg swelling ?Respiratory: no cough, no shortness of breath ?Gastrointestinal: no Nausea/Vomiting/Diarhhea ?Musculoskeletal: no muscle/joint aches ?Skin: no rashes ?Neurological: no tremors, no numbness, no tingling, no dizziness ?Psychiatric: no depression, no anxiety ? ?Objective:  ?  ? ?  05/27/2021  ? 10:49 AM 05/20/2021  ?  9:51 AM 10/14/2020  ? 11:11 AM  ?Vitals with BMI  ?Height 5' 10.5" 5' 10.5"   ?Weight 195 lbs 13 oz 191 lbs 3 oz 190 lbs  ?BMI 27.69 27.04   ?Systolic 235 573   ?Diastolic 66 62   ?Pulse 76 68 75  ? ? ?BP 130/66   Pulse 76    Ht 5' 10.5" (1.791 m)   Wt 195 lb 12.8 oz (88.8 kg)   BMI 27.70 kg/m?   ?Wt Readings from Last 3 Encounters:  ?05/27/21 195 lb 12.8 oz (88.8 kg)  ?05/20/21 191 lb 3.2 oz (86.7 kg)  ?10/14/20 190 lb (86.2 kg)  ?  ?Physical Exam ?

## 2021-06-13 ENCOUNTER — Other Ambulatory Visit: Payer: Self-pay | Admitting: Cardiology

## 2021-06-14 DIAGNOSIS — F17200 Nicotine dependence, unspecified, uncomplicated: Secondary | ICD-10-CM | POA: Diagnosis not present

## 2021-06-14 DIAGNOSIS — R609 Edema, unspecified: Secondary | ICD-10-CM | POA: Diagnosis not present

## 2021-06-14 DIAGNOSIS — I10 Essential (primary) hypertension: Secondary | ICD-10-CM | POA: Diagnosis not present

## 2021-06-14 DIAGNOSIS — I2510 Atherosclerotic heart disease of native coronary artery without angina pectoris: Secondary | ICD-10-CM | POA: Diagnosis not present

## 2021-06-14 DIAGNOSIS — B20 Human immunodeficiency virus [HIV] disease: Secondary | ICD-10-CM | POA: Diagnosis not present

## 2021-06-14 DIAGNOSIS — R972 Elevated prostate specific antigen [PSA]: Secondary | ICD-10-CM | POA: Diagnosis not present

## 2021-06-14 DIAGNOSIS — J449 Chronic obstructive pulmonary disease, unspecified: Secondary | ICD-10-CM | POA: Diagnosis not present

## 2021-06-14 DIAGNOSIS — E559 Vitamin D deficiency, unspecified: Secondary | ICD-10-CM | POA: Diagnosis not present

## 2021-06-21 NOTE — Progress Notes (Signed)
? ? ?Cardiology Office Note ? ?Date: 06/22/2021  ? ?ID: Nicholas [name], DOB 01-28-1951, MRN [id number] ? ?PCP:  [place] Nation, MD  ?Cardiologist:  Rozann Lesches, MD ?Electrophysiologist:  None  ? ?Chief Complaint  ?Patient presents with  ? Cardiac follow-up  ? ? ?History of Present Illness: ?Nicholas [name] is a 71 y.o. male last seen in April 2022 by Mr. Leonides Sake NP.  He is here for a routine visit.  He states that he has been more short of breath over the last 6 months.  Watery eyes, some seasonal allergy symptoms.  No fevers or productive cough.  He is following with his PCP and did undergo recent testing including an echocardiogram done at [facility] in late March that showed normal LVEF at 65 to 70% and no major valvular abnormalities. ? ?He reports compliance with his medications.  No nitroglycerin use.  He has not undergone any ischemic testing since last PCI in 2019.  I personally reviewed his ECG today which shows normal sinus rhythm. ? ?Requesting interval lab work from PCP. ? ?Past Medical History:  ?Diagnosis Date  ? CAD (coronary artery disease)   ? Overlapping DES x2 to the mid to distal circumflex extending into OM 3 01/14/2018, DES x2 to the mid to distal RCA 01/16/2018  ? COPD (chronic obstructive pulmonary disease) ([place])   ? Essential hypertension   ? Grover's disease 2017  ? HIV (human immunodeficiency virus infection) ([place])   ? Polycythemia 2017  ? Seasonal allergies 2017  ? ST elevation myocardial infarction (STEMI) of inferior wall ([place])   ? November 2019  ? Stroke (cerebrum) ([place])   ? Vitamin D deficiency   ? ? ?Past Surgical History:  ?Procedure Laterality Date  ? ABDOMINAL AORTOGRAM W/LOWER EXTREMITY Bilateral 12/02/2018  ? Procedure: ABDOMINAL AORTOGRAM W/LOWER EXTREMITY;  Surgeon: Lorretta Harp, MD;  Location: [place] [facility];  Service: Cardiovascular;  Laterality: Bilateral;  ? CORONARY STENT INTERVENTION N/A 01/16/2018  ? Procedure: CORONARY STENT INTERVENTION;  Surgeon: Troy Sine, MD;  Location: [place] [facility];  Service: Cardiovascular;  Laterality: N/A;  ? CORONARY/GRAFT ACUTE MI REVASCULARIZATION N/A 01/14/2018  ? Procedure: Coronary/Graft Acute MI Revascularization;  Surgeon: Nelva Bush, MD;  Location: [facility] [facility];  Service: Cardiovascular;  Laterality: N/A;  ? LEFT HEART CATH N/A 01/16/2018  ? Procedure: Left Heart Cath;  Surgeon: Troy Sine, MD;  Location: [place] [facility];  Service: Cardiovascular;  Laterality: N/A;  ? LEFT HEART CATH AND CORONARY ANGIOGRAPHY N/A 01/14/2018  ? Procedure: LEFT HEART CATH AND CORONARY ANGIOGRAPHY;  Surgeon: Nelva Bush, MD;  Location: [facility] [place] [facility];  Service: Cardiovascular;  Laterality: N/A;  ? PERIPHERAL VASCULAR ATHERECTOMY  12/02/2018  ? Procedure: PERIPHERAL VASCULAR ATHERECTOMY;  Surgeon: Lorretta Harp, MD;  Location: [place] [facility];  Service: Cardiovascular;;  Left SFA  ? PERIPHERAL VASCULAR BALLOON ANGIOPLASTY  12/02/2018  ? Procedure: PERIPHERAL VASCULAR BALLOON ANGIOPLASTY;  Surgeon: Lorretta Harp, MD;  Location: [place] [facility];  Service: Cardiovascular;;  Left SFA  ? ULTRASOUND GUIDANCE FOR VASCULAR ACCESS  01/16/2018  ? Procedure: Ultrasound Guidance For Vascular Access;  Surgeon: Troy Sine, MD;  Location: [facility] [facility];  Service: Cardiovascular;;  ? ? ?Current Outpatient Medications  ?Medication Sig Dispense Refill  ? amLODipine (NORVASC) 5 MG tablet Take 1 tablet (5 mg total) by mouth daily. 90 tablet 3  ? aspirin EC 81 MG tablet Take 1 tablet (81 mg total)  by mouth daily.    ? atorvastatin (LIPITOR) 20 MG tablet Take 1 tablet (20 mg total) by mouth daily. 90 tablet 3  ? baclofen (LIORESAL) 10 MG tablet Take 10 mg by mouth daily.    ? bictegravir-emtricitabine-tenofovir AF (BIKTARVY) 50-200-25 MG TABS tablet Take 1 tablet by mouth daily. 30 tablet 11  ? carvedilol (COREG) 25 MG tablet Take 1 tablet (25 mg total) by mouth 2 (two) times daily. 180 tablet 3  ?  Cholecalciferol (VITAMIN D3) 25 MCG (1000 UT) CAPS Take 1-2 capsules by mouth daily.    ? clopidogrel (PLAVIX) 75 MG tablet Take 1 tablet (75 mg total) by mouth daily. 90 tablet 3  ? hydrochlorothiazide (HYDRODIURIL) 25 MG tablet TAKE 1 TABLET(25 MG) BY MOUTH DAILY 30 tablet 3  ? losartan (COZAAR) 25 MG tablet Take 25 mg by mouth daily.    ? nicotine (NICODERM CQ - DOSED IN MG/24 HOURS) 21 mg/24hr patch Place onto the skin.    ? nicotine polacrilex (COMMIT) 4 MG lozenge Place inside cheek.    ? STIOLTO RESPIMAT 2.5-2.5 MCG/ACT AERS Inhale 1 puff into the lungs daily.    ? VENTOLIN HFA 108 (90 Base) MCG/ACT inhaler Take 1-2 puffs by mouth every 6 (six) hours as needed for shortness of breath or wheezing.  5  ? ?No current facility-administered medications for this visit.  ? ?Allergies:  Penicillins  ? ?ROS: No palpitations or syncope. ? ?Physical Exam: ?VS:  BP 116/82   Pulse 77   Ht 5' 10.5" (1.791 m)   Wt 191 lb (86.6 kg)   SpO2 91%   BMI 27.02 kg/m? , BMI Body mass index is 27.02 kg/m?. ? ?Wt Readings from Last 3 Encounters:  ?06/22/21 191 lb (86.6 kg)  ?05/27/21 195 lb 12.8 oz (88.8 kg)  ?05/20/21 191 lb 3.2 oz (86.7 kg)  ?  ?General: Patient appears comfortable at rest. ?HEENT: Conjunctiva and lids normal. ?Neck: Supple, no elevated JVP or carotid bruits, no thyromegaly. ?Lungs: Decreased breath sounds, nonlabored breathing at rest. ?Cardiac: Regular rate and rhythm, no S3 or significant systolic murmur, no pericardial rub. ?Abdomen: Soft, nontender, bowel sounds present. ?Extremities: No pitting edema, distal pulses 2+. ? ?ECG:  An ECG dated 05/24/2020 was personally reviewed today and demonstrated:  Sinus rhythm with left atrial enlargement and nonspecific ST changes. ? ?Recent Labwork: ?10/14/2020: ALT 35; AST 35; BUN 21; Creat 1.27; Hemoglobin 17.5; Platelets 173; Potassium 4.4; Sodium 140 ?05/20/2021: TSH 0.379  ?   ?Component Value Date/Time  ? CHOL 137 06/12/2019 1516  ? TRIG 94 06/12/2019 1516  ? HDL 54  06/12/2019 1516  ? CHOLHDL 2.5 06/12/2019 1516  ? VLDL 16 01/14/2018 0745  ? [place] 65 06/12/2019 1516  ? ? ?Other Studies Reviewed Today: ? ?Echocardiogram 05/18/2021 [facility]): ?Summary  ?  1. The left ventricle is normal in size with normal wall thickness.  ?  2. The left ventricular systolic function is normal, LVEF is visually  ?estimated at 65-70%.  ?  3. The left atrium is mildly dilated in size.  ?  4. The right ventricle is normal in size, with normal systolic function.  ?  5. Aortic valve sclerosis without stenosis.  ? ?Assessment and Plan: ? ?1.  CAD with history of overlapping DES to the mid and distal circumflex in 2019 followed by DES x2 to the mid and distal RCA in 2019.  No active angina but worsening shortness of breath.  ECG is normal and recent echocardiogram at  [facility] showed LVEF 65 to 70%.  We will plan a follow-up Lexiscan Myoview to reassess ischemic burden.  Continue aspirin, Plavix, Norvasc, Coreg, Cozaar, and Lipitor. ? ?2.  Mixed hyperlipidemia on Lipitor.  Requesting interval lab work from PCP.  LDL was 65 in 2021. ? ?Medication Adjustments/Labs and Tests Ordered: ?Current medicines are reviewed at length with the patient today.  Concerns regarding medicines are outlined above.  ? ?Tests Ordered: ?Orders Placed This Encounter  ?Procedures  ? NM Myocar Multi W/Spect W/Wall Motion / EF  ? EKG 12-Lead  ? ? ?Medication Changes: ?No orders of the defined types were placed in this encounter. ? ? ?Disposition:  Follow up  6 months. ? ?Signed, ?Satira Sark, MD, [facility] ?06/22/2021 10:37 AM    ?[facility] ?[place], [place], [place] [address] ?Phone: [phone]; Fax: [phone]  ?

## 2021-06-22 ENCOUNTER — Ambulatory Visit: Payer: Medicare HMO | Admitting: Cardiology

## 2021-06-22 ENCOUNTER — Encounter: Payer: Self-pay | Admitting: *Deleted

## 2021-06-22 ENCOUNTER — Encounter: Payer: Self-pay | Admitting: Cardiology

## 2021-06-22 VITALS — BP 116/82 | HR 77 | Ht 70.5 in | Wt 191.0 lb

## 2021-06-22 DIAGNOSIS — R079 Chest pain, unspecified: Secondary | ICD-10-CM

## 2021-06-22 DIAGNOSIS — E782 Mixed hyperlipidemia: Secondary | ICD-10-CM | POA: Diagnosis not present

## 2021-06-22 DIAGNOSIS — J449 Chronic obstructive pulmonary disease, unspecified: Secondary | ICD-10-CM | POA: Diagnosis not present

## 2021-06-22 DIAGNOSIS — I25119 Atherosclerotic heart disease of native coronary artery with unspecified angina pectoris: Secondary | ICD-10-CM

## 2021-06-22 NOTE — Patient Instructions (Addendum)
Medication Instructions:  Your physician recommends that you continue on your current medications as directed. Please refer to the Current Medication list given to you today.  Labwork: none  Testing/Procedures: Your physician has requested that you have a lexiscan myoview. For further information please visit [email or web address]. Please follow instruction sheet, as given.  Follow-Up: Your physician recommends that you schedule a follow-up appointment in: 6 months  Any Other Special Instructions Will Be Listed Below (If Applicable).  If you need a refill on your cardiac medications before your next appointment, please call your pharmacy. 

## 2021-06-30 ENCOUNTER — Encounter (HOSPITAL_COMMUNITY): Admission: RE | Admit: 2021-06-30 | Payer: Medicare HMO | Source: Ambulatory Visit

## 2021-06-30 ENCOUNTER — Encounter (HOSPITAL_COMMUNITY): Payer: Self-pay

## 2021-06-30 ENCOUNTER — Encounter (HOSPITAL_COMMUNITY): Payer: Medicare HMO

## 2021-07-08 ENCOUNTER — Encounter (HOSPITAL_COMMUNITY): Payer: Medicare HMO

## 2021-08-10 ENCOUNTER — Encounter: Payer: Self-pay | Admitting: Infectious Diseases

## 2021-08-29 DIAGNOSIS — J449 Chronic obstructive pulmonary disease, unspecified: Secondary | ICD-10-CM | POA: Diagnosis not present

## 2021-08-29 DIAGNOSIS — R609 Edema, unspecified: Secondary | ICD-10-CM | POA: Diagnosis not present

## 2021-08-29 DIAGNOSIS — I10 Essential (primary) hypertension: Secondary | ICD-10-CM | POA: Diagnosis not present

## 2021-08-29 DIAGNOSIS — R29898 Other symptoms and signs involving the musculoskeletal system: Secondary | ICD-10-CM | POA: Diagnosis not present

## 2021-08-29 DIAGNOSIS — F17200 Nicotine dependence, unspecified, uncomplicated: Secondary | ICD-10-CM | POA: Diagnosis not present

## 2021-08-29 DIAGNOSIS — B20 Human immunodeficiency virus [HIV] disease: Secondary | ICD-10-CM | POA: Diagnosis not present

## 2021-08-29 DIAGNOSIS — R972 Elevated prostate specific antigen [PSA]: Secondary | ICD-10-CM | POA: Diagnosis not present

## 2021-08-29 DIAGNOSIS — E559 Vitamin D deficiency, unspecified: Secondary | ICD-10-CM | POA: Diagnosis not present

## 2021-08-29 DIAGNOSIS — I2510 Atherosclerotic heart disease of native coronary artery without angina pectoris: Secondary | ICD-10-CM | POA: Diagnosis not present

## 2021-09-01 DIAGNOSIS — I739 Peripheral vascular disease, unspecified: Secondary | ICD-10-CM | POA: Diagnosis not present

## 2021-09-01 DIAGNOSIS — B20 Human immunodeficiency virus [HIV] disease: Secondary | ICD-10-CM | POA: Diagnosis not present

## 2021-09-01 DIAGNOSIS — D751 Secondary polycythemia: Secondary | ICD-10-CM | POA: Diagnosis not present

## 2021-09-01 DIAGNOSIS — R0902 Hypoxemia: Secondary | ICD-10-CM | POA: Diagnosis not present

## 2021-09-01 DIAGNOSIS — I159 Secondary hypertension, unspecified: Secondary | ICD-10-CM | POA: Diagnosis not present

## 2021-09-01 DIAGNOSIS — J431 Panlobular emphysema: Secondary | ICD-10-CM | POA: Diagnosis not present

## 2021-09-01 DIAGNOSIS — L111 Transient acantholytic dermatosis [Grover]: Secondary | ICD-10-CM | POA: Diagnosis not present

## 2021-09-01 DIAGNOSIS — Z9981 Dependence on supplemental oxygen: Secondary | ICD-10-CM | POA: Diagnosis not present

## 2021-09-08 DIAGNOSIS — I252 Old myocardial infarction: Secondary | ICD-10-CM | POA: Diagnosis not present

## 2021-09-08 DIAGNOSIS — Z720 Tobacco use: Secondary | ICD-10-CM | POA: Diagnosis not present

## 2021-09-08 DIAGNOSIS — R42 Dizziness and giddiness: Secondary | ICD-10-CM | POA: Diagnosis not present

## 2021-09-08 DIAGNOSIS — D751 Secondary polycythemia: Secondary | ICD-10-CM | POA: Diagnosis not present

## 2021-09-08 DIAGNOSIS — Z9181 History of falling: Secondary | ICD-10-CM | POA: Diagnosis not present

## 2021-09-08 DIAGNOSIS — Z716 Tobacco abuse counseling: Secondary | ICD-10-CM | POA: Diagnosis not present

## 2021-09-08 DIAGNOSIS — Z7982 Long term (current) use of aspirin: Secondary | ICD-10-CM | POA: Diagnosis not present

## 2021-09-08 DIAGNOSIS — Z7902 Long term (current) use of antithrombotics/antiplatelets: Secondary | ICD-10-CM | POA: Diagnosis not present

## 2021-09-08 DIAGNOSIS — I2510 Atherosclerotic heart disease of native coronary artery without angina pectoris: Secondary | ICD-10-CM | POA: Diagnosis not present

## 2021-10-03 ENCOUNTER — Other Ambulatory Visit: Payer: Self-pay | Admitting: Cardiology

## 2021-10-10 DIAGNOSIS — R42 Dizziness and giddiness: Secondary | ICD-10-CM | POA: Diagnosis not present

## 2021-10-10 DIAGNOSIS — I252 Old myocardial infarction: Secondary | ICD-10-CM | POA: Diagnosis not present

## 2021-10-10 DIAGNOSIS — Z9181 History of falling: Secondary | ICD-10-CM | POA: Diagnosis not present

## 2021-10-10 DIAGNOSIS — I2510 Atherosclerotic heart disease of native coronary artery without angina pectoris: Secondary | ICD-10-CM | POA: Diagnosis not present

## 2021-10-10 DIAGNOSIS — D751 Secondary polycythemia: Secondary | ICD-10-CM | POA: Diagnosis not present

## 2021-10-10 DIAGNOSIS — Z720 Tobacco use: Secondary | ICD-10-CM | POA: Diagnosis not present

## 2021-10-10 DIAGNOSIS — Z716 Tobacco abuse counseling: Secondary | ICD-10-CM | POA: Diagnosis not present

## 2021-10-14 DIAGNOSIS — D751 Secondary polycythemia: Secondary | ICD-10-CM | POA: Diagnosis not present

## 2021-10-19 ENCOUNTER — Ambulatory Visit: Payer: Medicare HMO | Admitting: Internal Medicine

## 2021-10-26 ENCOUNTER — Telehealth: Payer: Self-pay | Admitting: *Deleted

## 2021-10-26 ENCOUNTER — Other Ambulatory Visit: Payer: Self-pay | Admitting: Internal Medicine

## 2021-10-26 ENCOUNTER — Ambulatory Visit: Payer: Medicare HMO | Admitting: Internal Medicine

## 2021-10-26 ENCOUNTER — Encounter: Payer: Self-pay | Admitting: *Deleted

## 2021-10-26 DIAGNOSIS — I25708 Atherosclerosis of coronary artery bypass graft(s), unspecified, with other forms of angina pectoris: Secondary | ICD-10-CM

## 2021-10-26 DIAGNOSIS — B20 Human immunodeficiency virus [HIV] disease: Secondary | ICD-10-CM

## 2021-10-26 NOTE — Patient Outreach (Signed)
  Care Coordination   Initial Visit Note   10/26/2021 Name: Nicholas [name] MRN: [id number] DOB: 01-20-1951  Nicholas [name] is a 71 y.o. year old male who sees [place], MD for primary care. I spoke with  [name] by phone today.  What matters to the patients health and wellness today?  Complains of productive cough, nasal drainage, and intermittent shortness of breath .  State this has been going on for about a month.  He notified his a few weeks ago, received antibiotics (completed course without change in symptoms).  Also received order for home oxygen, however has not had ride to [facility] in [facility] to complete assessment and receive oxygen.  Took covid test, it was negative.  He will call PCP office to discuss follow up.   Goals Addressed               This Visit's Progress     Recover from cough/shortness of breath, and obtain oxygen (pt-stated)        Care Coordination Interventions: Provided patient with basic written and verbal COPD education on self care/management/and exacerbation prevention Provided written and verbal instructions on pursed lip breathing and utilized returned demonstration as teach back Provided instruction about proper use of medications used for management of COPD including inhalers Advised patient to self assesses COPD action plan zone and make appointment with provider if in the yellow zone for 48 hours without improvement Provided education about and advised patient to utilize infection prevention strategies to reduce risk of respiratory infection Referral made to community resources care guide team for assistance with transportation to appointments Assessed social determinant of health barriers Monitor oxygen levels daily, recording to share with provider Call PCP office to schedule appointment as antibiotics have not helped with current complaints Consider smoking cessation         SDOH assessments and interventions completed:   Yes  SDOH Interventions Today    Flowsheet Row Most Recent Value  SDOH Interventions   Food Insecurity Interventions Intervention Not Indicated  Housing Interventions Intervention Not Indicated  Transportation Interventions Other (Comment)  [Referral to [employer] care guide]  Utilities Interventions Intervention Not Indicated        Care Coordination Interventions Activated:  Yes  Care Coordination Interventions:  Yes, provided   Follow up plan: Follow up call scheduled for 9/21    Encounter Outcome:  Pt. Visit Completed   Valente David, RN, MSN [facility] Care Management Care Management Coordinator [phone]

## 2021-10-26 NOTE — Patient Instructions (Signed)
Visit Information  Thank you for taking time to visit with me today. Please don't hesitate to contact me if I can be of assistance to you before our next scheduled telephone appointment.  Following are the goals we discussed today:  Monitor oxygen levels. Call [employer] to reschedule appointment for oxygen. Consider smoking cessation. Call PCP office to schedule follow up.  Our next appointment is by telephone on 9/21  Please call the care guide team at [phone] if you need to cancel or reschedule your appointment.   Please call the Suicide and Crisis Lifeline: 988 call the [place] National Suicide Prevention Lifeline: 602 521 0994 or TTY: 985-836-0966 TTY 787 294 5492) to talk to a trained counselor call 1-800-273-TALK (toll free, 24 hour hotline) call the [facility]: [phone] if you are experiencing a [place] or [place] or need someone to talk to.  The patient verbalized understanding of instructions, educational materials, and care plan provided today and agreed to receive a mailed copy of patient instructions, educational materials, and care plan.   The patient has been provided with contact information for the care management team and has been advised to call with any health related questions or concerns.   Valente David, RN, MSN, [place] Care Management Care Management Coordinator [phone]

## 2021-10-27 ENCOUNTER — Telehealth: Payer: Self-pay

## 2021-10-27 NOTE — Telephone Encounter (Signed)
   Telephone encounter was:  Unsuccessful.  10/27/2021 Name: Nicholas [name] MRN: [id number] DOB: 1950-03-01  Unsuccessful outbound call made today to assist with:  Transportation Needs   Outreach Attempt:  1st Attempt  A HIPAA compliant voice message was left requesting a return call.  Instructed patient to call back at [phone]. Left message on voicemail for patient to return my call regarding transportation. Spoke with April at [facility] verified patient's transportation benefit he has 48 one way trips available and he will need to call 3 days in advance. [employer] transportation [phone].  [place] Resource Care Guide   [email or web address]  ?? [phone]   Website: [email or web address]  [email or web address]  "We don't say no, we SHOW how!"         The [facility] Health Department

## 2021-10-31 ENCOUNTER — Telehealth: Payer: Self-pay

## 2021-10-31 NOTE — Telephone Encounter (Signed)
   Telephone encounter was:  Unsuccessful.  10/31/2021 Name: Nicholas [name] MRN: [id number] DOB: 07/27/1950  Unsuccessful outbound call made today to assist with:  Left message on voicemail for patient to return my call regarding transportation.  Spoke with April at [facility] verified patient's transportation benefit he has 48 one way trips available and he will need to call 3 days in advance. [employer] transportation [phone].  Outreach Attempt:  2nd Attempt  A HIPAA compliant voice message was left requesting a return call.  Instructed patient to call back at [phone].  [place] Resource Care Guide   [email or web address]  ?? [phone]   Website: [email or web address]  [email or web address].com  "We don't say no, we SHOW how!"         The [facility] Health Department

## 2021-11-03 ENCOUNTER — Telehealth: Payer: Self-pay

## 2021-11-03 NOTE — Telephone Encounter (Signed)
   Telephone encounter was:  Successful.  11/03/2021 Name: Nicholas [name] MRN: [id number] DOB: 05/03/1950  Nicholas [name] is a 71 y.o. year old male who is a primary care patient of [place], MD . The community resource team was consulted for assistance with Transportation Needs   Care guide performed the following interventions: Spoke with patient, gave him the information for [employer] [phone] for medical appointments.  Informed the patient that I spoke with April at [facility] and they can transport him to [place] in [facility]. Reminded patient that he will need to call [place] 3 days in advance of appointments.  Mailed brochure for [employer] transportation. Patient has my name and number if he does not receive the brochure within the next 7-10 business days.    Follow Up Plan:  No further follow up planned at this time. The patient has been provided with needed resources.  [place] Resource Care Guide   ??[email or web address]  ?? [phone]   Website: [email or web address]  [email or web address]  "We don't say no, we SHOW how!"         The [facility] Health Department

## 2021-11-10 ENCOUNTER — Ambulatory Visit: Payer: Self-pay | Admitting: *Deleted

## 2021-11-10 NOTE — Patient Outreach (Signed)
  Care Coordination   Follow Up Visit Note   11/10/2021 Name: Nicholas [name] MRN: [id number] DOB: November 17, 1950  Nicholas [name] is a 71 y.o. year old male who sees [place], MD for primary care. I spoke with  [name] by phone today.  What matters to the patients health and wellness today?  State he has been in contact with care guide, but has not rescheduled appointment with [employer].  Monitoring oxygen level daily, today was 95% on room air.  Report his breathing is much better and he may not need oxygen any longer, but advised to still have appointment for evaluation/assessment.  Denies any urgent concerns, encouraged to contact this care manager with questions.      Goals Addressed               This Visit's Progress     COMPLETED: Recover from cough/shortness of breath, and obtain oxygen (pt-stated)   On track     Care Coordination Interventions: Provided patient with basic written and verbal COPD education on self care/management/and exacerbation prevention Provided written and verbal instructions on pursed lip breathing and utilized returned demonstration as teach back Provided instruction about proper use of medications used for management of COPD including inhalers Advised patient to self assesses COPD action plan zone and make appointment with provider if in the yellow zone for 48 hours without improvement Provided education about and advised patient to utilize infection prevention strategies to reduce risk of respiratory infection Referral made to community resources care guide team for assistance with transportation to appointments Assessed social determinant of health barriers Monitor oxygen levels daily, recording to share with provider Call PCP office to schedule appointment as antibiotics have not helped with current complaints Consider smoking cessation         SDOH assessments and interventions completed:  No     Care Coordination Interventions  Activated:  Yes  Care Coordination Interventions:  Yes, provided   Follow up plan: No further intervention required.   Encounter Outcome:  Pt. Visit Completed   Valente David, RN, MSN, [place] Care Management Care Management Coordinator [phone]

## 2021-11-10 NOTE — Patient Instructions (Signed)
Visit Information  Thank you for taking time to visit with me today. Please don't hesitate to contact me if I can be of assistance to you.  Following are the goals we discussed today:  Call to schedule appointment with [employer] for home oxygen assessment   Please call the Suicide and Crisis Lifeline: 988 call the [place] National Suicide Prevention Lifeline: (385)810-7207 or TTY: 820 502 8861 TTY 347-178-7263) to talk to a trained counselor call 1-800-273-TALK (toll free, 24 hour hotline) call the [facility]: [phone] call 911 if you are experiencing a Mental Health or [place] or need someone to talk to.  Patient verbalizes understanding of instructions and care plan provided today and agrees to view in [place]. Active MyChart status and patient understanding of how to access instructions and care plan via MyChart confirmed with patient.     The patient has been provided with contact information for the care management team and has been advised to call with any health related questions or concerns.   Valente David, RN, MSN, [place] Care Management Care Management Coordinator [phone]

## 2021-11-14 ENCOUNTER — Ambulatory Visit: Payer: Medicare HMO | Admitting: Neurology

## 2021-11-15 DIAGNOSIS — I2510 Atherosclerotic heart disease of native coronary artery without angina pectoris: Secondary | ICD-10-CM | POA: Diagnosis not present

## 2021-11-15 DIAGNOSIS — R0689 Other abnormalities of breathing: Secondary | ICD-10-CM | POA: Diagnosis not present

## 2021-11-15 DIAGNOSIS — E7800 Pure hypercholesterolemia, unspecified: Secondary | ICD-10-CM | POA: Diagnosis not present

## 2021-11-15 DIAGNOSIS — J441 Chronic obstructive pulmonary disease with (acute) exacerbation: Secondary | ICD-10-CM | POA: Diagnosis not present

## 2021-11-15 DIAGNOSIS — I252 Old myocardial infarction: Secondary | ICD-10-CM | POA: Diagnosis not present

## 2021-11-15 DIAGNOSIS — J440 Chronic obstructive pulmonary disease with acute lower respiratory infection: Secondary | ICD-10-CM | POA: Diagnosis not present

## 2021-11-15 DIAGNOSIS — N183 Chronic kidney disease, stage 3 unspecified: Secondary | ICD-10-CM | POA: Diagnosis not present

## 2021-11-15 DIAGNOSIS — M19032 Primary osteoarthritis, left wrist: Secondary | ICD-10-CM | POA: Diagnosis not present

## 2021-11-15 DIAGNOSIS — S72001A Fracture of unspecified part of neck of right femur, initial encounter for closed fracture: Secondary | ICD-10-CM | POA: Diagnosis not present

## 2021-11-15 DIAGNOSIS — I10 Essential (primary) hypertension: Secondary | ICD-10-CM | POA: Diagnosis not present

## 2021-11-15 DIAGNOSIS — Z043 Encounter for examination and observation following other accident: Secondary | ICD-10-CM | POA: Diagnosis not present

## 2021-11-15 DIAGNOSIS — W19XXXA Unspecified fall, initial encounter: Secondary | ICD-10-CM | POA: Diagnosis not present

## 2021-11-15 DIAGNOSIS — J189 Pneumonia, unspecified organism: Secondary | ICD-10-CM | POA: Diagnosis not present

## 2021-11-15 DIAGNOSIS — E785 Hyperlipidemia, unspecified: Secondary | ICD-10-CM | POA: Diagnosis not present

## 2021-11-15 DIAGNOSIS — S72091A Other fracture of head and neck of right femur, initial encounter for closed fracture: Secondary | ICD-10-CM | POA: Diagnosis not present

## 2021-11-15 DIAGNOSIS — W010XXA Fall on same level from slipping, tripping and stumbling without subsequent striking against object, initial encounter: Secondary | ICD-10-CM | POA: Diagnosis not present

## 2021-11-15 DIAGNOSIS — Z01818 Encounter for other preprocedural examination: Secondary | ICD-10-CM | POA: Diagnosis not present

## 2021-11-15 DIAGNOSIS — I739 Peripheral vascular disease, unspecified: Secondary | ICD-10-CM | POA: Diagnosis not present

## 2021-11-15 DIAGNOSIS — R0902 Hypoxemia: Secondary | ICD-10-CM | POA: Diagnosis not present

## 2021-11-15 DIAGNOSIS — F17210 Nicotine dependence, cigarettes, uncomplicated: Secondary | ICD-10-CM | POA: Diagnosis not present

## 2021-11-15 DIAGNOSIS — Z20822 Contact with and (suspected) exposure to covid-19: Secondary | ICD-10-CM | POA: Diagnosis not present

## 2021-11-15 DIAGNOSIS — J439 Emphysema, unspecified: Secondary | ICD-10-CM | POA: Diagnosis not present

## 2021-11-15 DIAGNOSIS — Z21 Asymptomatic human immunodeficiency virus [HIV] infection status: Secondary | ICD-10-CM | POA: Diagnosis not present

## 2021-11-16 ENCOUNTER — Encounter (HOSPITAL_COMMUNITY): Payer: Self-pay

## 2021-11-16 ENCOUNTER — Inpatient Hospital Stay (HOSPITAL_COMMUNITY): Payer: Medicare HMO

## 2021-11-16 ENCOUNTER — Encounter (HOSPITAL_COMMUNITY): Payer: Self-pay | Admitting: Internal Medicine

## 2021-11-16 ENCOUNTER — Inpatient Hospital Stay (HOSPITAL_COMMUNITY)
Admission: AD | Admit: 2021-11-16 | Discharge: 2021-11-21 | DRG: 521 | Disposition: A | Discharge status: Home Health | Payer: Medicare HMO | Source: Other Acute Inpatient Hospital | Attending: Internal Medicine | Admitting: Internal Medicine

## 2021-11-16 DIAGNOSIS — S72041A Displaced fracture of base of neck of right femur, initial encounter for closed fracture: Secondary | ICD-10-CM | POA: Diagnosis not present

## 2021-11-16 DIAGNOSIS — Y92002 Bathroom of unspecified non-institutional (private) residence single-family (private) house as the place of occurrence of the external cause: Secondary | ICD-10-CM | POA: Diagnosis not present

## 2021-11-16 DIAGNOSIS — Z20822 Contact with and (suspected) exposure to covid-19: Secondary | ICD-10-CM | POA: Diagnosis not present

## 2021-11-16 DIAGNOSIS — D6959 Other secondary thrombocytopenia: Secondary | ICD-10-CM | POA: Diagnosis not present

## 2021-11-16 DIAGNOSIS — I252 Old myocardial infarction: Secondary | ICD-10-CM | POA: Diagnosis not present

## 2021-11-16 DIAGNOSIS — I959 Hypotension, unspecified: Secondary | ICD-10-CM | POA: Diagnosis not present

## 2021-11-16 DIAGNOSIS — W1830XA Fall on same level, unspecified, initial encounter: Secondary | ICD-10-CM | POA: Diagnosis present

## 2021-11-16 DIAGNOSIS — D696 Thrombocytopenia, unspecified: Secondary | ICD-10-CM

## 2021-11-16 DIAGNOSIS — Z8249 Family history of ischemic heart disease and other diseases of the circulatory system: Secondary | ICD-10-CM

## 2021-11-16 DIAGNOSIS — I129 Hypertensive chronic kidney disease with stage 1 through stage 4 chronic kidney disease, or unspecified chronic kidney disease: Secondary | ICD-10-CM | POA: Diagnosis not present

## 2021-11-16 DIAGNOSIS — D751 Secondary polycythemia: Secondary | ICD-10-CM | POA: Diagnosis present

## 2021-11-16 DIAGNOSIS — J9601 Acute respiratory failure with hypoxia: Secondary | ICD-10-CM | POA: Diagnosis not present

## 2021-11-16 DIAGNOSIS — Z8673 Personal history of transient ischemic attack (TIA), and cerebral infarction without residual deficits: Secondary | ICD-10-CM | POA: Diagnosis not present

## 2021-11-16 DIAGNOSIS — E8770 Fluid overload, unspecified: Secondary | ICD-10-CM | POA: Diagnosis present

## 2021-11-16 DIAGNOSIS — Z96641 Presence of right artificial hip joint: Secondary | ICD-10-CM | POA: Diagnosis not present

## 2021-11-16 DIAGNOSIS — Z955 Presence of coronary angioplasty implant and graft: Secondary | ICD-10-CM

## 2021-11-16 DIAGNOSIS — Z471 Aftercare following joint replacement surgery: Secondary | ICD-10-CM | POA: Diagnosis not present

## 2021-11-16 DIAGNOSIS — D72829 Elevated white blood cell count, unspecified: Secondary | ICD-10-CM | POA: Diagnosis present

## 2021-11-16 DIAGNOSIS — J432 Centrilobular emphysema: Secondary | ICD-10-CM | POA: Diagnosis present

## 2021-11-16 DIAGNOSIS — N183 Chronic kidney disease, stage 3 unspecified: Secondary | ICD-10-CM | POA: Diagnosis present

## 2021-11-16 DIAGNOSIS — F1090 Alcohol use, unspecified, uncomplicated: Secondary | ICD-10-CM | POA: Diagnosis not present

## 2021-11-16 DIAGNOSIS — I2510 Atherosclerotic heart disease of native coronary artery without angina pectoris: Secondary | ICD-10-CM | POA: Diagnosis not present

## 2021-11-16 DIAGNOSIS — Z7902 Long term (current) use of antithrombotics/antiplatelets: Secondary | ICD-10-CM

## 2021-11-16 DIAGNOSIS — Z23 Encounter for immunization: Secondary | ICD-10-CM

## 2021-11-16 DIAGNOSIS — F17210 Nicotine dependence, cigarettes, uncomplicated: Secondary | ICD-10-CM | POA: Diagnosis present

## 2021-11-16 DIAGNOSIS — R0602 Shortness of breath: Secondary | ICD-10-CM | POA: Diagnosis not present

## 2021-11-16 DIAGNOSIS — S72001D Fracture of unspecified part of neck of right femur, subsequent encounter for closed fracture with routine healing: Secondary | ICD-10-CM | POA: Diagnosis not present

## 2021-11-16 DIAGNOSIS — Z7982 Long term (current) use of aspirin: Secondary | ICD-10-CM | POA: Diagnosis not present

## 2021-11-16 DIAGNOSIS — J439 Emphysema, unspecified: Secondary | ICD-10-CM | POA: Diagnosis not present

## 2021-11-16 DIAGNOSIS — S72009A Fracture of unspecified part of neck of unspecified femur, initial encounter for closed fracture: Secondary | ICD-10-CM

## 2021-11-16 DIAGNOSIS — S72001A Fracture of unspecified part of neck of right femur, initial encounter for closed fracture: Secondary | ICD-10-CM | POA: Diagnosis not present

## 2021-11-16 DIAGNOSIS — Z01818 Encounter for other preprocedural examination: Secondary | ICD-10-CM | POA: Diagnosis not present

## 2021-11-16 DIAGNOSIS — B20 Human immunodeficiency virus [HIV] disease: Secondary | ICD-10-CM | POA: Diagnosis present

## 2021-11-16 DIAGNOSIS — Z79899 Other long term (current) drug therapy: Secondary | ICD-10-CM | POA: Diagnosis not present

## 2021-11-16 DIAGNOSIS — E663 Overweight: Secondary | ICD-10-CM | POA: Diagnosis present

## 2021-11-16 DIAGNOSIS — I10 Essential (primary) hypertension: Secondary | ICD-10-CM | POA: Diagnosis not present

## 2021-11-16 DIAGNOSIS — Z7951 Long term (current) use of inhaled steroids: Secondary | ICD-10-CM | POA: Diagnosis not present

## 2021-11-16 DIAGNOSIS — J441 Chronic obstructive pulmonary disease with (acute) exacerbation: Secondary | ICD-10-CM | POA: Diagnosis not present

## 2021-11-16 DIAGNOSIS — M25551 Pain in right hip: Secondary | ICD-10-CM | POA: Diagnosis present

## 2021-11-16 DIAGNOSIS — N1831 Chronic kidney disease, stage 3a: Secondary | ICD-10-CM | POA: Diagnosis present

## 2021-11-16 DIAGNOSIS — F17200 Nicotine dependence, unspecified, uncomplicated: Secondary | ICD-10-CM | POA: Diagnosis not present

## 2021-11-16 DIAGNOSIS — Z6828 Body mass index (BMI) 28.0-28.9, adult: Secondary | ICD-10-CM | POA: Diagnosis not present

## 2021-11-16 DIAGNOSIS — J9621 Acute and chronic respiratory failure with hypoxia: Secondary | ICD-10-CM | POA: Diagnosis present

## 2021-11-16 DIAGNOSIS — L111 Transient acantholytic dermatosis [Grover]: Secondary | ICD-10-CM | POA: Diagnosis present

## 2021-11-16 DIAGNOSIS — I509 Heart failure, unspecified: Secondary | ICD-10-CM | POA: Diagnosis not present

## 2021-11-16 LAB — CBC WITH DIFFERENTIAL/PLATELET
Abs Immature Granulocytes: 0.09 10*3/uL — ABNORMAL HIGH (ref 0.00–0.07)
Basophils Absolute: 0 10*3/uL (ref 0.0–0.1)
Basophils Relative: 0 %
Eosinophils Absolute: 0 10*3/uL (ref 0.0–0.5)
Eosinophils Relative: 0 %
HCT: 45.4 % (ref 39.0–52.0)
Hemoglobin: 15.9 g/dL (ref 13.0–17.0)
Immature Granulocytes: 1 %
Lymphocytes Relative: 7 %
Lymphs Abs: 1 10*3/uL (ref 0.7–4.0)
MCH: 34.9 pg — ABNORMAL HIGH (ref 26.0–34.0)
MCHC: 35 g/dL (ref 30.0–36.0)
MCV: 99.6 fL (ref 80.0–100.0)
Monocytes Absolute: 1 10*3/uL (ref 0.1–1.0)
Monocytes Relative: 7 %
Neutro Abs: 12.4 10*3/uL — ABNORMAL HIGH (ref 1.7–7.7)
Neutrophils Relative %: 85 %
Platelets: 145 10*3/uL — ABNORMAL LOW (ref 150–400)
RBC: 4.56 MIL/uL (ref 4.22–5.81)
RDW: 12.7 % (ref 11.5–15.5)
WBC: 14.5 10*3/uL — ABNORMAL HIGH (ref 4.0–10.5)
nRBC: 0 % (ref 0.0–0.2)

## 2021-11-16 LAB — COMPREHENSIVE METABOLIC PANEL
ALT: 22 U/L (ref 0–44)
AST: 20 U/L (ref 15–41)
Albumin: 2.7 g/dL — ABNORMAL LOW (ref 3.5–5.0)
Alkaline Phosphatase: 63 U/L (ref 38–126)
Anion gap: 6 (ref 5–15)
BUN: 29 mg/dL — ABNORMAL HIGH (ref 8–23)
CO2: 27 mmol/L (ref 22–32)
Calcium: 9.1 mg/dL (ref 8.9–10.3)
Chloride: 103 mmol/L (ref 98–111)
Creatinine, Ser: 1.28 mg/dL — ABNORMAL HIGH (ref 0.61–1.24)
GFR, Estimated: 60 mL/min — ABNORMAL LOW (ref >60)
Glucose, Bld: 121 mg/dL — ABNORMAL HIGH (ref 70–99)
Potassium: 5 mmol/L (ref 3.5–5.1)
Sodium: 136 mmol/L (ref 135–145)
Total Bilirubin: 0.9 mg/dL (ref 0.3–1.2)
Total Protein: 6.3 g/dL — ABNORMAL LOW (ref 6.5–8.1)

## 2021-11-16 LAB — BASIC METABOLIC PANEL
Anion gap: 9 (ref 5–15)
BUN: 29 mg/dL — ABNORMAL HIGH (ref 8–23)
CO2: 23 mmol/L (ref 22–32)
Calcium: 8.9 mg/dL (ref 8.9–10.3)
Chloride: 104 mmol/L (ref 98–111)
Creatinine, Ser: 1.2 mg/dL (ref 0.61–1.24)
GFR, Estimated: >60 mL/min (ref >60)
Glucose, Bld: 121 mg/dL — ABNORMAL HIGH (ref 70–99)
Potassium: 4.3 mmol/L (ref 3.5–5.1)
Sodium: 136 mmol/L (ref 135–145)

## 2021-11-16 LAB — CBC
HCT: 45.4 % (ref 39.0–52.0)
Hemoglobin: 16.1 g/dL (ref 13.0–17.0)
MCH: 35.3 pg — ABNORMAL HIGH (ref 26.0–34.0)
MCHC: 35.5 g/dL (ref 30.0–36.0)
MCV: 99.6 fL (ref 80.0–100.0)
Platelets: 137 10*3/uL — ABNORMAL LOW (ref 150–400)
RBC: 4.56 MIL/uL (ref 4.22–5.81)
RDW: 12.8 % (ref 11.5–15.5)
WBC: 13.9 10*3/uL — ABNORMAL HIGH (ref 4.0–10.5)
nRBC: 0 % (ref 0.0–0.2)

## 2021-11-16 LAB — BRAIN NATRIURETIC PEPTIDE: B Natriuretic Peptide: 313.9 pg/mL — ABNORMAL HIGH (ref 0.0–100.0)

## 2021-11-16 LAB — ABO/RH: ABO/RH(D): A POS

## 2021-11-16 LAB — TYPE AND SCREEN
ABO/RH(D): A POS
Antibody Screen: NEGATIVE

## 2021-11-16 LAB — TROPONIN I (HIGH SENSITIVITY): Troponin I (High Sensitivity): 8 ng/L (ref <18)

## 2021-11-16 MED ORDER — METHYLPREDNISOLONE SODIUM SUCC 125 MG IJ SOLR
80.0000 mg | Freq: Every day | INTRAMUSCULAR | Status: DC
  Administered 2021-11-16: 80 mg via INTRAVENOUS
  Filled 2021-11-16 (×2): qty 2

## 2021-11-16 MED ORDER — METHYLPREDNISOLONE SODIUM SUCC 40 MG IJ SOLR: 40.0000 mg | Freq: Two times a day (BID) | INTRAMUSCULAR | Status: DC

## 2021-11-16 MED ORDER — BICTEGRAVIR-EMTRICITAB-TENOFOV 50-200-25 MG PO TABS
1.0000 | ORAL_TABLET | Freq: Every day | ORAL | Status: DC
  Administered 2021-11-17 – 2021-11-21 (×5): 1 via ORAL
  Filled 2021-11-16 (×5): qty 1

## 2021-11-16 MED ORDER — HYDROMORPHONE HCL 1 MG/ML IJ SOLN
0.5000 mg | INTRAMUSCULAR | Status: DC | PRN
  Administered 2021-11-17 (×3): 0.5 mg via INTRAVENOUS
  Filled 2021-11-16 (×3): qty 0.5

## 2021-11-16 MED ORDER — ATORVASTATIN CALCIUM 10 MG PO TABS
20.0000 mg | ORAL_TABLET | Freq: Every day | ORAL | Status: DC
  Administered 2021-11-17 – 2021-11-21 (×5): 20 mg via ORAL
  Filled 2021-11-16 (×6): qty 2

## 2021-11-16 MED ORDER — AMLODIPINE BESYLATE 5 MG PO TABS
5.0000 mg | ORAL_TABLET | Freq: Every day | ORAL | Status: DC
  Administered 2021-11-17 – 2021-11-20 (×4): 5 mg via ORAL
  Filled 2021-11-16 (×4): qty 1

## 2021-11-16 MED ORDER — HYDROMORPHONE HCL 1 MG/ML IJ SOLN
1.0000 mg | Freq: Once | INTRAMUSCULAR | Status: AC
  Administered 2021-11-16: 1 mg via INTRAVENOUS
  Filled 2021-11-16: qty 1

## 2021-11-16 MED ORDER — CARVEDILOL 25 MG PO TABS
25.0000 mg | ORAL_TABLET | Freq: Two times a day (BID) | ORAL | Status: DC
  Administered 2021-11-16 – 2021-11-20 (×7): 25 mg via ORAL
  Filled 2021-11-16 (×8): qty 1

## 2021-11-16 MED ORDER — BUDESONIDE 0.25 MG/2ML IN SUSP
0.2500 mg | Freq: Two times a day (BID) | RESPIRATORY_TRACT | Status: DC
  Administered 2021-11-16 – 2021-11-21 (×10): 0.25 mg via RESPIRATORY_TRACT
  Filled 2021-11-16 (×10): qty 2

## 2021-11-16 MED ORDER — LOSARTAN POTASSIUM 50 MG PO TABS
25.0000 mg | ORAL_TABLET | Freq: Every day | ORAL | Status: DC
  Administered 2021-11-17 – 2021-11-18 (×2): 25 mg via ORAL
  Filled 2021-11-16 (×2): qty 1

## 2021-11-16 MED ORDER — HYDROCODONE-ACETAMINOPHEN 5-325 MG PO TABS
1.0000 | ORAL_TABLET | Freq: Four times a day (QID) | ORAL | Status: DC | PRN
  Administered 2021-11-17 – 2021-11-18 (×3): 2 via ORAL
  Filled 2021-11-16 (×3): qty 2

## 2021-11-16 MED ORDER — IPRATROPIUM-ALBUTEROL 0.5-2.5 (3) MG/3ML IN SOLN
3.0000 mL | RESPIRATORY_TRACT | Status: DC
  Administered 2021-11-16 – 2021-11-17 (×2): 3 mL via RESPIRATORY_TRACT
  Filled 2021-11-16 (×2): qty 3

## 2021-11-16 NOTE — H&P (Signed)
History and Physical    Nicholas [name] XHB:[id number] DOB: [birth year] DOA: 11/16/2021  Patient coming from: Patient was transferred from [facility].  Chief Complaint: Fall.  HPI: Nicholas [name] is a 71 y.o. male with history of CAD status post stenting, COPD, HIV, chronic kidney disease stage III, hypertension had presented to the ER at [facility] with a fall.  Patient denies hitting his head or losing consciousness.  He states his legs gave way and he fell.  ED Course: In the ER at [facility] patient was found to have right hip fracture.  Plan was to do surgery but patient soon became hypoxic requiring 6 L high flow oxygen.  CT angiogram of the chest was negative for PE but did show some features concerning for bronchitis.  Patient also was placed on steroids antibiotics.  Patient was transferred to [facility] since patient's oxygen requirement was significantly high and requested transfer to higher center of care for further management.  Review of Systems: As per HPI, rest all negative.   Past Medical History:  Diagnosis Date   CAD (coronary artery disease)    Overlapping DES x2 to the mid to distal circumflex extending into OM 3 01/14/2018, DES x2 to the mid to distal RCA 01/16/2018   COPD (chronic obstructive pulmonary disease) ([place])    Essential hypertension    Grover's disease 2017   HIV (human immunodeficiency virus infection) ([place])    Polycythemia 2017   Seasonal allergies 2017   ST elevation myocardial infarction (STEMI) of inferior wall [facility])    November 2019   Stroke (cerebrum) [facility])    Vitamin D deficiency     Past Surgical History:  Procedure Laterality Date   ABDOMINAL AORTOGRAM W/LOWER EXTREMITY Bilateral 12/02/2018   Procedure: ABDOMINAL AORTOGRAM W/LOWER EXTREMITY;  Surgeon: Lorretta Harp, MD;  Location: [place] CV LAB;  Service: Cardiovascular;  Laterality: Bilateral;   CORONARY STENT INTERVENTION N/A 01/16/2018   Procedure: CORONARY STENT INTERVENTION;  Surgeon:  Troy Sine, MD;  Location: [place] CV LAB;  Service: Cardiovascular;  Laterality: N/A;   CORONARY/GRAFT ACUTE MI REVASCULARIZATION N/A 01/14/2018   Procedure: Coronary/Graft Acute MI Revascularization;  Surgeon: Nelva Bush, MD;  Location: [place] CV LAB;  Service: Cardiovascular;  Laterality: N/A;   LEFT HEART CATH N/A 01/16/2018   Procedure: Left Heart Cath;  Surgeon: Troy Sine, MD;  Location: [place] CV LAB;  Service: Cardiovascular;  Laterality: N/A;   LEFT HEART CATH AND CORONARY ANGIOGRAPHY N/A 01/14/2018   Procedure: LEFT HEART CATH AND CORONARY ANGIOGRAPHY;  Surgeon: Nelva Bush, MD;  Location: [place] CV LAB;  Service: Cardiovascular;  Laterality: N/A;   PERIPHERAL VASCULAR ATHERECTOMY  12/02/2018   Procedure: PERIPHERAL VASCULAR ATHERECTOMY;  Surgeon: Lorretta Harp, MD;  Location: [place] CV LAB;  Service: Cardiovascular;;  Left SFA   PERIPHERAL VASCULAR BALLOON ANGIOPLASTY  12/02/2018   Procedure: PERIPHERAL VASCULAR BALLOON ANGIOPLASTY;  Surgeon: Lorretta Harp, MD;  Location: [place] CV LAB;  Service: Cardiovascular;;  Left SFA   ULTRASOUND GUIDANCE FOR VASCULAR ACCESS  01/16/2018   Procedure: Ultrasound Guidance For Vascular Access;  Surgeon: Troy Sine, MD;  Location: [place] [facility];  Service: Cardiovascular;;     reports that he has been smoking cigarettes. He started smoking about 57 years ago. He has been smoking an average of 1 pack per day. He has never used smokeless tobacco. He reports that he does not currently use alcohol. He reports that he does not  currently use drugs after having used the following drugs: Marijuana.  Allergies  Allergen Reactions   Penicillins Anaphylaxis    High fever  Did it involve swelling of the face/tongue/throat, SOB, or low BP? No Did it involve sudden or severe rash/hives, skin peeling, or any reaction on the inside of your mouth or nose? No Did you need to seek medical attention  at a hospital or doctor's office? Yes When did it last happen?      45 + years If all above answers are "NO", may proceed with cephalosporin use.      Family History  Problem Relation Age of Onset   Heart disease Mother     Prior to Admission medications   Medication Sig Start Date End Date Taking? Authorizing Provider  amLODipine (NORVASC) 5 MG tablet Take 1 tablet (5 mg total) by mouth daily. 05/19/20   Satira Sark, MD  aspirin EC 81 MG tablet Take 1 tablet (81 mg total) by mouth daily. 01/17/18   Strader, Fransisco Hertz, PA-C  atorvastatin (LIPITOR) 20 MG tablet Take 1 tablet (20 mg total) by mouth daily. 05/24/20   Verta Ellen., NP  baclofen (LIORESAL) 10 MG tablet Take 10 mg by mouth daily.    [provider]  BIKTARVY 50-200-25 MG TABS tablet TAKE 1 TABLET BY MOUTH DAILY 10/26/21   Michel Bickers, MD  carvedilol (COREG) 25 MG tablet Take 1 tablet (25 mg total) by mouth 2 (two) times daily. 05/24/20   Verta Ellen., NP  Cholecalciferol (VITAMIN D3) 25 MCG (1000 UT) CAPS Take 1-2 capsules by mouth daily.    [provider]  clopidogrel (PLAVIX) 75 MG tablet Take 1 tablet (75 mg total) by mouth daily. 05/24/20   Verta Ellen., NP  hydrochlorothiazide (HYDRODIURIL) 25 MG tablet TAKE 1 TABLET(25 MG) BY MOUTH DAILY 10/03/21   Satira Sark, MD  losartan (COZAAR) 25 MG tablet Take 25 mg by mouth daily.    [provider]  nicotine (NICODERM CQ - DOSED IN MG/24 HOURS) 21 mg/24hr patch Place onto the skin.    [provider]  nicotine polacrilex (COMMIT) 4 MG lozenge Place inside cheek.    [provider]  STIOLTO RESPIMAT 2.5-2.5 MCG/ACT AERS Inhale 1 puff into the lungs daily. 03/25/20   [provider]  VENTOLIN HFA 108 (90 Base) MCG/ACT inhaler Take 1-2 puffs by mouth every 6 (six) hours as needed for shortness of breath or wheezing. 10/18/17   [provider]    Physical Exam: Constitutional: Moderately  built and nourished. Vitals:   11/16/21 1816  BP: (!) 144/67  Pulse: 83  Resp: 20  Temp: 98 F (36.7 C)  TempSrc: Oral  SpO2: 93%   Eyes: Anicteric no pallor. ENMT: No discharge from the ears eyes nose and mouth. Neck: No mass felt.  No neck rigidity. Respiratory: Bilateral expiratory wheeze and no crepitations. Cardiovascular: S1-S2 heard. Abdomen: Soft nontender bowel sound present. Musculoskeletal: No edema. Skin: No rash. Neurologic: Alert awake oriented to time place and person.  Moves all extremities. Psychiatric: Appears normal.  Normal affect.   Labs on Admission: I have personally reviewed following labs and imaging studies  CBC: Recent Labs  Lab 11/16/21 2025  WBC 14.5*  NEUTROABS 12.4*  HGB 15.9  HCT 45.4  MCV 99.6  PLT 062*   Basic Metabolic Panel: No results for input(s): "NA", "K", "CL", "CO2", "GLUCOSE", "BUN", "CREATININE", "CALCIUM", "MG", "PHOS" in the last 168 hours.  GFR: CrCl cannot be calculated (Patient's most recent lab result is older than the maximum 21 days allowed.). Liver Function Tests: No results for input(s): "AST", "ALT", "ALKPHOS", "BILITOT", "PROT", "ALBUMIN" in the last 168 hours. No results for input(s): "LIPASE", "AMYLASE" in the last 168 hours. No results for input(s): "AMMONIA" in the last 168 hours. Coagulation Profile: No results for input(s): "INR", "PROTIME" in the last 168 hours. Cardiac Enzymes: No results for input(s): "CKTOTAL", "CKMB", "CKMBINDEX", "TROPONINI" in the last 168 hours. BNP (last 3 results) No results for input(s): "PROBNP" in the last 8760 hours. HbA1C: No results for input(s): "HGBA1C" in the last 72 hours. CBG: No results for input(s): "GLUCAP" in the last 168 hours. Lipid Profile: No results for input(s): "CHOL", "HDL", "LDLCALC", "TRIG", "CHOLHDL", "LDLDIRECT" in the last 72 hours. Thyroid Function Tests: No results for input(s): "TSH", "T4TOTAL", "FREET4", "T3FREE", "THYROIDAB" in the last 72  hours. Anemia Panel: No results for input(s): "VITAMINB12", "FOLATE", "FERRITIN", "TIBC", "IRON", "RETICCTPCT" in the last 72 hours. Urine analysis:    Component Value Date/Time   COLORURINE YELLOW 05/23/2017 1419   APPEARANCEUR CLOUDY (A) 05/23/2017 1419   LABSPEC 1.016 05/23/2017 1419   PHURINE < OR = 5.0 05/23/2017 1419   GLUCOSEU NEGATIVE 05/23/2017 1419   HGBUR TRACE (A) 05/23/2017 1419   KETONESUR NEGATIVE 05/23/2017 1419   PROTEINUR NEGATIVE 05/23/2017 1419   NITRITE NEGATIVE 05/23/2017 1419   LEUKOCYTESUR 1+ (A) 05/23/2017 1419   Sepsis Labs: '@LABRCNTIP'$ (procalcitonin:4,lacticidven:4) )No results found for this or any previous visit (from the past 240 hour(s)).   Radiological Exams on Admission: DG CHEST PORT 1 VIEW  Result Date: 11/16/2021 CLINICAL DATA:  Shortness of breath. EXAM: PORTABLE CHEST 1 VIEW COMPARISON:  11/15/2021. FINDINGS: The heart size and mediastinal contours are stable. There is atherosclerotic calcification of the aorta. Emphysematous changes are present in the lungs. Coarse interstitial markings are noted bilaterally. Patchy airspace opacities are noted in the mid to lower lung fields bilaterally. No effusion or pneumothorax. No acute osseous abnormality. IMPRESSION: 1. Scattered airspace opacities in the mid to lower lung fields bilaterally, possible edema or infiltrate. 2. Emphysema. 3. Aortic atherosclerosis. Electronically Signed   By: Brett Fairy M.D.   On: 11/16/2021 20:39    EKG: Independently reviewed.  Normal sinus rhythm.  Assessment/Plan Principal Problem:   Hip fracture, unspecified laterality, closed, initial encounter ([place]) Active Problems:   HIV disease ([place])   CKD (chronic kidney disease) stage 3, GFR 30-59 ml/min (HCC)   Polycythemia   Grover's disease   Acute respiratory failure with hypoxia (HCC)   Hip fracture (HCC)    Right hip fracture after mechanical fall.  Did discuss with Dr. Lucia Gaskins orthopedic surgeon was planning  surgery.  Patient will be at high risk for surgery.  I did consult a pulmonologist also.  We will keep patient n.p.o. in the morning in anticipation of surgery.  Pain relief medications. Acute respiratory failure with hypoxia likely from COPD and possible pneumonia.  We will keep patient on steroids nebulizer and antibiotics.  Recheck COVID test.  Check BNP.  I ordered 1 small dose of Lasix 20 mg IV.  I have consulted pulmonary critical care. CAD status post stenting denies any chest pain. Hypertension we will continue with Coreg losartan and Norvasc.  Hold hydrochlorothiazide for now.  Follow blood pressure trends. History of polycythemia follow CBC closely. History of Grovers disease. HIV reviewed patient's infectious disease follow-up notes.  Excellent control.  Continue Biktarvy.  Check LDH to make sure  there is no PCP pneumonia given hypoxia.  Given that patient has hip fracture and also respiratory failure will need close monitoring and inpatient status.   DVT prophylaxis: SCDs.  Avoiding anticoagulation in anticipation of surgery. Code Status: Full code. Family Communication: Discussed with patient. Disposition Plan: Likely may need rehab. Consults called: Orthopedics and pulmonary critical care. Admission status: Inpatient.

## 2021-11-16 NOTE — Plan of Care (Signed)
  Problem: Education: Goal: Knowledge of General Education information will improve Description: Including pain rating scale, medication(s)/side effects and non-pharmacologic comfort measures Outcome: Progressing   Problem: Health Behavior/Discharge Planning: Goal: Ability to manage health-related needs will improve Outcome: Progressing   Problem: Clinical Measurements: Goal: Ability to maintain clinical measurements within normal limits will improve Outcome: Progressing Goal: Diagnostic test results will improve Outcome: Progressing   Problem: Nutrition: Goal: Adequate nutrition will be maintained Outcome: Progressing   Problem: Coping: Goal: Level of anxiety will decrease Outcome: Progressing   Problem: Pain Managment: Goal: General experience of comfort will improve Outcome: Progressing

## 2021-11-17 ENCOUNTER — Inpatient Hospital Stay (HOSPITAL_COMMUNITY): Payer: Medicare HMO

## 2021-11-17 ENCOUNTER — Other Ambulatory Visit: Payer: Self-pay

## 2021-11-17 DIAGNOSIS — F17210 Nicotine dependence, cigarettes, uncomplicated: Secondary | ICD-10-CM | POA: Diagnosis not present

## 2021-11-17 DIAGNOSIS — S72041A Displaced fracture of base of neck of right femur, initial encounter for closed fracture: Principal | ICD-10-CM

## 2021-11-17 DIAGNOSIS — J9601 Acute respiratory failure with hypoxia: Secondary | ICD-10-CM | POA: Diagnosis not present

## 2021-11-17 DIAGNOSIS — S72001A Fracture of unspecified part of neck of right femur, initial encounter for closed fracture: Secondary | ICD-10-CM

## 2021-11-17 DIAGNOSIS — I509 Heart failure, unspecified: Secondary | ICD-10-CM | POA: Diagnosis not present

## 2021-11-17 LAB — LACTATE DEHYDROGENASE: LDH: 261 U/L — ABNORMAL HIGH (ref 98–192)

## 2021-11-17 LAB — ECHOCARDIOGRAM COMPLETE
AR max vel: 3.09 cm2
AV Area VTI: 3.41 cm2
AV Area mean vel: 3.13 cm2
AV Mean grad: 3 mmHg
AV Peak grad: 4.9 mmHg
Ao pk vel: 1.11 m/s
Area-P 1/2: 3.37 cm2
Height: 70.5 in
S' Lateral: 3.3 cm
Weight: 3209.9 oz

## 2021-11-17 LAB — SURGICAL PCR SCREEN
MRSA, PCR: NEGATIVE
Staphylococcus aureus: NEGATIVE

## 2021-11-17 LAB — SARS CORONAVIRUS 2 BY RT PCR: SARS Coronavirus 2 by RT PCR: NEGATIVE

## 2021-11-17 MED ORDER — NICOTINE 21 MG/24HR TD PT24
21.0000 mg | MEDICATED_PATCH | Freq: Every day | TRANSDERMAL | Status: DC
  Administered 2021-11-17 – 2021-11-21 (×5): 21 mg via TRANSDERMAL
  Filled 2021-11-17 (×5): qty 1

## 2021-11-17 MED ORDER — FUROSEMIDE 10 MG/ML IJ SOLN
20.0000 mg | Freq: Once | INTRAMUSCULAR | Status: AC
  Administered 2021-11-17: 20 mg via INTRAVENOUS
  Filled 2021-11-17: qty 2

## 2021-11-17 MED ORDER — LEVOFLOXACIN IN D5W 750 MG/150ML IV SOLN
750.0000 mg | INTRAVENOUS | Status: DC
  Administered 2021-11-17: 750 mg via INTRAVENOUS
  Filled 2021-11-17: qty 150

## 2021-11-17 MED ORDER — PREDNISONE 20 MG PO TABS
40.0000 mg | ORAL_TABLET | Freq: Every day | ORAL | Status: AC
  Administered 2021-11-17 – 2021-11-20 (×4): 40 mg via ORAL
  Filled 2021-11-17 (×4): qty 2

## 2021-11-17 MED ORDER — IPRATROPIUM-ALBUTEROL 0.5-2.5 (3) MG/3ML IN SOLN
3.0000 mL | Freq: Two times a day (BID) | RESPIRATORY_TRACT | Status: DC
  Administered 2021-11-17 – 2021-11-18 (×2): 3 mL via RESPIRATORY_TRACT
  Filled 2021-11-17 (×2): qty 3

## 2021-11-17 MED ORDER — DOXYCYCLINE HYCLATE 100 MG PO TABS
100.0000 mg | ORAL_TABLET | Freq: Two times a day (BID) | ORAL | Status: AC
  Administered 2021-11-18 – 2021-11-20 (×6): 100 mg via ORAL
  Filled 2021-11-17 (×7): qty 1

## 2021-11-17 MED ORDER — ACETAMINOPHEN 325 MG PO TABS: 650.0000 mg | ORAL_TABLET | Freq: Four times a day (QID) | ORAL | Status: DC | PRN

## 2021-11-17 MED ORDER — SODIUM CHLORIDE 0.9 % IV SOLN
500.0000 mg | INTRAVENOUS | Status: DC
  Filled 2021-11-17: qty 5

## 2021-11-17 MED ORDER — SODIUM CHLORIDE 0.9 % IV SOLN: 2.0000 g | INTRAVENOUS | Status: DC

## 2021-11-17 MED ORDER — PERFLUTREN LIPID MICROSPHERE
1.0000 mL | INTRAVENOUS | Status: AC | PRN
  Administered 2021-11-17: 2 mL via INTRAVENOUS
  Filled 2021-11-17: qty 10

## 2021-11-17 NOTE — Consult Note (Signed)
NAME:  Edna Grover, MRN:  [id number], DOB:  [birth year], LOS: 1 ADMISSION DATE:  11/16/2021, CONSULTATION DATE:  11/17/21 REFERRING MD:  TRH CHIEF COMPLAINT:  Acute Hypoxia Respiratory Failure   History of Present Illness:  Buckley Bradly is a 71 y.o. male with history of CAD status post stenting, COPD, HIV, chronic kidney disease stage III, hypertension had presented to the ER at [facility] with a fall.  Patient denies hitting his head or losing consciousness.  He states his legs gave way and he fell. Pt had planned hip surgery but has been becoming hypoxic and PCCM consulted for hypoxia. Workup so far includes Negative Covid/Flu/RSV, CTA for PE negative but did show pulmonary nodules some which are unchanged from prior and some new ones. CBC showing leukocytosis that is improving and CXR showing bilateral opacities but negative for effusion or pneumothorax. CXR also shows hyperinflation of the lungs. Troponin negative and BNP elevated 313.9. Pt did receive 20 mg IV lasix this am.  Pertinent  Medical History  CAD status post stenting, COPD, HIV, chronic kidney disease stage III, hypertension  Interim History / Subjective:  Patient states he feels his breathing is doing well. His oxygen was turned off and pt did not endorse any dyspnea.  Review of Systems:   Negative unless stated in the HPI. Does not report any fevers, or chills.  Objective   Blood pressure (!) 149/66, pulse 65, temperature 98.2 F (36.8 C), temperature source Oral, resp. rate 15, height 5' 10.5" (1.791 m), weight 91 kg, SpO2 91 %.        Intake/Output Summary (Last 24 hours) at 11/17/2021 0947 Last data filed at 11/17/2021 7673 Gross per 24 hour  Intake 270 ml  Output 1250 ml  Net -980 ml   Filed Weights   11/17/21 0057  Weight: 91 kg   Examination: General: NAD HENT: On , MMM Lungs: Expiratory wheeze present, rhonchi present Cardiovascular: NSR, 2+ pulses Abdomen: No TTP, normal bowel sounds Extremities:  bruising noted on upper extremities Neuro: alert and oriented x4 GU: no foley present  Assessment & Plan:  Acute hypoxic Respiratory Failure Was noted after admission to [facility]. Pt had increasing oxygen requirement and was transferred to [facility]. Pt received abx there and has received one dose of Levaquin here. Low concern for pneumonia as pt did not have any fevers, or chills. Will change abx to doxycycline and finish 5 days of total abx. Pt received 80 mg solumedrol and will change it to 40 mg prednisone for total of 5 days. Appears pt has moderate COPD exacerbation and I suspect his oxygen saturation have been on the lower end chronically. Pt does not get tachypneic with saturation in the high 80s. Low concern pt has opportunistic infection. Pt is slightly volume overloaded with elevated BNP and this could be contributing to his hypoxia. Will get TTE and give additional dose of IV lasix 20 mg qd. Overall pt at baseline risk for his surgery.  -Change abx to doxycycline 100 mg BID for 3 additional days -Change steroids to prednisone 40 mg qd for 4 days -Continue duonebs and pulmicort.  -Give lasix 20 mg once -Obtain TTE and repeat BNP tomorrow -Counseled on smoking cessation  -Pt states he had stopped smoking for a while but restarted after his sisters passed away.   -Encouraged smoking cessation and started nicotine patch  -Encourage Incentive spirometry use   Chronic conditions per primary team.   Best Practice (right click and "Reselect all SmartList Selections"  daily)  Diet/type: NPO DVT prophylaxis: SCD GI prophylaxis: N/A Lines: N/A Foley:  N/A Continuous: None  Code Status:  full code Labs   CBC: Recent Labs  Lab 11/16/21 2025 11/16/21 2227  WBC 14.5* 13.9*  NEUTROABS 12.4*  --   HGB 15.9 16.1  HCT 45.4 45.4  MCV 99.6 99.6  PLT 145* 161*   Basic Metabolic Panel: Recent Labs  Lab 11/16/21 2025 11/16/21 2227  NA 136 136  K 5.0 4.3  CL 103 104  CO2 27 23  GLUCOSE 121*  121*  BUN 29* 29*  CREATININE 1.28* 1.20  CALCIUM 9.1 8.9   GFR: Estimated Creatinine Clearance: 64.6 mL/min (by C-G formula based on SCr of 1.2 mg/dL). Recent Labs  Lab 11/16/21 2025 11/16/21 2227  WBC 14.5* 13.9*   Liver Function Tests: Recent Labs  Lab 11/16/21 2025  AST 20  ALT 22  ALKPHOS 63  BILITOT 0.9  PROT 6.3*  ALBUMIN 2.7*   No results for input(s): "LIPASE", "AMYLASE" in the last 168 hours. No results for input(s): "AMMONIA" in the last 168 hours. ABG    Component Value Date/Time   TCO2 20 (L) 01/14/2018 0742    Coagulation Profile: No results for input(s): "INR", "PROTIME" in the last 168 hours. Cardiac Enzymes: No results for input(s): "CKTOTAL", "CKMB", "CKMBINDEX", "TROPONINI" in the last 168 hours. HbA1C: Hgb A1c MFr Bld  Date/Time Value Ref Range Status  01/14/2018 07:45 AM 4.8 4.8 - 5.6 % Final    Comment:    (NOTE) Pre diabetes:          5.7%-6.4% Diabetes:              >6.4% Glycemic control for   <7.0% adults with diabetes    CBG: No results for input(s): "GLUCAP" in the last 168 hours. Past Medical History:  He,  has a past medical history of CAD (coronary artery disease), COPD (chronic obstructive pulmonary disease) ([place]), Essential hypertension, Grover's disease (2017), HIV (human immunodeficiency virus infection) ([place]), Polycythemia (2017), Seasonal allergies (2017), ST elevation myocardial infarction (STEMI) of inferior wall ([place]), Stroke (cerebrum) ([place]), and Vitamin D deficiency.  Surgical History:   Past Surgical History:  Procedure Laterality Date   ABDOMINAL AORTOGRAM W/LOWER EXTREMITY Bilateral 12/02/2018   Procedure: ABDOMINAL AORTOGRAM W/LOWER EXTREMITY;  Surgeon: Lorretta Harp, MD;  Location: [place] [facility];  Service: Cardiovascular;  Laterality: Bilateral;   CORONARY STENT INTERVENTION N/A 01/16/2018   Procedure: CORONARY STENT INTERVENTION;  Surgeon: Troy Sine, MD;  Location: [place] [facility];  Service:  Cardiovascular;  Laterality: N/A;   CORONARY/GRAFT ACUTE MI REVASCULARIZATION N/A 01/14/2018   Procedure: Coronary/Graft Acute MI Revascularization;  Surgeon: Nelva Bush, MD;  Location: [place] [facility];  Service: Cardiovascular;  Laterality: N/A;   LEFT HEART CATH N/A 01/16/2018   Procedure: Left Heart Cath;  Surgeon: Troy Sine, MD;  Location: [place] [facility];  Service: Cardiovascular;  Laterality: N/A;   LEFT HEART CATH AND CORONARY ANGIOGRAPHY N/A 01/14/2018   Procedure: LEFT HEART CATH AND CORONARY ANGIOGRAPHY;  Surgeon: Nelva Bush, MD;  Location: [place] [facility];  Service: Cardiovascular;  Laterality: N/A;   PERIPHERAL VASCULAR ATHERECTOMY  12/02/2018   Procedure: PERIPHERAL VASCULAR ATHERECTOMY;  Surgeon: Lorretta Harp, MD;  Location: [place] [facility];  Service: Cardiovascular;;  Left SFA   PERIPHERAL VASCULAR BALLOON ANGIOPLASTY  12/02/2018   Procedure: PERIPHERAL VASCULAR BALLOON ANGIOPLASTY;  Surgeon: Lorretta Harp, MD;  Location: [place] [facility];  Service: Cardiovascular;;  Left SFA   ULTRASOUND GUIDANCE FOR VASCULAR ACCESS  01/16/2018   Procedure: Ultrasound Guidance For Vascular Access;  Surgeon: Troy Sine, MD;  Location: [place] [facility];  Service: Cardiovascular;;    Social History:   reports that he has been smoking cigarettes. He started smoking about 57 years ago. He has been smoking an average of 1 pack per day. He has never used smokeless tobacco. He reports that he does not currently use alcohol. He reports that he does not currently use drugs after having used the following drugs: Marijuana.  Family History:  His family history includes Heart disease in his mother.  Allergies Allergies  Allergen Reactions   Penicillins Anaphylaxis    High fever  Did it involve swelling of the face/tongue/throat, SOB, or low BP? No Did it involve sudden or severe rash/hives, skin peeling, or any reaction on the inside of your mouth or nose?  No Did you need to seek medical attention at a hospital or doctor's office? Yes When did it last happen?      45 + years If all above answers are "NO", may proceed with cephalosporin use.     Tape Other (See Comments)    Bruising of skin     Home Medications  Prior to Admission medications   Medication Sig Start Date End Date Taking? Authorizing Provider  amLODipine (NORVASC) 5 MG tablet Take 1 tablet (5 mg total) by mouth daily. 05/19/20   Satira Sark, MD  aspirin EC 81 MG tablet Take 1 tablet (81 mg total) by mouth daily. 01/17/18   Strader, Fransisco Hertz, PA-C  atorvastatin (LIPITOR) 20 MG tablet Take 1 tablet (20 mg total) by mouth daily. 05/24/20   Verta Ellen., NP  baclofen (LIORESAL) 10 MG tablet Take 10 mg by mouth daily.    [provider]  BIKTARVY 50-200-25 MG TABS tablet TAKE 1 TABLET BY MOUTH DAILY 10/26/21   Michel Bickers, MD  carvedilol (COREG) 25 MG tablet Take 1 tablet (25 mg total) by mouth 2 (two) times daily. 05/24/20   Verta Ellen., NP  Cholecalciferol (VITAMIN D3) 25 MCG (1000 UT) CAPS Take 1-2 capsules by mouth daily.    [provider]  clopidogrel (PLAVIX) 75 MG tablet Take 1 tablet (75 mg total) by mouth daily. 05/24/20   Verta Ellen., NP  hydrochlorothiazide (HYDRODIURIL) 25 MG tablet TAKE 1 TABLET(25 MG) BY MOUTH DAILY 10/03/21   Satira Sark, MD  losartan (COZAAR) 25 MG tablet Take 25 mg by mouth daily.    [provider]  nicotine (NICODERM CQ - DOSED IN MG/24 HOURS) 21 mg/24hr patch Place onto the skin.    [provider]  nicotine polacrilex (COMMIT) 4 MG lozenge Place inside cheek.    [provider]  STIOLTO RESPIMAT 2.5-2.5 MCG/ACT AERS Inhale 1 puff into the lungs daily. 03/25/20   [provider]  VENTOLIN HFA 108 (90 Base) MCG/ACT inhaler Take 1-2 puffs by mouth every 6 (six) hours as needed for shortness of breath or wheezing. 10/18/17   [provider]    Idamae Schuller,  MD Tillie Rung. [facility] Internal Medicine Residency, PGY-2

## 2021-11-17 NOTE — Plan of Care (Signed)

## 2021-11-17 NOTE — Progress Notes (Signed)
Pt reports itchiness from PO hydrocodone. Notified MD

## 2021-11-17 NOTE — Consult Note (Signed)
Reason for Consult:Right hip fx Referring Physician: Debbe Odea Time called: 7893 Time at bedside: Nicholas [name] is an 71 y.o. male.  HPI: [name] fell at home in the bathroom. He's unsure what caused the fall other than his leg gave out on him. He had immediate right hip pain and could not get up. He was brought to the ED at [facility] where x-rays showed a right hip fx. Because of some hypoxia concerns he was transferred to [facility] for definitive care. He lives at home and does not use any assistive devices to ambulate.  Past Medical History:  Diagnosis Date   CAD (coronary artery disease)    Overlapping DES x2 to the mid to distal circumflex extending into OM 3 01/14/2018, DES x2 to the mid to distal RCA 01/16/2018   COPD (chronic obstructive pulmonary disease) ([place])    Essential hypertension    Grover's disease 2017   HIV (human immunodeficiency virus infection) ([place])    Polycythemia 2017   Seasonal allergies 2017   ST elevation myocardial infarction (STEMI) of inferior wall [facility])    November 2019   Stroke (cerebrum) [facility])    Vitamin D deficiency     Past Surgical History:  Procedure Laterality Date   ABDOMINAL AORTOGRAM W/LOWER EXTREMITY Bilateral 12/02/2018   Procedure: ABDOMINAL AORTOGRAM W/LOWER EXTREMITY;  Surgeon: Lorretta Harp, MD;  Location: [place] [facility];  Service: Cardiovascular;  Laterality: Bilateral;   CORONARY STENT INTERVENTION N/A 01/16/2018   Procedure: CORONARY STENT INTERVENTION;  Surgeon: Troy Sine, MD;  Location: [place] [facility];  Service: Cardiovascular;  Laterality: N/A;   CORONARY/GRAFT ACUTE MI REVASCULARIZATION N/A 01/14/2018   Procedure: Coronary/Graft Acute MI Revascularization;  Surgeon: Nelva Bush, MD;  Location: [place] [facility];  Service: Cardiovascular;  Laterality: N/A;   LEFT HEART CATH N/A 01/16/2018   Procedure: Left Heart Cath;  Surgeon: Troy Sine, MD;  Location: [place] [facility];  Service: Cardiovascular;   Laterality: N/A;   LEFT HEART CATH AND CORONARY ANGIOGRAPHY N/A 01/14/2018   Procedure: LEFT HEART CATH AND CORONARY ANGIOGRAPHY;  Surgeon: Nelva Bush, MD;  Location: [place] [facility];  Service: Cardiovascular;  Laterality: N/A;   PERIPHERAL VASCULAR ATHERECTOMY  12/02/2018   Procedure: PERIPHERAL VASCULAR ATHERECTOMY;  Surgeon: Lorretta Harp, MD;  Location: [place] [facility];  Service: Cardiovascular;;  Left SFA   PERIPHERAL VASCULAR BALLOON ANGIOPLASTY  12/02/2018   Procedure: PERIPHERAL VASCULAR BALLOON ANGIOPLASTY;  Surgeon: Lorretta Harp, MD;  Location: [place] [facility];  Service: Cardiovascular;;  Left SFA   ULTRASOUND GUIDANCE FOR VASCULAR ACCESS  01/16/2018   Procedure: Ultrasound Guidance For Vascular Access;  Surgeon: Troy Sine, MD;  Location: [place] [facility];  Service: Cardiovascular;;    Family History  Problem Relation Age of Onset   Heart disease Mother     Social History:  reports that he has been smoking cigarettes. He started smoking about 57 years ago. He has been smoking an average of 1 pack per day. He has never used smokeless tobacco. He reports that he does not currently use alcohol. He reports that he does not currently use drugs after having used the following drugs: Marijuana.  Allergies:  Allergies  Allergen Reactions   Penicillins Anaphylaxis    High fever  Did it involve swelling of the face/tongue/throat, SOB, or low BP? No Did it involve sudden or severe rash/hives, skin peeling, or any reaction on the inside of your mouth  or nose? No Did you need to seek medical attention at a hospital or doctor's office? Yes When did it last happen?      45 + years If all above answers are "NO", may proceed with cephalosporin use.     Tape Other (See Comments)    Bruising of skin     Medications: I have reviewed the patient's current medications.  Results for orders placed or performed during the hospital encounter of 11/16/21 (from the  past 48 hour(s))  CBC with Differential/Platelet     Status: Abnormal   Collection Time: 11/16/21  8:25 PM  Result Value Ref Range   WBC 14.5 (H) 4.0 - 10.5 K/uL   RBC 4.56 4.22 - 5.81 MIL/uL   Hemoglobin 15.9 13.0 - 17.0 g/dL   HCT 45.4 39.0 - 52.0 %   MCV 99.6 80.0 - 100.0 fL   MCH 34.9 (H) 26.0 - 34.0 pg   MCHC 35.0 30.0 - 36.0 g/dL   RDW 12.7 11.5 - 15.5 %   Platelets 145 (L) 150 - 400 K/uL   nRBC 0.0 0.0 - 0.2 %   Neutrophils Relative % 85 %   Neutro Abs 12.4 (H) 1.7 - 7.7 K/uL   Lymphocytes Relative 7 %   Lymphs Abs 1.0 0.7 - 4.0 K/uL   Monocytes Relative 7 %   Monocytes Absolute 1.0 0.1 - 1.0 K/uL   Eosinophils Relative 0 %   Eosinophils Absolute 0.0 0.0 - 0.5 K/uL   Basophils Relative 0 %   Basophils Absolute 0.0 0.0 - 0.1 K/uL   Immature Granulocytes 1 %   Abs Immature Granulocytes 0.09 (H) 0.00 - 0.07 K/uL    Comment: Performed at [facility], [address] [place], [place] [address]  Comprehensive metabolic panel     Status: Abnormal   Collection Time: 11/16/21  8:25 PM  Result Value Ref Range   Sodium 136 135 - 145 mmol/L   Potassium 5.0 3.5 - 5.1 mmol/L   Chloride 103 98 - 111 mmol/L   CO2 27 22 - 32 mmol/L   Glucose, Bld 121 (H) 70 - 99 mg/dL    Comment: Glucose reference range applies only to samples taken after fasting for at least 8 hours.   BUN 29 (H) 8 - 23 mg/dL   Creatinine, Ser 1.28 (H) 0.61 - 1.24 mg/dL   Calcium 9.1 8.9 - 10.3 mg/dL   Total Protein 6.3 (L) 6.5 - 8.1 g/dL   Albumin 2.7 (L) 3.5 - 5.0 g/dL   AST 20 15 - 41 U/L   ALT 22 0 - 44 U/L   Alkaline Phosphatase 63 38 - 126 U/L   Total Bilirubin 0.9 0.3 - 1.2 mg/dL   GFR, Estimated 60 (L) >60 mL/min    Comment: (NOTE) Calculated using the CKD-EPI Creatinine Equation (2021)    Anion gap 6 5 - 15    Comment: Performed at [facility], [place] [address], [place], [place] [address]  Brain natriuretic peptide     Status: Abnormal   Collection Time: 11/16/21  8:25 PM  Result Value  Ref Range   B Natriuretic Peptide 313.9 (H) 0.0 - 100.0 pg/mL    Comment: Performed at [place] [address], [place], [place] [address]  Troponin I (High Sensitivity)     Status: None   Collection Time: 11/16/21  8:25 PM  Result Value Ref Range   Troponin I (High Sensitivity) 8 <18 ng/L  Comment: (NOTE) Elevated high sensitivity troponin I (hsTnI) values and significant  changes across serial measurements may suggest ACS but many other  chronic and acute conditions are known to elevate hsTnI results.  Refer to the "Links" section for chest pain algorithms and additional  guidance. Performed at [facility], [place] [address], [place], [place] [address]   Type and screen [place]     Status: None   Collection Time: 11/16/21  8:27 PM  Result Value Ref Range   ABO/RH(D) A POS    Antibody Screen NEG    Sample Expiration      11/19/2021,2359 Performed at [facility], [place] [address] [place], [place] [address]   CBC     Status: Abnormal   Collection Time: 11/16/21 10:27 PM  Result Value Ref Range   WBC 13.9 (H) 4.0 - 10.5 K/uL   RBC 4.56 4.22 - 5.81 MIL/uL   Hemoglobin 16.1 13.0 - 17.0 g/dL   HCT 45.4 39.0 - 52.0 %   MCV 99.6 80.0 - 100.0 fL   MCH 35.3 (H) 26.0 - 34.0 pg   MCHC 35.5 30.0 - 36.0 g/dL   RDW 12.8 11.5 - 15.5 %   Platelets 137 (L) 150 - 400 K/uL   nRBC 0.0 0.0 - 0.2 %    Comment: Performed at [facility], [place] [address], [place], [place] [address]  Basic metabolic panel     Status: Abnormal   Collection Time: 11/16/21 10:27 PM  Result Value Ref Range   Sodium 136 135 - 145 mmol/L   Potassium 4.3 3.5 - 5.1 mmol/L   Chloride 104 98 - 111 mmol/L   CO2 23 22 - 32 mmol/L   Glucose, Bld 121 (H) 70 - 99 mg/dL    Comment: Glucose reference range applies only to samples taken after fasting for at least 8 hours.   BUN 29 (H) 8 - 23 mg/dL   Creatinine, Ser 1.20 0.61 - 1.24 mg/dL   Calcium 8.9 8.9 - 10.3 mg/dL   GFR,  Estimated >60 >60 mL/min    Comment: (NOTE) Calculated using the CKD-EPI Creatinine Equation (2021)    Anion gap 9 5 - 15    Comment: Performed at [place] [address], [place], [place] [address]  ABO/Rh     Status: None   Collection Time: 11/16/21 10:29 PM  Result Value Ref Range   ABO/RH(D)      A POS Performed at [place] [address], [place], [place] [address]   Lactate dehydrogenase     Status: Abnormal   Collection Time: 11/17/21  5:46 AM  Result Value Ref Range   LDH 261 (H) 98 - 192 U/L    Comment: HEMOLYSIS AT THIS LEVEL MAY AFFECT RESULT Performed at [facility], [place] [address] [place], [place] [address]   SARS Coronavirus 2 by RT PCR (hospital order, performed in [facility] hospital lab) *cepheid single result test* Anterior Nasal Swab     Status: None   Collection Time: 11/17/21  6:22 AM   Specimen: Anterior Nasal Swab  Result Value Ref Range   SARS Coronavirus 2 by RT PCR NEGATIVE NEGATIVE    Comment: (NOTE) SARS-CoV-2 target nucleic acids are NOT DETECTED.  The SARS-CoV-2 RNA is generally detectable in upper and lower respiratory specimens during the acute phase of infection. The lowest concentration of SARS-CoV-2 viral copies this assay can detect is 250  copies / mL. A negative result does not preclude SARS-CoV-2 infection and should not be used as the sole basis for treatment or other patient management decisions.  A negative result may occur with improper specimen collection / handling, submission of specimen other than nasopharyngeal swab, presence of viral mutation(s) within the areas targeted by this assay, and inadequate number of viral copies (<250 copies / mL). A negative result must be combined with clinical observations, patient history, and epidemiological information.  Fact Sheet for Patients:   https://www.patel.info/  Fact Sheet for Healthcare  Providers: https://hall.com/  This test is not yet approved or  cleared by the [place] FDA and has been authorized for detection and/or diagnosis of SARS-CoV-2 by FDA under an Emergency Use Authorization (EUA).  This EUA will remain in effect (meaning this test can be used) for the duration of the COVID-19 declaration under Section 564(b)(1) of the Act, 21 U.S.C. section 360bbb-3(b)(1), unless the authorization is terminated or revoked sooner.  Performed at [facility], [place] [address] [place], [place] [address]     DG CHEST PORT 1 VIEW  Result Date: 11/16/2021 CLINICAL DATA:  Shortness of breath. EXAM: PORTABLE CHEST 1 VIEW COMPARISON:  11/15/2021. FINDINGS: The heart size and mediastinal contours are stable. There is atherosclerotic calcification of the aorta. Emphysematous changes are present in the lungs. Coarse interstitial markings are noted bilaterally. Patchy airspace opacities are noted in the mid to lower lung fields bilaterally. No effusion or pneumothorax. No acute osseous abnormality. IMPRESSION: 1. Scattered airspace opacities in the mid to lower lung fields bilaterally, possible edema or infiltrate. 2. Emphysema. 3. Aortic atherosclerosis. Electronically Signed   By: Brett Fairy M.D.   On: 11/16/2021 20:39    Review of Systems  HENT:  Negative for ear discharge, ear pain, hearing loss and tinnitus.   Eyes:  Negative for photophobia and pain.  Respiratory:  Negative for cough and shortness of breath.   Cardiovascular:  Negative for chest pain.  Gastrointestinal:  Negative for abdominal pain, nausea and vomiting.  Genitourinary:  Negative for dysuria, flank pain, frequency and urgency.  Musculoskeletal:  Positive for arthralgias (Right hip). Negative for back pain, myalgias and neck pain.  Neurological:  Negative for dizziness and headaches.  Hematological:  Does not bruise/bleed easily.  Psychiatric/Behavioral:  The patient is not  nervous/anxious.    Blood pressure (!) 149/66, pulse 65, temperature 98.2 F (36.8 C), temperature source Oral, resp. rate 15, height 5' 10.5" (1.791 m), weight 91 kg, SpO2 91 %. Physical Exam Constitutional:      General: He is not in acute distress.    Appearance: He is well-developed. He is not diaphoretic.  HENT:     Head: Normocephalic and atraumatic.  Eyes:     General: No scleral icterus.       Right eye: No discharge.        Left eye: No discharge.     Conjunctiva/sclera: Conjunctivae normal.  Cardiovascular:     Rate and Rhythm: Normal rate and regular rhythm.  Pulmonary:     Effort: Pulmonary effort is normal. No respiratory distress.  Musculoskeletal:     Cervical back: Normal range of motion.     Comments: RLE No traumatic wounds, ecchymosis, or rash  Mod TTP hip  No knee or ankle effusion  Knee stable to varus/ valgus and anterior/posterior stress  Sens DPN, SPN, TN intact  Motor EHL, ext, flex, evers 5/5  DP 2+, PT 2+, No significant edema  Skin:    General: Skin is warm and dry.  Neurological:     Mental Status: He is alert.  Psychiatric:        Mood and Affect: Mood normal.        Behavior: Behavior normal.     Assessment/Plan: Right hip fx -- Plan THA today with Dr. Zachery Dakins. Please keep NPO. Multiple medical problems including history of CAD status post stenting, COPD, HIV, chronic kidney disease stage III, and hypertension -- per primary service    Lisette Abu, PA-C Orthopedic Surgery [phone] 11/17/2021, 9:36 AM

## 2021-11-17 NOTE — Progress Notes (Addendum)
Initial Nutrition Assessment  DOCUMENTATION CODES:   Not applicable  INTERVENTION:  - Add Ensure q day once diet is advanced. (Pt prefers chocolate).   NUTRITION DIAGNOSIS:   Increased nutrient needs related to hip fracture as evidenced by estimated needs.  GOAL:   Patient will meet greater than or equal to 90% of their needs  MONITOR:   PO intake  REASON FOR ASSESSMENT:   Consult Assessment of nutrition requirement/status, Hip fracture protocol  ASSESSMENT:   71 y.o. male admits related to a fall. PMH includes: CAD, COPD, HIV, CKD stage 3 and HTN. Pt is currently receiving medical management related to right hip fracture and acute respiratory failure with hypoxia.  Meds reviewed. Labs reviewed.   Pt is currently NPO for procedure. Pt reports that he has a good appetite and was eating well PTA. He states that he typically eats 2 meals per day at home and this is normal for him. He denies any recent weight changes and states that his UBW is anywhere from 179-183 lbs normally. Pt is currently weighing 200 lbs per record. Pt with increased nutrient needs r/t healing in setting of hip fracture. Pt agreed to trial Ensure q day once diet is advanced. RD will continue to monitor PO intakes.   NUTRITION - FOCUSED PHYSICAL EXAM:  Flowsheet Row Most Recent Value  Orbital Region No depletion  Upper Arm Region No depletion  Thoracic and Lumbar Region No depletion  Buccal Region No depletion  Temple Region No depletion  Clavicle Bone Region Mild depletion  Clavicle and Acromion Bone Region No depletion  Scapular Bone Region No depletion  Dorsal Hand No depletion  Patellar Region No depletion  Anterior Thigh Region No depletion  Posterior Calf Region No depletion  Edema (RD Assessment) None  Hair Reviewed  Eyes Reviewed  Mouth Reviewed  Skin Reviewed  Nails Reviewed       Diet Order:   Diet Order             Diet NPO time specified Except for: Sips with Meds  Diet  effective now                   EDUCATION NEEDS:   No education needs have been identified at this time  Skin:  Skin Assessment: Reviewed RN Assessment  Last BM:  11/16/21  Height:   Ht Readings from Last 1 Encounters:  11/17/21 5' 10.5" (1.791 m)    Weight:   Wt Readings from Last 1 Encounters:  11/17/21 91 kg    Ideal Body Weight:  76.8 kg  BMI:  Body mass index is 28.38 kg/m.  Estimated Nutritional Needs:   Kcal:  1751-0258 kcals  Protein:  110-135 gm  Fluid:  2275-2730 mL  Thalia Bloodgood, RD, LDN, CNSC

## 2021-11-17 NOTE — Progress Notes (Signed)
Called ortho tech. Per ortho tech, trapeze bar is provided post surgery.

## 2021-11-17 NOTE — Progress Notes (Signed)
Unable to proceed with surgery to address FN fx today due to lack of OR availability. Time available tomorrow afternoon with Dr. Ninfa Linden. Okay for diet today. NPO at midnight. Plan discussed with RN who will communicate to patient.

## 2021-11-17 NOTE — Progress Notes (Signed)
[facility] Progress Note  Patient: Nicholas [name]     JQB:[id number]  DOA: 11/16/2021   PCP: [place] Nation, MD       Brief hospital course: This is a 71 year old male with COPD who continues to smoke, HIV, coronary artery disease status post stenting, CKD stage III and hypertension who presented to [facility] after a fall. In the ED, he was found to have a right hip fracture.  He became hypoxic and required 6 L of oxygen.  CT angiogram was performed and was negative for PE but was concerning for bronchitis.  He was started on IV steroids and antibiotics and transferred to [facility]. Chest x-ray reveals bilateral opacities hyperinflation.  The patient was given IV Lasix times once.   Subjective:  Complaining of pain in his right leg radiating up towards his right back  Assessment and Plan: Principal Problem:   Closed right hip fracture ([place]) - appreciate ortho eval- plan for THA today  Active Problems:   Acute respiratory failure with hypoxia ([place])- h/o COPD - appreciate PCCM eval - has been weaned down to 2 l today - COVID, RSV, flu negative - f/u ECHO  - cont Prednisone  Thrombocytopenia - acute- follow    HIV disease ([place]) - cont Biktarvy  HTN - cont Amlodipine, Losartan, Carvedilol   Cigarette smoker - smokes 1ppd- cont Nicoderm patch       Code Status: Full Code DVT prophylaxis:    Consultants: PCCM, Ortho Level of Care: Level of care: Progressive  Objective:   Vitals:   11/17/21 0600 11/17/21 0601 11/17/21 0824 11/17/21 0826  BP:  (!) 144/76 (!) 149/66   Pulse:  74 65   Resp:  16 15   Temp: 97.8 F (36.6 C) 98 F (36.7 C) 98.2 F (36.8 C)   TempSrc:  Oral Oral   SpO2:  96% 92% 91%  Weight:      Height:       Filed Weights   11/17/21 0057  Weight: 91 kg   Exam: General exam: Appears comfortable  HEENT: oral mucosa moist Respiratory system: Clear to auscultation. Mild cough Cardiovascular system: S1 & S2 heard   Gastrointestinal system: Abdomen soft, non-tender, nondistended. Normal bowel sounds   Extremities: No cyanosis, clubbing or edema Psychiatry:  Mood & affect appropriate.    Imaging and lab data was personally reviewed    CBC: Recent Labs  Lab 11/16/21 2025 11/16/21 2227  WBC 14.5* 13.9*  NEUTROABS 12.4*  --   HGB 15.9 16.1  HCT 45.4 45.4  MCV 99.6 99.6  PLT 145* 409*   Basic Metabolic Panel: Recent Labs  Lab 11/16/21 2025 11/16/21 2227  NA 136 136  K 5.0 4.3  CL 103 104  CO2 27 23  GLUCOSE 121* 121*  BUN 29* 29*  CREATININE 1.28* 1.20  CALCIUM 9.1 8.9   GFR: Estimated Creatinine Clearance: 64.6 mL/min (by C-G formula based on SCr of 1.2 mg/dL).  Scheduled Meds:  amLODipine  5 mg Oral Daily   atorvastatin  20 mg Oral Daily   bictegravir-emtricitabine-tenofovir AF  1 tablet Oral Daily   budesonide (PULMICORT) nebulizer solution  0.25 mg Nebulization BID   carvedilol  25 mg Oral BID   [START ON 11/18/2021] doxycycline  100 mg Oral Q12H   ipratropium-albuterol  3 mL Nebulization BID   losartan  25 mg Oral Daily   nicotine  21 mg Transdermal Daily   predniSONE  40 mg Oral Q breakfast  Continuous Infusions:   LOS: 1 day   Author: Debbe Odea  11/17/2021 1:52 PM

## 2021-11-18 ENCOUNTER — Other Ambulatory Visit: Payer: Self-pay

## 2021-11-18 ENCOUNTER — Inpatient Hospital Stay (HOSPITAL_COMMUNITY): Payer: Medicare HMO | Admitting: Anesthesiology

## 2021-11-18 ENCOUNTER — Inpatient Hospital Stay (HOSPITAL_COMMUNITY): Payer: Medicare HMO

## 2021-11-18 ENCOUNTER — Encounter (HOSPITAL_COMMUNITY): Payer: Self-pay | Admitting: Internal Medicine

## 2021-11-18 ENCOUNTER — Encounter (HOSPITAL_COMMUNITY): Admission: AD | Disposition: A | Discharge status: Home Health | Payer: Self-pay | Attending: Internal Medicine

## 2021-11-18 DIAGNOSIS — S72001A Fracture of unspecified part of neck of right femur, initial encounter for closed fracture: Secondary | ICD-10-CM | POA: Diagnosis not present

## 2021-11-18 DIAGNOSIS — S72041A Displaced fracture of base of neck of right femur, initial encounter for closed fracture: Secondary | ICD-10-CM | POA: Diagnosis not present

## 2021-11-18 DIAGNOSIS — F17210 Nicotine dependence, cigarettes, uncomplicated: Secondary | ICD-10-CM

## 2021-11-18 DIAGNOSIS — J432 Centrilobular emphysema: Secondary | ICD-10-CM | POA: Diagnosis not present

## 2021-11-18 DIAGNOSIS — I2510 Atherosclerotic heart disease of native coronary artery without angina pectoris: Secondary | ICD-10-CM

## 2021-11-18 DIAGNOSIS — I252 Old myocardial infarction: Secondary | ICD-10-CM

## 2021-11-18 DIAGNOSIS — I10 Essential (primary) hypertension: Secondary | ICD-10-CM

## 2021-11-18 HISTORY — PX: TOTAL HIP ARTHROPLASTY: SHX124

## 2021-11-18 SURGERY — ARTHROPLASTY, HIP, TOTAL,POSTERIOR APPROACH
Anesthesia: Monitor Anesthesia Care | Site: Hip | Laterality: Right

## 2021-11-18 MED ORDER — VANCOMYCIN HCL IN DEXTROSE 1-5 GM/200ML-% IV SOLN
INTRAVENOUS | Status: AC
  Administered 2021-11-18: 1000 mg via INTRAVENOUS
  Filled 2021-11-18: qty 200

## 2021-11-18 MED ORDER — ACETAMINOPHEN 500 MG PO TABS: 1000.0000 mg | ORAL_TABLET | Freq: Once | ORAL | Status: AC

## 2021-11-18 MED ORDER — TRANEXAMIC ACID-NACL 1000-0.7 MG/100ML-% IV SOLN: 1000.0000 mg | INTRAVENOUS | Status: DC

## 2021-11-18 MED ORDER — VANCOMYCIN HCL IN DEXTROSE 1-5 GM/200ML-% IV SOLN
1000.0000 mg | Freq: Two times a day (BID) | INTRAVENOUS | Status: AC
  Administered 2021-11-19: 1000 mg via INTRAVENOUS
  Filled 2021-11-18: qty 200

## 2021-11-18 MED ORDER — AMISULPRIDE (ANTIEMETIC) 5 MG/2ML IV SOLN: 10.0000 mg | Freq: Once | INTRAVENOUS | Status: DC | PRN

## 2021-11-18 MED ORDER — FENTANYL CITRATE (PF) 100 MCG/2ML IJ SOLN
25.0000 ug | INTRAMUSCULAR | Status: DC | PRN
  Administered 2021-11-18: 50 ug via INTRAVENOUS

## 2021-11-18 MED ORDER — LIDOCAINE 2% (20 MG/ML) 5 ML SYRINGE
INTRAMUSCULAR | Status: AC
  Filled 2021-11-18: qty 5

## 2021-11-18 MED ORDER — OXYCODONE HCL 5 MG PO TABS
5.0000 mg | ORAL_TABLET | ORAL | Status: DC | PRN
  Administered 2021-11-18: 5 mg via ORAL
  Administered 2021-11-20: 10 mg via ORAL
  Filled 2021-11-18: qty 1
  Filled 2021-11-18: qty 2
  Filled 2021-11-18: qty 1
  Filled 2021-11-18 (×4): qty 2

## 2021-11-18 MED ORDER — PROPOFOL 500 MG/50ML IV EMUL
INTRAVENOUS | Status: DC | PRN
  Administered 2021-11-18: 100 ug/kg/min via INTRAVENOUS

## 2021-11-18 MED ORDER — DEXAMETHASONE SODIUM PHOSPHATE 10 MG/ML IJ SOLN
INTRAMUSCULAR | Status: AC
  Filled 2021-11-18: qty 1

## 2021-11-18 MED ORDER — ONDANSETRON HCL 4 MG PO TABS: 4.0000 mg | ORAL_TABLET | Freq: Four times a day (QID) | ORAL | Status: DC | PRN

## 2021-11-18 MED ORDER — METOCLOPRAMIDE HCL 5 MG PO TABS: 5.0000 mg | ORAL_TABLET | Freq: Three times a day (TID) | ORAL | Status: DC | PRN

## 2021-11-18 MED ORDER — DOCUSATE SODIUM 100 MG PO CAPS
100.0000 mg | ORAL_CAPSULE | Freq: Two times a day (BID) | ORAL | Status: DC
  Administered 2021-11-19 – 2021-11-20 (×2): 100 mg via ORAL
  Filled 2021-11-18 (×6): qty 1

## 2021-11-18 MED ORDER — ACETAMINOPHEN 325 MG PO TABS: 325.0000 mg | ORAL_TABLET | Freq: Four times a day (QID) | ORAL | Status: DC | PRN

## 2021-11-18 MED ORDER — SODIUM CHLORIDE 0.9 % IR SOLN
Status: DC | PRN
  Administered 2021-11-18: 1000 mL

## 2021-11-18 MED ORDER — METOCLOPRAMIDE HCL 5 MG/ML IJ SOLN: 5.0000 mg | Freq: Three times a day (TID) | INTRAMUSCULAR | Status: DC | PRN

## 2021-11-18 MED ORDER — MENTHOL 3 MG MT LOZG: 1.0000 | LOZENGE | OROMUCOSAL | Status: DC | PRN

## 2021-11-18 MED ORDER — CHLORHEXIDINE GLUCONATE 0.12 % MT SOLN
OROMUCOSAL | Status: AC
  Administered 2021-11-18: 15 mL via OROMUCOSAL
  Filled 2021-11-18: qty 15

## 2021-11-18 MED ORDER — LACTATED RINGERS IV SOLN: INTRAVENOUS | Status: DC

## 2021-11-18 MED ORDER — CHLORHEXIDINE GLUCONATE 0.12 % MT SOLN: 15.0000 mL | Freq: Once | OROMUCOSAL | Status: AC

## 2021-11-18 MED ORDER — FENTANYL CITRATE (PF) 250 MCG/5ML IJ SOLN
INTRAMUSCULAR | Status: AC
  Filled 2021-11-18: qty 5

## 2021-11-18 MED ORDER — FENTANYL CITRATE (PF) 250 MCG/5ML IJ SOLN
INTRAMUSCULAR | Status: DC | PRN
  Administered 2021-11-18 (×5): 50 ug via INTRAVENOUS

## 2021-11-18 MED ORDER — PHENYLEPHRINE 80 MCG/ML (10ML) SYRINGE FOR IV PUSH (FOR BLOOD PRESSURE SUPPORT)
PREFILLED_SYRINGE | INTRAVENOUS | Status: DC | PRN
  Administered 2021-11-18: 80 ug via INTRAVENOUS
  Administered 2021-11-18: 160 ug via INTRAVENOUS
  Administered 2021-11-18: 80 ug via INTRAVENOUS

## 2021-11-18 MED ORDER — DIPHENHYDRAMINE HCL 50 MG/ML IJ SOLN
INTRAMUSCULAR | Status: DC | PRN
  Administered 2021-11-18: 12.5 mg via INTRAVENOUS

## 2021-11-18 MED ORDER — SODIUM CHLORIDE 0.9 % IV SOLN: INTRAVENOUS | Status: DC

## 2021-11-18 MED ORDER — ALBUMIN HUMAN 5 % IV SOLN: INTRAVENOUS | Status: DC | PRN

## 2021-11-18 MED ORDER — GLYCOPYRROLATE 0.2 MG/ML IJ SOLN
INTRAMUSCULAR | Status: DC | PRN
  Administered 2021-11-18: .2 mg via INTRAVENOUS

## 2021-11-18 MED ORDER — LIDOCAINE 2% (20 MG/ML) 5 ML SYRINGE
INTRAMUSCULAR | Status: DC | PRN
  Administered 2021-11-18: 40 mg via INTRAVENOUS

## 2021-11-18 MED ORDER — PROMETHAZINE HCL 25 MG/ML IJ SOLN: 6.2500 mg | INTRAMUSCULAR | Status: DC | PRN

## 2021-11-18 MED ORDER — ALUM & MAG HYDROXIDE-SIMETH 200-200-20 MG/5ML PO SUSP: 30.0000 mL | ORAL | Status: DC | PRN

## 2021-11-18 MED ORDER — STERILE WATER FOR IRRIGATION IR SOLN
Status: DC | PRN
  Administered 2021-11-18: 1000 mL

## 2021-11-18 MED ORDER — VANCOMYCIN HCL IN DEXTROSE 1-5 GM/200ML-% IV SOLN
1000.0000 mg | INTRAVENOUS | Status: AC
  Filled 2021-11-18: qty 200

## 2021-11-18 MED ORDER — CHLORHEXIDINE GLUCONATE 4 % EX LIQD: 60.0000 mL | Freq: Once | CUTANEOUS | Status: DC

## 2021-11-18 MED ORDER — METHOCARBAMOL 1000 MG/10ML IJ SOLN: 500.0000 mg | Freq: Four times a day (QID) | INTRAVENOUS | Status: DC | PRN

## 2021-11-18 MED ORDER — ONDANSETRON HCL 4 MG/2ML IJ SOLN: 4.0000 mg | Freq: Four times a day (QID) | INTRAMUSCULAR | Status: DC | PRN

## 2021-11-18 MED ORDER — PHENOL 1.4 % MT LIQD: 1.0000 | OROMUCOSAL | Status: DC | PRN

## 2021-11-18 MED ORDER — ONDANSETRON HCL 4 MG/2ML IJ SOLN
INTRAMUSCULAR | Status: AC
  Filled 2021-11-18: qty 2

## 2021-11-18 MED ORDER — FENTANYL CITRATE (PF) 100 MCG/2ML IJ SOLN
INTRAMUSCULAR | Status: AC
  Filled 2021-11-18: qty 2

## 2021-11-18 MED ORDER — OXYCODONE HCL 5 MG PO TABS
10.0000 mg | ORAL_TABLET | ORAL | Status: DC | PRN
  Administered 2021-11-19 (×2): 10 mg via ORAL
  Administered 2021-11-19: 15 mg via ORAL
  Administered 2021-11-19 – 2021-11-21 (×10): 10 mg via ORAL
  Filled 2021-11-18 (×2): qty 2
  Filled 2021-11-18: qty 3
  Filled 2021-11-18 (×6): qty 2

## 2021-11-18 MED ORDER — 0.9 % SODIUM CHLORIDE (POUR BTL) OPTIME
TOPICAL | Status: DC | PRN
  Administered 2021-11-18: 1000 mL

## 2021-11-18 MED ORDER — POVIDONE-IODINE 10 % EX SWAB
2.0000 | Freq: Once | CUTANEOUS | Status: AC
  Administered 2021-11-18: 2 via TOPICAL

## 2021-11-18 MED ORDER — DIPHENHYDRAMINE HCL 12.5 MG/5ML PO ELIX: 12.5000 mg | ORAL_SOLUTION | ORAL | Status: DC | PRN

## 2021-11-18 MED ORDER — ACETAMINOPHEN 500 MG PO TABS
ORAL_TABLET | ORAL | Status: AC
  Administered 2021-11-18: 1000 mg via ORAL
  Filled 2021-11-18: qty 2

## 2021-11-18 MED ORDER — PHENYLEPHRINE HCL-NACL 20-0.9 MG/250ML-% IV SOLN
INTRAVENOUS | Status: DC | PRN
  Administered 2021-11-18: 25 ug/min via INTRAVENOUS

## 2021-11-18 MED ORDER — BUPIVACAINE IN DEXTROSE 0.75-8.25 % IT SOLN
INTRATHECAL | Status: DC | PRN
  Administered 2021-11-18: 1.8 mL via INTRATHECAL

## 2021-11-18 MED ORDER — ARFORMOTEROL TARTRATE 15 MCG/2ML IN NEBU
15.0000 ug | INHALATION_SOLUTION | Freq: Two times a day (BID) | RESPIRATORY_TRACT | Status: DC
  Administered 2021-11-18 – 2021-11-21 (×6): 15 ug via RESPIRATORY_TRACT
  Filled 2021-11-18 (×7): qty 2

## 2021-11-18 MED ORDER — PROPOFOL 10 MG/ML IV BOLUS
INTRAVENOUS | Status: DC | PRN
  Administered 2021-11-18 (×4): 20 mg via INTRAVENOUS

## 2021-11-18 MED ORDER — ASPIRIN 81 MG PO CHEW
81.0000 mg | CHEWABLE_TABLET | Freq: Two times a day (BID) | ORAL | Status: DC
  Administered 2021-11-18 – 2021-11-21 (×6): 81 mg via ORAL
  Filled 2021-11-18 (×6): qty 1

## 2021-11-18 MED ORDER — REVEFENACIN 175 MCG/3ML IN SOLN
175.0000 ug | Freq: Every day | RESPIRATORY_TRACT | Status: DC
  Administered 2021-11-20 – 2021-11-21 (×2): 175 ug via RESPIRATORY_TRACT
  Filled 2021-11-18 (×4): qty 3

## 2021-11-18 MED ORDER — PROPOFOL 10 MG/ML IV BOLUS
INTRAVENOUS | Status: AC
  Filled 2021-11-18: qty 20

## 2021-11-18 MED ORDER — HYDROMORPHONE HCL 1 MG/ML IJ SOLN
0.5000 mg | INTRAMUSCULAR | Status: DC | PRN
  Administered 2021-11-18: 1 mg via INTRAVENOUS
  Filled 2021-11-18: qty 1

## 2021-11-18 MED ORDER — METHOCARBAMOL 500 MG PO TABS
500.0000 mg | ORAL_TABLET | Freq: Four times a day (QID) | ORAL | Status: DC | PRN
  Administered 2021-11-18 – 2021-11-20 (×7): 500 mg via ORAL
  Filled 2021-11-18 (×7): qty 1

## 2021-11-18 MED ORDER — EPHEDRINE SULFATE-NACL 50-0.9 MG/10ML-% IV SOSY
PREFILLED_SYRINGE | INTRAVENOUS | Status: DC | PRN
  Administered 2021-11-18: 5 mg via INTRAVENOUS

## 2021-11-18 MED ORDER — TRANEXAMIC ACID-NACL 1000-0.7 MG/100ML-% IV SOLN
INTRAVENOUS | Status: AC
  Filled 2021-11-18: qty 100

## 2021-11-18 MED ORDER — PANTOPRAZOLE SODIUM 40 MG PO TBEC
40.0000 mg | DELAYED_RELEASE_TABLET | Freq: Every day | ORAL | Status: DC
  Administered 2021-11-18 – 2021-11-21 (×4): 40 mg via ORAL
  Filled 2021-11-18 (×4): qty 1

## 2021-11-18 MED ORDER — DEXMEDETOMIDINE HCL IN NACL 80 MCG/20ML IV SOLN
INTRAVENOUS | Status: DC | PRN
  Administered 2021-11-18: 12 ug via BUCCAL

## 2021-11-18 MED ORDER — ORAL CARE MOUTH RINSE: 15.0000 mL | Freq: Once | OROMUCOSAL | Status: AC

## 2021-11-18 MED ORDER — LACTATED RINGERS IV SOLN: INTRAVENOUS | Status: DC | PRN

## 2021-11-18 SURGICAL SUPPLY — 47 items
ACETAB CUP W/GRIPTION 54 (Plate) ×1 IMPLANT
BAG COUNTER SPONGE SURGICOUNT (BAG) ×1 IMPLANT
BAG SPNG CNTER NS LX DISP (BAG) ×1
BALL HIP ARTICU EZE 36 8.5 (Hips) IMPLANT
BLADE SAW SGTL 18X1.27X75 (BLADE) ×1 IMPLANT
COVER SURGICAL LIGHT HANDLE (MISCELLANEOUS) ×1 IMPLANT
CUP ACETAB W/GRIPTION 54 (Plate) IMPLANT
DRAPE C-ARM 42X72 X-RAY (DRAPES) ×1 IMPLANT
DRAPE STERI IOBAN 125X83 (DRAPES) ×1 IMPLANT
DRAPE U-SHAPE 47X51 STRL (DRAPES) ×3 IMPLANT
DRSG AQUACEL AG ADV 3.5X10 (GAUZE/BANDAGES/DRESSINGS) ×1 IMPLANT
DURAPREP 26ML APPLICATOR (WOUND CARE) ×1 IMPLANT
ELECT BLADE 4.0 EZ CLEAN MEGAD (MISCELLANEOUS) ×1
ELECT REM PT RETURN 9FT ADLT (ELECTROSURGICAL) ×1
ELECTRODE BLDE 4.0 EZ CLN MEGD (MISCELLANEOUS) ×1 IMPLANT
ELECTRODE REM PT RTRN 9FT ADLT (ELECTROSURGICAL) ×1 IMPLANT
FACESHIELD WRAPAROUND (MASK) ×2 IMPLANT
FACESHIELD WRAPAROUND OR TEAM (MASK) ×2 IMPLANT
GLOVE BIOGEL PI IND STRL 8 (GLOVE) ×2 IMPLANT
GLOVE ECLIPSE 8.0 STRL XLNG CF (GLOVE) ×1 IMPLANT
GLOVE ORTHO TXT STRL SZ7.5 (GLOVE) ×2 IMPLANT
GOWN STRL REUS W/ TWL LRG LVL3 (GOWN DISPOSABLE) ×2 IMPLANT
GOWN STRL REUS W/ TWL XL LVL3 (GOWN DISPOSABLE) ×2 IMPLANT
GOWN STRL REUS W/TWL LRG LVL3 (GOWN DISPOSABLE) ×2
GOWN STRL REUS W/TWL XL LVL3 (GOWN DISPOSABLE) ×2
HANDPIECE INTERPULSE COAX TIP (DISPOSABLE) ×1
HIP BALL ARTICU EZE 36 8.5 (Hips) ×1 IMPLANT
KIT BASIN OR (CUSTOM PROCEDURE TRAY) ×1 IMPLANT
KIT TURNOVER KIT B (KITS) ×1 IMPLANT
LINER NEUTRAL 54X36MM PLUS 4 (Hips) IMPLANT
MANIFOLD NEPTUNE II (INSTRUMENTS) ×1 IMPLANT
NS IRRIG 1000ML POUR BTL (IV SOLUTION) ×1 IMPLANT
PACK TOTAL JOINT (CUSTOM PROCEDURE TRAY) ×1 IMPLANT
PAD ARMBOARD 7.5X6 YLW CONV (MISCELLANEOUS) ×1 IMPLANT
SET HNDPC FAN SPRY TIP SCT (DISPOSABLE) ×1 IMPLANT
STAPLER VISISTAT 35W (STAPLE) IMPLANT
STEM FEMORAL SZ6 HIGH ACTIS (Stem) IMPLANT
SUT ETHIBOND NAB CT1 #1 30IN (SUTURE) ×1 IMPLANT
SUT VIC AB 0 CT1 27 (SUTURE) ×1
SUT VIC AB 0 CT1 27XBRD ANBCTR (SUTURE) ×1 IMPLANT
SUT VIC AB 1 CT1 27 (SUTURE) ×1
SUT VIC AB 1 CT1 27XBRD ANBCTR (SUTURE) ×1 IMPLANT
SUT VIC AB 2-0 CT1 27 (SUTURE) ×1
SUT VIC AB 2-0 CT1 TAPERPNT 27 (SUTURE) ×1 IMPLANT
TOWEL GREEN STERILE (TOWEL DISPOSABLE) ×1 IMPLANT
TOWEL GREEN STERILE FF (TOWEL DISPOSABLE) ×1 IMPLANT
WATER STERILE IRR 1000ML POUR (IV SOLUTION) ×2 IMPLANT

## 2021-11-18 NOTE — Progress Notes (Signed)
[facility] Progress Note  Patient: Nicholas [name]     DXI:[id number]  DOA: 11/16/2021   PCP: [place] Nation, MD       Brief hospital course: This is a 71 year old male with COPD who continues to smoke, HIV, coronary artery disease status post stenting, CKD stage III and hypertension who presented to [facility] after a fall. In the ED, he was found to have a right hip fracture.  He became hypoxic and required 6 L of oxygen.  CT angiogram was performed and was negative for PE but was concerning for bronchitis.  He was started on IV steroids and antibiotics and transferred to [facility]. Chest x-ray reveals bilateral opacities hyperinflation.  The patient was given IV Lasix times once.    Subjective:   Right hip pain. Cough with yellow sputum.   Assessment and Plan: Principal Problem:   Closed right hip fracture ([place]) - appreciate ortho eval-  THA today  Active Problems:   Acute respiratory failure with hypoxia ([facility])- h/o COPD - appreciate PCCM eval - has been weaned down to 2 l   - COVID, RSV, flu negative - ECHO > normal EF and grade 1 diastolic dysfunction - cont Prednisone and Doxy  Thrombocytopenia - acute- follow    HIV disease ([place]) - cont Biktarvy  HTN - cont Amlodipine, Losartan, Carvedilol   Cigarette smoker - smokes 1ppd- cont Nicoderm patch       Code Status: Full Code DVT prophylaxis:    Consultants: PCCM, Ortho Level of Care: Level of care: Progressive  Objective:   Vitals:   11/17/21 1948 11/17/21 1949 11/17/21 1950 11/17/21 2015  BP:   114/61   Pulse: 80 82 80   Resp: '19 18 19   '$ Temp:   98.4 F (36.9 C)   TempSrc:   Oral   SpO2: 91% 90% 90% 93%  Weight:      Height:       Filed Weights   11/17/21 0057  Weight: 91 kg   Exam: General exam: Appears comfortable  HEENT: oral mucosa moist Respiratory system: Clear to auscultation.  Cardiovascular system: S1 & S2 heard  Gastrointestinal system: Abdomen soft,  non-tender, nondistended. Normal bowel sounds   Extremities: No cyanosis, clubbing or edema Psychiatry:  Mood & affect appropriate.    Imaging and lab data was personally reviewed    CBC: Recent Labs  Lab 11/16/21 2025 11/16/21 2227  WBC 14.5* 13.9*  NEUTROABS 12.4*  --   HGB 15.9 16.1  HCT 45.4 45.4  MCV 99.6 99.6  PLT 145* 137*    Basic Metabolic Panel: Recent Labs  Lab 11/16/21 2025 11/16/21 2227  NA 136 136  K 5.0 4.3  CL 103 104  CO2 27 23  GLUCOSE 121* 121*  BUN 29* 29*  CREATININE 1.28* 1.20  CALCIUM 9.1 8.9    GFR: Estimated Creatinine Clearance: 64.6 mL/min (by C-G formula based on SCr of 1.2 mg/dL).  Scheduled Meds:  amLODipine  5 mg Oral Daily   atorvastatin  20 mg Oral Daily   bictegravir-emtricitabine-tenofovir AF  1 tablet Oral Daily   budesonide (PULMICORT) nebulizer solution  0.25 mg Nebulization BID   carvedilol  25 mg Oral BID   doxycycline  100 mg Oral Q12H   ipratropium-albuterol  3 mL Nebulization BID   losartan  25 mg Oral Daily   nicotine  21 mg Transdermal Daily   predniSONE  40 mg Oral Q breakfast   Continuous Infusions:   LOS:  2 days   Author: Debbe Odea  11/18/2021 7:53 AM

## 2021-11-18 NOTE — Op Note (Signed)
Operative Note  Date of operation: 11/18/2021 Preoperative diagnosis: Displaced right hip femoral neck fracture Postoperative diagnosis: Same  Procedure: Right direct anterior total hip arthroplasty  Implants: DePuy sector GRIPTION acetabular opponent size 54, size 36+4 polythene liner, size 6 Actis femoral component with high offset, size 36+8.5 metal hip ball  Surgeon: Lind Guest. Ninfa Linden, MD Assistant: Benita Stabile, PA-C  Anesthesia: Spinal EBL: 200 to 300 cc Antibiotics: IV vancomycin Complications: None  Indications: The patient is a 71 year old active gentleman who sustained a mechanical fall earlier this week injuring his right hip.  He sustained a displaced femoral neck fracture.  He was transferred from an outside hospital to [facility] due to significant comorbidities.  He now presents for definitive treatment of his right hip.  I spoken to him in length in detail.  This is displaced femoral neck fracture and given his level of activity and age we recommended a total hip arthroplasty.  The risks and benefits of surgery were explained in detail and informed consent was obtained.  Procedure description: After informed consent was obtained and the appropriate right hip was marked, the patient was brought to the operating room and rolled to his side in the stretcher where spinal anesthesia was obtained.  He was then laid in a supine position on the stretcher and traction boots were placed on both his feet.  He already had a Foley catheter in place.  He was then placed supine on the [place] fracture table with a perineal post in place in both legs and inline skeletal traction devices but no traction applied.  His right operative hip was prepped and draped in DuraPrep and sterile drapes.  Timeout was called and he was defined the correct patient correct right hip.  We then made an incision just inferior and posterior to the ASIS and brought this slightly obliquely down the leg.   Dissection was carried down to the tensor fascia lata muscle and the tensor fascia was then divided longitudinally to proceed with a direct intraoperative to the hip.  Circumflex vessels were identified and cauterized.  The hip capsule identified and cover retractors were placed around the joint capsule.  A capsulotomy was then made and a large hemarthrosis was encountered consistent with a femoral neck fracture and we could see the displaced femoral neck fracture.  We then used a oscillating saw to make a freshening cut just distal to the fracture proximal to the lesser trochanter.  It was on the level lesser trochanter.  We then placed a corkscrew guide the femoral head and remove the femoral heads entirety.  There is no significant cartilage wear at all given this is acute fracture with no previous hip disease.  We then removed the acetabular labrum and other debris and then began reaming under direct visualization from a size 43 reamer and separate segments clinic for size 53 reamer with all reamers placed under direct visualization in the last reamer was placed under direct fluoroscopy so we could obtain our depth reaming, our inclination and our anteversion.  We then placed the real DePuy sector GRIPTION acetabular opponent size 54 and a 36+4 polythene liner for that size 54 acetabular component.  Attention was then turned to the femur.  With the leg externally rotated to 120 degrees, extended, and adducted, and medial retractor was placed medially and a Hohmann retractor behind the greater trochanter.  The lateral joint capsule was released.  A box cutting osteotome was used to enter the femoral canal and then  began broaching using the Actis broaching system from a size 0 going to a size 6.  With a size 6 and placed we trialed a standard offset femoral neck and a 36+1.5 trial hip ball.  This was released in the pelvis and from radiographic assessment and clinical assessment we needed more offset and leg  length.  We removed the trial components and then placed the real Actis femoral component size 6 but we will with a high offset femoral component.  We then went with a 36+8.5 metal head ball.  We reduced this and acetabular pleased with leg length range of motion offset and stability assessed both mechanically and radiographically.  The joint was then irrigated with normal saline solution.  The joint capsule was closed with interrupted #1 Ethibond suture followed by #1 Vicryl to close the tensor fascia.  0 Vicryl used to close the deep tissue and 2-0 Vicryl is used to close the subcutaneous tissue.  The skin was closed with staples.  Aquacel dressing was applied.  The patient was taken off of the operating table and taken to the recovery room in stable addition with all final counts being correct and no complications noted.  Of note Benita Stabile, PA-C did assist during the entire case from the beginning into the end and his assistance was medically necessary and warranted for soft tissue retraction, helping guide implant placement and a layered closure of the wound.

## 2021-11-18 NOTE — Progress Notes (Signed)
Patient ID: Nicholas [name], male   DOB: 04/16/1950, 71 y.o.   MRN: [id number] I have seen and examined the patient this afternoon.  He understands that our recommendation is for a right total hip arthroplasty to treat his acute and displaced right hip femoral neck fracture.  The risks and benefits of surgery been explained in detail and informed consent is obtained.  The right operative hip has been marked.  His vital signs thus far today are stable.

## 2021-11-18 NOTE — Consult Note (Signed)
   NAME:  Nicholas [name], MRN:  [id number], DOB:  Jul 11, 1950, LOS: 2 ADMISSION DATE:  11/16/2021, CONSULTATION DATE:  11/17/21 REFERRING MD:  TRH CHIEF COMPLAINT:  Acute Hypoxia Respiratory Failure   History of Present Illness:  Nicholas [name] is a 71 y.o. male with history of CAD status post stenting, COPD, HIV, chronic kidney disease stage III, hypertension had presented to the ER at [facility] with a [facility].  Patient denies hitting his head or losing consciousness.  He states his legs gave way and he fell. Pt had planned hip surgery but has been becoming hypoxic and PCCM consulted for hypoxia. Workup so far includes Negative Covid/Flu/RSV, CTA for PE negative but did show pulmonary nodules some which are unchanged from prior and some new ones. CBC showing leukocytosis that is improving and CXR showing bilateral opacities but negative for effusion or pneumothorax. CXR also shows hyperinflation of the lungs. Troponin negative and BNP elevated 313.9. Pt did receive 20 mg IV lasix this am.   Pertinent  Medical History  CAD status post stenting, COPD, HIV, chronic kidney disease stage III, hypertension  Interim History / Subjective:  Breathing comfortably. No complaints. On 2L  Objective   Blood pressure 110/75, pulse 61, temperature 97.6 F (36.4 C), temperature source Oral, resp. rate 16, height 5' 10.5" (1.791 m), weight 91 kg, SpO2 92 %.    FiO2 (%):  [36 %] 36 %   Intake/Output Summary (Last 24 hours) at 11/18/2021 1342 Last data filed at 11/18/2021 1340 Gross per 24 hour  Intake 240 ml  Output 1725 ml  Net -1485 ml    Filed Weights   11/17/21 0057  Weight: 91 kg   Examination: General: Elderly appearing male in NAD HENT: [place]/AT, PERRL, no JVD Lungs: Coarse. Wheeze improved.  Cardiovascular: RRR, no MRG.  Abdomen: Soft, NT, ND Extremities: No acute deformity or ROM limitation. No edema.  Neuro: alert, oriented, non-focal  Assessment & Plan:  Acute hypoxic Respiratory Failure: Likely  multifactorial, and probably at least some of this is chronic. His CT chest from [facility] shows quite significant emphysema. Likely has COPD exacerbation based on wheeze. BNP elevated and he did receive low dose lasix. Echocardiogram with normal LVEF and grade 1 diastolic dysfunction. Less likely there could be fat embolism considering his femur fracture, which would have to be a diagnosis of exclusion.  -Wean oxygen as able for sats 90-95%, wouldn't be shocked if he ended up going home on oxygen, but we should be able to wean off.  -doxycycline 100 mg BID for 5 day course -Prednisone '40mg'$  for 5 day course -Budesonide, brovana, yupelri -Change back to stiolto when ready for discharge -OK to diurese as BP and renal function will tolerate. BNP did improve with diuresis.  -Smoking cessation  Will follow up with him tomorrow after surgery.   Best Practice (right click and "Reselect all SmartList Selections" daily)  Diet/type: NPO DVT prophylaxis: SCD GI prophylaxis: N/A Lines: N/A Foley:  N/A Continuous: None  Code Status:  full code   Georgann Housekeeper, AGACNP-BC [place] for personal pager PCCM on call pager [phone] until 7pm. Please call Elink 7p-7a. [phone]  11/18/2021 2:26 PM

## 2021-11-18 NOTE — Anesthesia Procedure Notes (Signed)
Spinal  Patient location during procedure: OR Start time: 11/18/2021 2:49 PM End time: 11/18/2021 2:59 PM Reason for block: surgical anesthesia Staffing Performed: anesthesiologist  Anesthesiologist: Duane Boston, MD Performed by: Duane Boston, MD Authorized by: Duane Boston, MD   Preanesthetic Checklist Completed: patient identified, IV checked, risks and benefits discussed, surgical consent, monitors and equipment checked, pre-op evaluation and timeout performed Spinal Block Patient position: sitting Prep: DuraPrep Patient monitoring: cardiac monitor, continuous pulse ox and blood pressure Approach: midline Location: L2-3 Injection technique: single-shot Needle Needle type: Pencan  Needle gauge: 24 G Needle length: 9 cm Assessment Events: CSF return Additional Notes Functioning IV was confirmed and monitors were applied. Sterile prep and drape, including hand hygiene and sterile gloves were used. The patient was positioned and the spine was prepped. The skin was anesthetized with lidocaine.  Free flow of clear CSF was obtained prior to injecting local anesthetic into the CSF.  The spinal needle aspirated freely following injection.  The needle was carefully withdrawn.  The patient tolerated the procedure well.

## 2021-11-18 NOTE — Care Management Important Message (Signed)
Important Message  Patient Details  Name: Nicholas [name] MRN: [id number] Date of Birth: 01-03-1951   Medicare Important Message Given:  Yes     Hannah Beat 11/18/2021, 3:42 PM

## 2021-11-18 NOTE — Anesthesia Preprocedure Evaluation (Addendum)
Anesthesia Evaluation  Patient identified by MRN, date of birth, ID band Patient awake    Reviewed: Allergy & Precautions, NPO status , Patient's Chart, lab work & pertinent test results  History of Anesthesia Complications Negative for: history of anesthetic complications  Airway Mallampati: I  TM Distance: >3 FB Neck ROM: Full    Dental  (+) Edentulous Upper, Edentulous Lower, Dental Advisory Given   Pulmonary COPD,  COPD inhaler, Current Smoker and Patient abstained from smoking.,    breath sounds clear to auscultation       Cardiovascular hypertension, Pt. on medications and Pt. on home beta blockers + CAD, + Past MI and + Cardiac Stents   Rhythm:regular Rate:Normal  Echo 3/21 IMPRESSIONS    1. Challenging windows. Left ventricular ejection fraction, by  estimation, is 60 to 65%. The left ventricle has normal function. The left  ventricle has no regional wall motion abnormalities. Left ventricular  diastolic parameters are consistent with  Grade I diastolic dysfunction (impaired relaxation).  2. Right ventricular systolic function is normal. The right ventricular  size is normal.  3. The mitral valve is grossly normal. No evidence of mitral valve  regurgitation.  4. The aortic valve was not well visualized. Aortic valve regurgitation  is not visualized.  5. The inferior vena cava is normal in size with greater than 50%  respiratory variability, suggesting right atrial pressure of 3 mmHg.   Comparison(s): No significant change from prior study.   Cath 2019  ? Dist RCA lesion is 85% stenosed. ? Prox RCA lesion is 20% stenosed. ? Mid RCA lesion is 95% stenosed. ? Post intervention, there is a 0% residual stenosis. ? Post intervention, there is a 0% residual stenosis. ? A stent was successfully placed. ? A stent was successfully placed.   Very difficult but ultimately successful PCI to the 95% mid RCA stenosis  and the distal 85% RCA stenosis which appeared more significant since the initial evaluation.  A 2.25 x 15 mm Xience Sierra DES stent was inserted distally with post stent dilatation up to 2.6 mm.  A 2.5 x 15 mm Xience Sierra stent was inserted after much difficulty into the mid site with ultimate post stent dilatation up to 2.78 mm.  Post stent stenoses were reduced to 0%.  LVEDP 11 mmHg.    Neuro/Psych PSYCHIATRIC DISORDERS Depression CVA    GI/Hepatic negative GI ROS, Neg liver ROS,   Endo/Other  Hyperthyroidism   Renal/GU Renal InsufficiencyRenal disease     Musculoskeletal negative musculoskeletal ROS (+)   Abdominal   Peds  Hematology  (+) HIV,   Anesthesia Other Findings No plavix x 4 days: normal TEG.  OK for SAB  Reproductive/Obstetrics                           Anesthesia Physical Anesthesia Plan  ASA: 3  Anesthesia Plan: Spinal and MAC   Post-op Pain Management: Tylenol PO (pre-op)*   Induction:   PONV Risk Score and Plan: Ondansetron and Propofol infusion  Airway Management Planned: Natural Airway  Additional Equipment:   Intra-op Plan:   Post-operative Plan:   Informed Consent:   Plan Discussed with: Anesthesiologist and CRNA  Anesthesia Plan Comments:        Anesthesia Quick Evaluation

## 2021-11-18 NOTE — Anesthesia Procedure Notes (Signed)
Procedure Name: MAC Date/Time: 11/18/2021 2:47 PM  Performed by: Mariea Clonts, CRNAPre-anesthesia Checklist: Patient identified, Emergency Drugs available, Suction available, Patient being monitored and Timeout performed Patient Re-evaluated:Patient Re-evaluated prior to induction Oxygen Delivery Method: Simple face mask

## 2021-11-18 NOTE — Discharge Instructions (Signed)
Take 21 mg nicoderm patch for 1-2 wks. Reduce to 14 mg for 1-2 wks and then to 7 mg for 1-2 wks and then stop.  Please review all of you discharge paperwork on the day of discharge and be sure you have all of your prescribed medications.  Please request your Primary MD to go over all Hospital Tests and Procedure/Radiological results at the follow up Please get all Hospital records sent to your primary MD by signing hospital release before you go home.   In some cases, there will be blood work, cultures and biopsy results pending at the time of your discharge. Please request that your primary care M.D. goes through all the records of your hospital data and follows up on these results.  Please take all your medications with you for your next visit with your Primary MD   Please request your Primary MD to go over all hospital tests and procedure/radiological results at the follow up, please ask your Primary MD to get all Hospital records sent to his/her office.   You must read complete instructions/literature along with all the possible adverse reactions/side effects for all the Medicines you take and that have been prescribed to you. Take any new Medicines after you have completely understood and accpet all the possible adverse reactions/side effects.    Do not drive or operate heavy machinery when taking Pain medications.    Do not take more than prescribed Pain, Sleep and Anxiety Medications  If you have smoked or chewed Tobacco  in the last 2 yrs please stop smoking, stop any regular Alcohol  and or any Recreational drug use.   Wear Seat belts while driving.   If you had Pneumonia or Lung problems at the Hospital: Please get a 2 view Chest X ray done in 6-8 weeks after hospital discharge or sooner if instructed by your Primary MD.   If you have Congestive Heart Failure: Please call your Cardiologist or Primary MD anytime you have any of the following symptoms:  1) 3 pound weight gain in  24 hours or 5 pounds in 1 week  2) shortness of breath, with or without a dry hacking cough  3) swelling in the hands, feet or stomach  4) if you have to sleep on extra pillows at night in order to breathe 5) Follow cardiac low salt diet and 1.5 lit/day fluid restriction.   If you have Diabetes Accuchecks 4 times/day- once on AM empty stomach and then before each meal. Log in all results and show them to your primary doctor at your next visit. If any glucose reading is under 60 or above 400 call your primary MD immediately.   If you have Seizure/Convulsions/Epilepsy: Please do not drive, operate heavy machinery, participate in activities at heights or participate in high speed sports until you have seen by Primary MD or a Neurologist and advised to do so again. Per [facility] statutes, patients with seizures are not allowed to drive until they have been seizure-free for six months.  Use caution when using heavy equipment or power tools. Avoid working on ladders or at heights. Take showers instead of baths. Ensure the water temperature is not too high on the home water heater. Do not go swimming alone. Do not lock yourself in a room alone (i.e. bathroom). When caring for infants or small children, sit down when holding, feeding, or changing them to minimize risk of injury to the child in the event you have a seizure. Maintain  good sleep hygiene. Avoid alcohol.    If you had Gastrointestinal Bleeding: Please ask your Primary MD to check a complete blood count within one week of discharge or at your next visit. Your endoscopic/colonoscopic biopsies that are pending at the time of discharge, will also need to followed by your Primary MD.  Please note You were cared for by a hospitalist during your hospital stay. If you have any questions about your discharge medications or the care you received while you were in the hospital after you are discharged, you can call the unit and asked to speak  with the hospitalist on call if the hospitalist that took care of you is not available. Once you are discharged, your primary care physician will handle any further medical issues. Please note that NO REFILLS for any discharge medications will be authorized once you are discharged, as it is imperative that you return to your primary care physician (or establish a relationship with a primary care physician if you do not have one) for your aftercare needs so that they can reassess your need for medications and monitor your lab values.   You can reach the hospitalist office at phone [phone] or fax [phone]   If you do not have a primary care physician, you can call [phone] for a physician referral.      INSTRUCTIONS AFTER JOINT REPLACEMENT   Remove items at home which could result in a fall. This includes throw rugs or furniture in walking pathways ICE to the affected joint every three hours while awake for 30 minutes at a time, for at least the first 3-5 days, and then as needed for pain and swelling.  Continue to use ice for pain and swelling. You may notice swelling that will progress down to the foot and ankle.  This is normal after surgery.  Elevate your leg when you are not up walking on it.   Continue to use the breathing machine you got in the hospital (incentive spirometer) which will help keep your temperature down.  It is common for your temperature to cycle up and down following surgery, especially at night when you are not up moving around and exerting yourself.  The breathing machine keeps your lungs expanded and your temperature down.   DIET:  As you were doing prior to hospitalization, we recommend a well-balanced diet.  DRESSING / WOUND CARE / SHOWERING  Keep the surgical dressing until follow up.  The dressing is water proof, so you can shower without any extra covering.  IF THE DRESSING FALLS OFF or the wound gets wet inside, change the dressing with sterile gauze.   Please use good hand washing techniques before changing the dressing.  Do not use any lotions or creams on the incision until instructed by your surgeon.    ACTIVITY  Increase activity slowly as tolerated, but follow the weight bearing instructions below.   No driving for 6 weeks or until further direction given by your physician.  You cannot drive while taking narcotics.  No lifting or carrying greater than 10 lbs. until further directed by your surgeon. Avoid periods of inactivity such as sitting longer than an hour when not asleep. This helps prevent blood clots.  You may return to work once you are authorized by your doctor.     WEIGHT BEARING   Weight bearing as tolerated with assist device (walker, cane, etc) as directed, use it as long as suggested by your surgeon or therapist, typically at least 4-6  weeks.   EXERCISES  Results after joint replacement surgery are often greatly improved when you follow the exercise, range of motion and muscle strengthening exercises prescribed by your doctor. Safety measures are also important to protect the joint from further injury. Any time any of these exercises cause you to have increased pain or swelling, decrease what you are doing until you are comfortable again and then slowly increase them. If you have problems or questions, call your caregiver or physical therapist for advice.   Rehabilitation is important following a joint replacement. After just a few days of immobilization, the muscles of the leg can become weakened and shrink (atrophy).  These exercises are designed to build up the tone and strength of the thigh and leg muscles and to improve motion. Often times heat used for twenty to thirty minutes before working out will loosen up your tissues and help with improving the range of motion but do not use heat for the first two weeks following surgery (sometimes heat can increase post-operative swelling).   These exercises can be done on a  training (exercise) mat, on the floor, on a table or on a bed. Use whatever works the best and is most comfortable for you.    Use music or television while you are exercising so that the exercises are a pleasant break in your day. This will make your life better with the exercises acting as a break in your routine that you can look forward to.   Perform all exercises about fifteen times, three times per day or as directed.  You should exercise both the operative leg and the other leg as well.  Exercises include:   Quad Sets - Tighten up the muscle on the front of the thigh (Quad) and hold for 5-10 seconds.   Straight Leg Raises - With your knee straight (if you were given a brace, keep it on), lift the leg to 60 degrees, hold for 3 seconds, and slowly lower the leg.  Perform this exercise against resistance later as your leg gets stronger.  Leg Slides: Lying on your back, slowly slide your foot toward your buttocks, bending your knee up off the floor (only go as far as is comfortable). Then slowly slide your foot back down until your leg is flat on the floor again.  Angel Wings: Lying on your back spread your legs to the side as far apart as you can without causing discomfort.  Hamstring Strength:  Lying on your back, push your heel against the floor with your leg straight by tightening up the muscles of your buttocks.  Repeat, but this time bend your knee to a comfortable angle, and push your heel against the floor.  You may put a pillow under the heel to make it more comfortable if necessary.   A rehabilitation program following joint replacement surgery can speed recovery and prevent re-injury in the future due to weakened muscles. Contact your doctor or a physical therapist for more information on knee rehabilitation.    CONSTIPATION  Constipation is defined medically as fewer than three stools per week and severe constipation as less than one stool per week.  Even if you have a regular bowel  pattern at home, your normal regimen is likely to be disrupted due to multiple reasons following surgery.  Combination of anesthesia, postoperative narcotics, change in appetite and fluid intake all can affect your bowels.   YOU MUST use at least one of the following options; they are listed  in order of increasing strength to get the job done.  They are all available over the counter, and you may need to use some, POSSIBLY even all of these options:    Drink plenty of fluids (prune juice may be helpful) and high fiber foods Colace 100 mg by mouth twice a day  Senokot for constipation as directed and as needed Dulcolax (bisacodyl), take with full glass of water  Miralax (polyethylene glycol) once or twice a day as needed.  If you have tried all these things and are unable to have a bowel movement in the first 3-4 days after surgery call either your surgeon or your primary doctor.    If you experience loose stools or diarrhea, hold the medications until you stool forms back up.  If your symptoms do not get better within 1 week or if they get worse, check with your doctor.  If you experience "the worst abdominal pain ever" or develop nausea or vomiting, please contact the office immediately for further recommendations for treatment.   ITCHING:  If you experience itching with your medications, try taking only a single pain pill, or even half a pain pill at a time.  You can also use Benadryl over the counter for itching or also to help with sleep.   TED HOSE STOCKINGS:  Use stockings on both legs until for at least 2 weeks or as directed by physician office. They may be removed at night for sleeping.  MEDICATIONS:  See your medication summary on the "After Visit Summary" that nursing will review with you.  You may have some home medications which will be placed on hold until you complete the course of blood thinner medication.  It is important for you to complete the blood thinner medication as  prescribed.  PRECAUTIONS:  If you experience chest pain or shortness of breath - call 911 immediately for transfer to the hospital emergency department.   If you develop a fever greater that 101 F, purulent drainage from wound, increased redness or drainage from wound, foul odor from the wound/dressing, or calf pain - CONTACT YOUR SURGEON.                                                   FOLLOW-UP APPOINTMENTS:  If you do not already have a post-op appointment, please call the office for an appointment to be seen by your surgeon.  Guidelines for how soon to be seen are listed in your "After Visit Summary", but are typically between 1-4 weeks after surgery.  OTHER INSTRUCTIONS:   Knee Replacement:  Do not place pillow under knee, focus on keeping the knee straight while resting. CPM instructions: 0-90 degrees, 2 hours in the morning, 2 hours in the afternoon, and 2 hours in the evening. Place foam block, curve side up under heel at all times except when in CPM or when walking.  DO NOT modify, tear, cut, or change the foam block in any way.  POST-OPERATIVE OPIOID TAPER INSTRUCTIONS: It is important to wean off of your opioid medication as soon as possible. If you do not need pain medication after your surgery it is ok to stop day one. Opioids include: Codeine, Hydrocodone(Norco, Vicodin), Oxycodone(Percocet, oxycontin) and hydromorphone amongst others.  Long term and even short term use of opiods can cause: Increased pain response Dependence Constipation Depression  Respiratory depression And more.  Withdrawal symptoms can include Flu like symptoms Nausea, vomiting And more Techniques to manage these symptoms Hydrate well Eat regular healthy meals Stay active Use relaxation techniques(deep breathing, meditating, yoga) Do Not substitute Alcohol to help with tapering If you have been on opioids for less than two weeks and do not have pain than it is ok to stop all together.  Plan to  wean off of opioids This plan should start within one week post op of your joint replacement. Maintain the same interval or time between taking each dose and first decrease the dose.  Cut the total daily intake of opioids by one tablet each day Next start to increase the time between doses. The last dose that should be eliminated is the evening dose.   MAKE SURE YOU:  Understand these instructions.  Get help right away if you are not doing well or get worse.    Thank you for letting us be a part of your medical care team.  It is a privilege we respect greatly.  We hope these instructions will help you stay on track for a fast and full recovery!

## 2021-11-18 NOTE — Transfer of Care (Signed)
Immediate Anesthesia Transfer of Care Note  Patient: Nicholas [name]  Procedure(s) Performed: TOTAL HIP ARTHROPLASTY ANTERIOR APPROACH (Right: Hip)  Patient Location: PACU  Anesthesia Type:Spinal  Level of Consciousness: awake, alert  and oriented  Airway & Oxygen Therapy: Patient Spontanous Breathing and Patient connected to face mask oxygen  Post-op Assessment: Report given to RN and Post -op Vital signs reviewed and stable  Post vital signs: Reviewed and stable  Last Vitals:  Vitals Value Taken Time  BP 92/64 11/18/21 1637  Temp    Pulse 71 11/18/21 1641  Resp 17 11/18/21 1641  SpO2 93 % 11/18/21 1641  Vitals shown include unvalidated device data.  Last Pain:  Vitals:   11/18/21 1428  TempSrc:   PainSc: 8       Patients Stated Pain Goal: 1 (16/10/96 0454)  Complications: No notable events documented.

## 2021-11-19 DIAGNOSIS — S72001A Fracture of unspecified part of neck of right femur, initial encounter for closed fracture: Secondary | ICD-10-CM | POA: Diagnosis not present

## 2021-11-19 DIAGNOSIS — J9601 Acute respiratory failure with hypoxia: Secondary | ICD-10-CM | POA: Diagnosis not present

## 2021-11-19 LAB — BASIC METABOLIC PANEL
Anion gap: 9 (ref 5–15)
BUN: 40 mg/dL — ABNORMAL HIGH (ref 8–23)
CO2: 27 mmol/L (ref 22–32)
Calcium: 9.2 mg/dL (ref 8.9–10.3)
Chloride: 103 mmol/L (ref 98–111)
Creatinine, Ser: 1.37 mg/dL — ABNORMAL HIGH (ref 0.61–1.24)
GFR, Estimated: 55 mL/min — ABNORMAL LOW (ref >60)
Glucose, Bld: 125 mg/dL — ABNORMAL HIGH (ref 70–99)
Potassium: 3.5 mmol/L (ref 3.5–5.1)
Sodium: 139 mmol/L (ref 135–145)

## 2021-11-19 LAB — CBC
HCT: 41.5 % (ref 39.0–52.0)
Hemoglobin: 14.7 g/dL (ref 13.0–17.0)
MCH: 34.8 pg — ABNORMAL HIGH (ref 26.0–34.0)
MCHC: 35.4 g/dL (ref 30.0–36.0)
MCV: 98.3 fL (ref 80.0–100.0)
Platelets: 133 10*3/uL — ABNORMAL LOW (ref 150–400)
RBC: 4.22 MIL/uL (ref 4.22–5.81)
RDW: 12.9 % (ref 11.5–15.5)
WBC: 9 10*3/uL (ref 4.0–10.5)
nRBC: 0 % (ref 0.0–0.2)

## 2021-11-19 MED ORDER — PNEUMOCOCCAL 20-VAL CONJ VACC 0.5 ML IM SUSY
0.5000 mL | PREFILLED_SYRINGE | INTRAMUSCULAR | Status: AC
  Administered 2021-11-20: 0.5 mL via INTRAMUSCULAR
  Filled 2021-11-19: qty 0.5

## 2021-11-19 MED ORDER — CHLORHEXIDINE GLUCONATE CLOTH 2 % EX PADS
6.0000 | MEDICATED_PAD | Freq: Every day | CUTANEOUS | Status: DC
  Administered 2021-11-19 – 2021-11-21 (×3): 6 via TOPICAL

## 2021-11-19 NOTE — Progress Notes (Signed)
Foley catheter removed, pt due to void. 

## 2021-11-19 NOTE — Anesthesia Postprocedure Evaluation (Signed)
Anesthesia Post Note  Patient: [name]  Procedure(s) Performed: TOTAL HIP ARTHROPLASTY ANTERIOR APPROACH (Right: Hip)     Patient location during evaluation: PACU Anesthesia Type: MAC and Spinal Level of consciousness: oriented and awake and alert Pain management: pain level controlled Vital Signs Assessment: post-procedure vital signs reviewed and stable Respiratory status: spontaneous breathing, respiratory function stable and patient connected to nasal cannula oxygen Cardiovascular status: blood pressure returned to baseline and stable Postop Assessment: no headache, no backache and no apparent nausea or vomiting Anesthetic complications: no   No notable events documented.  Last Vitals:  Vitals:   11/19/21 0848 11/19/21 0903  BP:  (!) 141/64  Pulse:  61  Resp:  19  Temp:  36.5 C  SpO2: 92% 92%    Last Pain:  Vitals:   11/19/21 1140  TempSrc:   PainSc: 10-Worst pain ever                 Triston Lisanti S

## 2021-11-19 NOTE — Progress Notes (Signed)
[facility] Progress Note  Patient: Nicholas [name]     JSH:[id number]  DOA: 11/16/2021   PCP: [place], MD       Brief hospital course: This is a 71 year old male with COPD who continues to smoke, HIV, coronary artery disease status post stenting, CKD stage III and hypertension who presented to [facility] after a fall. In the ED, he was found to have a right hip fracture.  He became hypoxic and required 6 L of oxygen.  CT angiogram was performed and was negative for PE but was concerning for bronchitis.  He was started on IV steroids and antibiotics and transferred to [facility]. Chest x-ray reveals bilateral opacities hyperinflation.  The patient was given IV Lasix times once.    Subjective:  Cough is improving.   Assessment and Plan: Principal Problem:   Closed right hip fracture ([place]) - appreciate ortho eval - 9/29> THA - ASA 81 mg BID for DVT prophylaxis - WBAT with walker  Active Problems:   Acute respiratory failure with hypoxia ([place])- h/o COPD - appreciate PCCM eval - has been weaned down to 2 l   - COVID, RSV, flu negative - ECHO > normal EF and grade 1 diastolic dysfunction - cont Prednisone and Doxy  Thrombocytopenia - acute- follow    HIV disease ([place]) - cont Biktarvy  Rising Cr - hold Losartan  HTN - cont Amlodipine & Carvedilol - holding Losartan   Cigarette smoker - smokes 1ppd- cont Nicoderm patch       Code Status: Full Code DVT prophylaxis:    Consultants: PCCM, Ortho Level of Care: Level of care: Med-Surg  Objective:   Vitals:   11/19/21 0239 11/19/21 0439 11/19/21 0848 11/19/21 0903  BP: 135/61 (!) 128/59  (!) 141/64  Pulse: (!) 59 (!) 53  61  Resp: '16 15  19  '$ Temp: 98 F (36.7 C)   97.7 F (36.5 C)  TempSrc: Oral   Oral  SpO2: 95% (!) 88% 92% 92%  Weight:      Height:       Filed Weights   11/17/21 0057  Weight: 91 kg   Exam: General exam: Appears comfortable  HEENT: oral mucosa  moist Respiratory system: Clear to auscultation.  Cardiovascular system: S1 & S2 heard  Gastrointestinal system: Abdomen soft, non-tender, nondistended. Normal bowel sounds   Extremities: No cyanosis, clubbing or edema Psychiatry:  Mood & affect appropriate.    Imaging and lab data was personally reviewed    CBC: Recent Labs  Lab 11/16/21 2025 11/16/21 2227 11/19/21 0309  WBC 14.5* 13.9* 9.0  NEUTROABS 12.4*  --   --   HGB 15.9 16.1 14.7  HCT 45.4 45.4 41.5  MCV 99.6 99.6 98.3  PLT 145* 137* 133*    Basic Metabolic Panel: Recent Labs  Lab 11/16/21 2025 11/16/21 2227 11/19/21 0309  NA 136 136 139  K 5.0 4.3 3.5  CL 103 104 103  CO2 '27 23 27  '$ GLUCOSE 121* 121* 125*  BUN 29* 29* 40*  CREATININE 1.28* 1.20 1.37*  CALCIUM 9.1 8.9 9.2    GFR: Estimated Creatinine Clearance: 56.6 mL/min (A) (by C-G formula based on SCr of 1.37 mg/dL (H)).  Scheduled Meds:  amLODipine  5 mg Oral Daily   arformoterol  15 mcg Nebulization BID   aspirin  81 mg Oral BID   atorvastatin  20 mg Oral Daily   bictegravir-emtricitabine-tenofovir AF  1 tablet Oral Daily   budesonide (PULMICORT) nebulizer  solution  0.25 mg Nebulization BID   carvedilol  25 mg Oral BID   Chlorhexidine Gluconate Cloth  6 each Topical Daily   docusate sodium  100 mg Oral BID   doxycycline  100 mg Oral Q12H   nicotine  21 mg Transdermal Daily   pantoprazole  40 mg Oral Daily   predniSONE  40 mg Oral Q breakfast   revefenacin  175 mcg Nebulization Daily   Continuous Infusions:  sodium chloride 75 mL/hr at 11/19/21 1321   methocarbamol (ROBAXIN) IV       LOS: 3 days   Author: Debbe Odea  11/19/2021 4:32 PM

## 2021-11-19 NOTE — Evaluation (Signed)
Physical Therapy Evaluation Patient Details Name: Nicholas [name] MRN: [id number] DOB: 1951-02-18 Today's Date: 11/19/2021  History of Present Illness  71 year old male who presents to [facility] ED 9/27 after fall, found to have R hip fx. Transferred to [facility] and is s/p 9/29 THA PMH: COPD who continues to smoke, HIV, coronary artery disease status post stenting, CKD stage III and hypertension.  Clinical Impression  PTA pt living alone in apartment with level entry. Pt reports independence with ambulation, ADLs and iADLs, sister provides transportation. Pt is currently limited in safe mobility by increased O2 demand especially with mobility, as well as R hip surgical site pain in presence of generalized weakness and decreased endurance. Pt is modA for bed mobility, modA for transfers and minAx2 for ambulation with RW. PT recommending 24 hour supervision and HHPT. PT will continue to follow acutely.      Recommendations for follow up therapy are one component of a multi-disciplinary discharge planning process, led by the attending physician.  Recommendations may be updated based on patient status, additional functional criteria and insurance authorization.  Follow Up Recommendations Home health PT      Assistance Recommended at Discharge Frequent or constant Supervision/Assistance  Patient can return home with the following  A little help with walking and/or transfers;A little help with bathing/dressing/bathroom;Assistance with cooking/housework;Assist for transportation;Help with stairs or ramp for entrance    Equipment Recommendations Rollator (4 wheels);BSC/3in1 (need to trial)  Recommendations for Other Services  OT consult    Functional Status Assessment Patient has had a recent decline in their functional status and demonstrates the ability to make significant improvements in function in a reasonable and predictable amount of time.     Precautions / Restrictions  Precautions Precautions: Fall Precaution Comments: THA after fall Restrictions Weight Bearing Restrictions: Yes RLE Weight Bearing: Weight bearing as tolerated      Mobility  Bed Mobility Overal bed mobility: Needs Assistance Bed Mobility: Supine to Sit     Supine to sit: Mod assist, +2 for safety/equipment     General bed mobility comments: vc for sequencing for LE management off EoB, reaching for bedrail and pulling hips to EoB    Transfers Overall transfer level: Needs assistance Equipment used: Rolling walker (2 wheels) Transfers: Sit to/from Stand Sit to Stand: Mod assist           General transfer comment: modA for power up and steadying, vc for hand placement and upright posture    Ambulation/Gait Ambulation/Gait assistance: Min assist, +2 safety/equipment Gait Distance (Feet): 12 Feet Assistive device: Rolling walker (2 wheels) Gait Pattern/deviations: Step-to pattern, Decreased step length - right, Decreased step length - left, Decreased stance time - right, Decreased weight shift to right, Antalgic Gait velocity: slowed Gait velocity interpretation: <1.31 ft/sec, indicative of household ambulator   General Gait Details: minA for steadying vc for sequencing, close chair follow        Balance Overall balance assessment: Needs assistance Sitting-balance support: Feet supported, No upper extremity supported Sitting balance-Leahy Scale: Good     Standing balance support: During functional activity, Reliant on assistive device for balance, Bilateral upper extremity supported Standing balance-Leahy Scale: Poor Standing balance comment: requires UE support                             Pertinent Vitals/Pain Pain Assessment Pain Assessment: 0-10 Pain Score: 9  Pain Location: R hip incision site Pain Descriptors / Indicators: Operative site guarding  Pain Intervention(s): Limited activity within patient's tolerance, Monitored during session,  Repositioned, Patient requesting pain meds-RN notified    Home Living Family/patient expects to be discharged to:: Private residence Living Arrangements: Alone Available Help at Discharge: Family;Available 24 hours/day (sisters) Type of Home: Apartment Home Access: Level entry       Home Layout: One level Home Equipment: Grab bars - tub/shower;Hand held shower head;Tub bench      Prior Function Prior Level of Function : Independent/Modified Independent             Mobility Comments: ambulates without AD, requires UE on buggy for grocery shoppin ADLs Comments: independent with ADLs and iADLs sister drives to grocery story        Extremity/Trunk Assessment   Upper Extremity Assessment Upper Extremity Assessment: Overall WFL for tasks assessed    Lower Extremity Assessment Lower Extremity Assessment: RLE deficits/detail RLE Deficits / Details: strength grossly 3+/5 AROM limited by painful R hip RLE: Unable to fully assess due to pain;Unable to fully assess due to immobilization    Cervical / Trunk Assessment Cervical / Trunk Assessment: Kyphotic  Communication   Communication: No difficulties  Cognition Arousal/Alertness: Awake/alert Behavior During Therapy: WFL for tasks assessed/performed Overall Cognitive Status: Impaired/Different from baseline Area of Impairment: Safety/judgement, Problem solving                         Safety/Judgement: Decreased awareness of safety, Decreased awareness of deficits   Problem Solving: Requires verbal cues, Requires tactile cues General Comments: pt with poor understanding of COPD and increased O2 demand on increased probability of falls, requires increased cuing for task completion        General Comments General comments (skin integrity, edema, etc.): Pt on 2L O2 via [place] SpO2 83%O2 with mobilization requiring 3L for maintaining SpO2 90%O2 with ambulation    Exercises General Exercises - Lower Extremity Ankle  Circles/Pumps: AROM, Both, 10 reps, Supine Other Exercises Other Exercises: IS x5 max inhalation 1750 mL   Assessment/Plan    PT Assessment Patient needs continued PT services  PT Problem List Decreased strength;Decreased range of motion;Decreased activity tolerance;Decreased balance;Decreased mobility;Decreased coordination;Decreased knowledge of use of DME;Cardiopulmonary status limiting activity;Pain       PT Treatment Interventions DME instruction;Gait training;Functional mobility training;Therapeutic activities;Therapeutic exercise;Balance training;Neuromuscular re-education;Patient/family education    PT Goals (Current goals can be found in the Care Plan section)  Acute Rehab PT Goals Patient Stated Goal: go home PT Goal Formulation: With patient Time For Goal Achievement: 12/03/21 Potential to Achieve Goals: Fair    Frequency Min 3X/week        AM-PAC PT "6 Clicks" Mobility  Outcome Measure Help needed turning from your back to your side while in a flat bed without using bedrails?: A Little Help needed moving from lying on your back to sitting on the side of a flat bed without using bedrails?: A Lot Help needed moving to and from a bed to a chair (including a wheelchair)?: A Little Help needed standing up from a chair using your arms (e.g., wheelchair or bedside chair)?: A Little Help needed to walk in hospital room?: A Little Help needed climbing 3-5 steps with a railing? : Total 6 Click Score: 15    End of Session Equipment Utilized During Treatment: Gait belt;Oxygen Activity Tolerance: Patient limited by pain Patient left: in chair;with call bell/phone within reach;with chair alarm set Nurse Communication: Mobility status;Patient requests pain meds PT Visit Diagnosis: Unsteadiness on  feet (R26.81);Other abnormalities of gait and mobility (R26.89);Muscle weakness (generalized) (M62.81);History of falling (Z91.81);Difficulty in walking, not elsewhere classified  (R26.2);Pain Pain - Right/Left: Right Pain - part of body: Hip    Time: 1341-1414 PT Time Calculation (min) (ACUTE ONLY): 33 min   Charges:   PT Evaluation $PT Eval Moderate Complexity: 1 Mod          Ronan Duecker B. Migdalia Dk PT, DPT Acute Rehabilitation Services Please use secure chat or  Call Office [phone]   [place] 11/19/2021, 5:03 PM

## 2021-11-19 NOTE — Progress Notes (Signed)
  Subjective: Nicholas [name] is a 71 y.o. male s/p right THA.  They are POD 1.  Pt's pain is controlled but sore.  Pt has not really ambulated yet.  Has not worked with physical therapy yet.  No fevers or chills.  No chest pain or trouble breathing.  No abdominal pain.  No calf pain.  Lives at home by himself.  Does have some family members that can come and stay with him.  He is determined not to go to a rehab center.  Objective: Vital signs in last 24 hours: Temp:  [97.2 F (36.2 C)-98.1 F (36.7 C)] 97.7 F (36.5 C) (09/30 0903) Pulse Rate:  [53-80] 61 (09/30 0903) Resp:  [12-19] 19 (09/30 0903) BP: (92-141)/(55-78) 141/64 (09/30 0903) SpO2:  [87 %-95 %] 92 % (09/30 0903)  Intake/Output from previous day: 09/29 0701 - 09/30 0700 In: 2440.5 [I.V.:1880; IV Piggyback:560.5] Out: 1200 [Urine:1125; Blood:75] Intake/Output this shift: No intake/output data recorded.  Exam:  No gross blood or drainage overlying the dressing except for 3 very small spots of bleeding 1+ DP pulse Sensation intact distally in the right foot Able to dorsiflex and plantarflex the right foot No calf tenderness.  Negative Homans' sign.   Labs: Recent Labs    11/16/21 2025 11/16/21 2227 11/19/21 0309  HGB 15.9 16.1 14.7   Recent Labs    11/16/21 2227 11/19/21 0309  WBC 13.9* 9.0  RBC 4.56 4.22  HCT 45.4 41.5  PLT 137* 133*   Recent Labs    11/16/21 2227 11/19/21 0309  NA 136 139  K 4.3 3.5  CL 104 103  CO2 23 27  BUN 29* 40*  CREATININE 1.20 1.37*  GLUCOSE 121* 125*  CALCIUM 8.9 9.2   No results for input(s): "LABPT", "INR" in the last 72 hours.  Assessment/Plan: Pt is POD 1 s/p right THA.    -Plan to discharge to home versus SNF in coming days pending patient's pain and PT eval as well as his medical status.  Since there is a high likelihood that he will have to discharge to rehab but he states that he really wants to discharge home if possible.  He does have family members that can  come and help him out at home.  He does live alone.  -WBAT with a walker  -Aspirin 81 mg twice daily for DVT prophylaxis   [name] 11/19/2021, 12:18 PM

## 2021-11-19 NOTE — Consult Note (Signed)
   NAME:  Nicholas [name], MRN:  [id number], DOB:  Apr 07, 1950, LOS: 3 ADMISSION DATE:  11/16/2021, CONSULTATION DATE:  11/17/21 REFERRING MD:  TRH CHIEF COMPLAINT:  Acute Hypoxia Respiratory Failure   History of Present Illness:  Nicholas [name] is a 71 y.o. male with history of CAD status post stenting, COPD, HIV, chronic kidney disease stage III, hypertension had presented to the ER at [facility] with a fall.  Patient denies hitting his head or losing consciousness.  He states his legs gave way and he fell. Pt had planned hip surgery but has been becoming hypoxic and PCCM consulted for hypoxia. Workup so far includes Negative Covid/Flu/RSV, CTA for PE negative but did show pulmonary nodules some which are unchanged from prior and some new ones. CBC showing leukocytosis that is improving and CXR showing bilateral opacities but negative for effusion or pneumothorax. CXR also shows hyperinflation of the lungs. Troponin negative and BNP elevated 313.9. Pt did receive 20 mg IV lasix this am.   Pertinent  Medical History  CAD status post stenting, COPD, HIV, chronic kidney disease stage III, hypertension  Interim History / Subjective:   No acute events overnight. Tolerated surgery well.   He is working with incentive spirometer and flutter valve. Reports increase mucous production with starting nebs   Objective   Blood pressure (!) 141/64, pulse 61, temperature 97.7 F (36.5 C), temperature source Oral, resp. rate 19, height 5' 10.5" (1.791 m), weight 91 kg, SpO2 92 %.        Intake/Output Summary (Last 24 hours) at 11/19/2021 1136 Last data filed at 11/19/2021 0700 Gross per 24 hour  Intake 2440.52 ml  Output 550 ml  Net 1890.52 ml   Filed Weights   11/17/21 0057  Weight: 91 kg   Examination: General: elderly male, no acute distress, resting in bed HEENT: /AT, moist mucous membranes, sclera anicteric Neuro: A&O x 3, moving all extremities CV: rrr, s1s2, no murmurs PULM: end expiratory  wheezing bilaterally.  GI: soft, non-tender, non-distended, BS+ Extremities: warm, no edema Skin: no rashes   Assessment & Plan:  Acute hypoxic Respiratory Failure: Likely multifactorial, and probably at least some of this is chronic. His CT chest from [facility] shows quite significant emphysema. Likely has COPD exacerbation based on wheeze. BNP elevated and he did receive low dose lasix. Echocardiogram with normal LVEF and grade 1 diastolic dysfunction. Less likely there could be fat embolism considering his femur fracture, which would have to be a diagnosis of exclusion.  -Wean oxygen as able for sats 90-92%, will likely need home O2 -doxycycline 100 mg BID for 5 day course -Prednisone '40mg'$  for 5 day course -Budesonide and brovana twice daily, yupelri daily -Change back to stiolto when ready for discharge. Will need outpatient pulmonary follow up. -Smoking cessation recommended  PCCM will follow up on 10/1, please call if any questions arise over the weekend.  Best Practice (right click and "Reselect all SmartList Selections" daily)  Per primary   Freda Jackson, MD [facility] Office: [phone]   See Amion for personal pager PCCM on call pager [phone] until 7pm. Please call Elink 7p-7a. [phone]

## 2021-11-20 DIAGNOSIS — S72001A Fracture of unspecified part of neck of right femur, initial encounter for closed fracture: Secondary | ICD-10-CM | POA: Diagnosis not present

## 2021-11-20 DIAGNOSIS — J9601 Acute respiratory failure with hypoxia: Secondary | ICD-10-CM | POA: Diagnosis not present

## 2021-11-20 LAB — CBC
HCT: 38.8 % — ABNORMAL LOW (ref 39.0–52.0)
Hemoglobin: 13.3 g/dL (ref 13.0–17.0)
MCH: 34.2 pg — ABNORMAL HIGH (ref 26.0–34.0)
MCHC: 34.3 g/dL (ref 30.0–36.0)
MCV: 99.7 fL (ref 80.0–100.0)
Platelets: 108 10*3/uL — ABNORMAL LOW (ref 150–400)
RBC: 3.89 MIL/uL — ABNORMAL LOW (ref 4.22–5.81)
RDW: 12.9 % (ref 11.5–15.5)
WBC: 8.5 10*3/uL (ref 4.0–10.5)
nRBC: 0 % (ref 0.0–0.2)

## 2021-11-20 LAB — BASIC METABOLIC PANEL
Anion gap: 4 — ABNORMAL LOW (ref 5–15)
BUN: 32 mg/dL — ABNORMAL HIGH (ref 8–23)
CO2: 22 mmol/L (ref 22–32)
Calcium: 8.1 mg/dL — ABNORMAL LOW (ref 8.9–10.3)
Chloride: 110 mmol/L (ref 98–111)
Creatinine, Ser: 1.19 mg/dL (ref 0.61–1.24)
GFR, Estimated: >60 mL/min (ref >60)
Glucose, Bld: 117 mg/dL — ABNORMAL HIGH (ref 70–99)
Potassium: 3.9 mmol/L (ref 3.5–5.1)
Sodium: 136 mmol/L (ref 135–145)

## 2021-11-20 MED ORDER — CARVEDILOL 6.25 MG PO TABS
6.2500 mg | ORAL_TABLET | Freq: Two times a day (BID) | ORAL | Status: DC
  Administered 2021-11-21: 6.25 mg via ORAL
  Filled 2021-11-20: qty 1

## 2021-11-20 NOTE — Progress Notes (Signed)
OT Cancellation Note  Patient Details Name: Nicholas [name] MRN: [id number] DOB: 07/26/1950   Cancelled Treatment:    Reason Eval/Treat Not Completed: Other (comment) (Soft BPs, will hold evaluation. OT to f/u tomorrow.)  Elliot Cousin 11/20/2021, 4:59 PM

## 2021-11-20 NOTE — Consult Note (Signed)
   NAME:  Nicholas [name], MRN:  [id number], DOB:  [birth year], LOS: 4 ADMISSION DATE:  11/16/2021, CONSULTATION DATE:  11/17/21 REFERRING MD:  TRH CHIEF COMPLAINT:  Acute Hypoxia Respiratory Failure   History of Present Illness:  Nicholas [name] is a 71 y.o. male with history of CAD status post stenting, COPD, HIV, chronic kidney disease stage III, hypertension had presented to the ER at [facility] with a fall.  Patient denies hitting his head or losing consciousness.  He states his legs gave way and he fell. Pt had planned hip surgery but has been becoming hypoxic and PCCM consulted for hypoxia. Workup so far includes Negative Covid/Flu/RSV, CTA for PE negative but did show pulmonary nodules some which are unchanged from prior and some new ones. CBC showing leukocytosis that is improving and CXR showing bilateral opacities but negative for effusion or pneumothorax. CXR also shows hyperinflation of the lungs. Troponin negative and BNP elevated 313.9. Pt did receive 20 mg IV lasix this am.   Pertinent  Medical History  CAD status post stenting, COPD, HIV, chronic kidney disease stage III, hypertension  Interim History / Subjective:   No acute events overnight.   Reports breathing is improved. He is not coughing up mucous anymore.  He is working with incentive spirometer and flutter valve.   Objective   Blood pressure 135/63, pulse 60, temperature 98.6 F (37 C), temperature source Oral, resp. rate 13, height 5' 10.5" (1.791 m), weight 91 kg, SpO2 92 %.        Intake/Output Summary (Last 24 hours) at 11/20/2021 1409 Last data filed at 11/20/2021 0248 Gross per 24 hour  Intake 600 ml  Output 700 ml  Net -100 ml   Filed Weights   11/17/21 0057  Weight: 91 kg   Examination: General: elderly male, no acute distress, resting in bed HEENT: [place]/AT, moist mucous membranes, sclera anicteric Neuro: A&O x 3, moving all extremities CV: rrr, s1s2, no murmurs PULM: no wheezing, clear to  auscultation. GI: soft, non-tender, non-distended, BS+ Extremities: warm, no edema Skin: no rashes   Assessment & Plan:  Acute hypoxic Respiratory Failure: Likely multifactorial, and probably at least some of this is chronic. His CT chest from [facility] shows quite significant emphysema. Likely has COPD exacerbation based on wheeze.  -Wean oxygen as able for sats 90-92%, will likely need home O2 -doxycycline 100 mg BID for 5 day course -Prednisone '40mg'$  for 5 day course -Budesonide and brovana twice daily, yupelri daily -Change back to stiolto when ready for discharge. Will need outpatient pulmonary follow up. -Smoking cessation recommended  PCCM will sign off, patient overall improved from respiratory standpoint. Please call with any further questions.  Best Practice (right click and "Reselect all SmartList Selections" daily)  Per primary   Freda Jackson, MD [facility] Office: [phone]   See Amion for personal pager PCCM on call pager [phone] until 7pm. Please call Elink 7p-7a. [phone]

## 2021-11-20 NOTE — Progress Notes (Signed)
  Subjective: Patient stable.  Walked in the room today.   Objective: Vital signs in last 24 hours: Temp:  [98 F (36.7 C)-98.7 F (37.1 C)] 98.6 F (37 C) (10/01 0750) Pulse Rate:  [46-68] 60 (10/01 0750) Resp:  [11-18] 13 (10/01 0750) BP: (100-138)/(56-77) 135/63 (10/01 0750) SpO2:  [92 %-96 %] 92 % (10/01 0922)  Intake/Output from previous day: 09/30 0701 - 10/01 0700 In: 600 [P.O.:600] Out: 700 [Urine:700] Intake/Output this shift: No intake/output data recorded.  Exam:  Sensation intact distally Dorsiflexion/Plantar flexion intact  Labs: Recent Labs    11/19/21 0309 11/20/21 0355  HGB 14.7 13.3   Recent Labs    11/19/21 0309 11/20/21 0355  WBC 9.0 8.5  RBC 4.22 3.89*  HCT 41.5 38.8*  PLT 133* 108*   Recent Labs    11/19/21 0309 11/20/21 0355  NA 139 136  K 3.5 3.9  CL 103 110  CO2 27 22  BUN 40* 32*  CREATININE 1.37* 1.19  GLUCOSE 125* 117*  CALCIUM 9.2 8.1*   No results for input(s): "LABPT", "INR" in the last 72 hours.  Assessment/Plan: Plan is mobilized today.  Anticipate discharge early this week.  Overall he is doing well and his pain is mild to moderate   [employer] 11/20/2021, 11:14 AM

## 2021-11-20 NOTE — Progress Notes (Signed)
[facility] Progress Note  Patient: Nicholas [name]     JWJ:[id number]  DOA: 11/16/2021   PCP: Almyra Nation, MD       Brief hospital course: This is a 71 year old male with COPD who continues to smoke, HIV, coronary artery disease status post stenting, CKD stage III and hypertension who presented to [facility] after a fall. In the ED, he was found to have a right hip fracture.  He became hypoxic and required 6 L of oxygen.  CT angiogram was performed and was negative for PE but was concerning for bronchitis.  He was started on IV steroids and antibiotics and transferred to [facility]. Chest x-ray reveals bilateral opacities hyperinflation.  The patient was given IV Lasix times once.    Subjective:  Continues to have improvement in cough.   Assessment and Plan: Principal Problem:   Closed right hip fracture ([place]) - appreciate ortho eval - 9/29> THA - ASA 81 mg BID for DVT prophylaxis - WBAT with walker - hgb 15.9 > 13.3  Active Problems:   Acute respiratory failure with hypoxia (HCC)- h/o COPD - appreciate PCCM eval - has been weaned down to 2 l  - unable to wean down further yet as pulse ox drops to 88% at rest on room air - COVID, RSV, flu negative - ECHO > normal EF and grade 1 diastolic dysfunction - cont Prednisone and Doxy  Thrombocytopenia - acute- platelets dropped further from 133 to 108 today - follow    HIV disease ([place]) - cont Biktarvy  Rising Cr - hold Losartan- ? Due to acute blood loss - Cr improved to 1.19  HTN - cont Amlodipine & Carvedilol - holding Losartan   Cigarette smoker - smokes 1ppd- cont Nicoderm patch       Code Status: Full Code DVT prophylaxis:    Consultants: PCCM, Ortho Level of Care: Level of care: Med-Surg  Objective:   Vitals:   11/20/21 0342 11/20/21 0601 11/20/21 0750 11/20/21 0922  BP: (!) 100/56 (!) 112/58 135/63   Pulse: (!) 46 60 60   Resp: '11 12 13   '$ Temp: 98.7 F (37.1 C) 98.7 F (37.1  C) 98.6 F (37 C)   TempSrc: Oral  Oral   SpO2: 96%  92% 92%  Weight:      Height:       Filed Weights   11/17/21 0057  Weight: 91 kg   Exam: General exam: Appears comfortable  HEENT: oral mucosa moist Respiratory system: Clear to auscultation.  Cardiovascular system: S1 & S2 heard  Gastrointestinal system: Abdomen soft, non-tender, nondistended. Normal bowel sounds   Extremities: No cyanosis, clubbing or edema Psychiatry:  Mood & affect appropriate.    Imaging and lab data was personally reviewed    CBC: Recent Labs  Lab 11/16/21 2025 11/16/21 2227 11/19/21 0309 11/20/21 0355  WBC 14.5* 13.9* 9.0 8.5  NEUTROABS 12.4*  --   --   --   HGB 15.9 16.1 14.7 13.3  HCT 45.4 45.4 41.5 38.8*  MCV 99.6 99.6 98.3 99.7  PLT 145* 137* 133* 108*    Basic Metabolic Panel: Recent Labs  Lab 11/16/21 2025 11/16/21 2227 11/19/21 0309 11/20/21 0355  NA 136 136 139 136  K 5.0 4.3 3.5 3.9  CL 103 104 103 110  CO2 '27 23 27 22  '$ GLUCOSE 121* 121* 125* 117*  BUN 29* 29* 40* 32*  CREATININE 1.28* 1.20 1.37* 1.19  CALCIUM 9.1 8.9 9.2 8.1*  GFR: Estimated Creatinine Clearance: 65.2 mL/min (by C-G formula based on SCr of 1.19 mg/dL).  Scheduled Meds:  amLODipine  5 mg Oral Daily   arformoterol  15 mcg Nebulization BID   aspirin  81 mg Oral BID   atorvastatin  20 mg Oral Daily   bictegravir-emtricitabine-tenofovir AF  1 tablet Oral Daily   budesonide (PULMICORT) nebulizer solution  0.25 mg Nebulization BID   carvedilol  25 mg Oral BID   Chlorhexidine Gluconate Cloth  6 each Topical Daily   docusate sodium  100 mg Oral BID   doxycycline  100 mg Oral Q12H   nicotine  21 mg Transdermal Daily   pantoprazole  40 mg Oral Daily   pneumococcal 20-valent conjugate vaccine  0.5 mL Intramuscular Tomorrow-1000   revefenacin  175 mcg Nebulization Daily   Continuous Infusions:  sodium chloride 75 mL/hr at 11/19/21 1321   methocarbamol (ROBAXIN) IV       LOS: 4 days    Author: Debbe Odea  11/20/2021 1:14 PM

## 2021-11-21 ENCOUNTER — Encounter (HOSPITAL_COMMUNITY): Payer: Self-pay | Admitting: Orthopaedic Surgery

## 2021-11-21 ENCOUNTER — Other Ambulatory Visit (HOSPITAL_COMMUNITY): Payer: Self-pay

## 2021-11-21 DIAGNOSIS — J441 Chronic obstructive pulmonary disease with (acute) exacerbation: Secondary | ICD-10-CM

## 2021-11-21 DIAGNOSIS — F17210 Nicotine dependence, cigarettes, uncomplicated: Secondary | ICD-10-CM | POA: Diagnosis not present

## 2021-11-21 DIAGNOSIS — S72001A Fracture of unspecified part of neck of right femur, initial encounter for closed fracture: Secondary | ICD-10-CM | POA: Diagnosis not present

## 2021-11-21 DIAGNOSIS — J9601 Acute respiratory failure with hypoxia: Secondary | ICD-10-CM | POA: Diagnosis not present

## 2021-11-21 DIAGNOSIS — J432 Centrilobular emphysema: Secondary | ICD-10-CM | POA: Diagnosis not present

## 2021-11-21 DIAGNOSIS — I2510 Atherosclerotic heart disease of native coronary artery without angina pectoris: Secondary | ICD-10-CM

## 2021-11-21 DIAGNOSIS — D696 Thrombocytopenia, unspecified: Secondary | ICD-10-CM

## 2021-11-21 LAB — BASIC METABOLIC PANEL
Anion gap: 11 (ref 5–15)
BUN: 33 mg/dL — ABNORMAL HIGH (ref 8–23)
CO2: 21 mmol/L — ABNORMAL LOW (ref 22–32)
Calcium: 8.3 mg/dL — ABNORMAL LOW (ref 8.9–10.3)
Chloride: 108 mmol/L (ref 98–111)
Creatinine, Ser: 1.33 mg/dL — ABNORMAL HIGH (ref 0.61–1.24)
GFR, Estimated: 57 mL/min — ABNORMAL LOW (ref >60)
Glucose, Bld: 122 mg/dL — ABNORMAL HIGH (ref 70–99)
Potassium: 4.8 mmol/L (ref 3.5–5.1)
Sodium: 140 mmol/L (ref 135–145)

## 2021-11-21 LAB — CBC
HCT: 37.5 % — ABNORMAL LOW (ref 39.0–52.0)
Hemoglobin: 13 g/dL (ref 13.0–17.0)
MCH: 34.8 pg — ABNORMAL HIGH (ref 26.0–34.0)
MCHC: 34.7 g/dL (ref 30.0–36.0)
MCV: 100.3 fL — ABNORMAL HIGH (ref 80.0–100.0)
Platelets: 103 10*3/uL — ABNORMAL LOW (ref 150–400)
RBC: 3.74 MIL/uL — ABNORMAL LOW (ref 4.22–5.81)
RDW: 13.1 % (ref 11.5–15.5)
WBC: 9.3 10*3/uL (ref 4.0–10.5)
nRBC: 0 % (ref 0.0–0.2)

## 2021-11-21 MED ORDER — CARVEDILOL 6.25 MG PO TABS
6.2500 mg | ORAL_TABLET | Freq: Two times a day (BID) | ORAL | 0 refills | Status: DC
  Filled 2021-11-21: qty 60, 30d supply, fill #0

## 2021-11-21 MED ORDER — ENSURE ENLIVE PO LIQD
237.0000 mL | Freq: Every day | ORAL | Status: DC
  Administered 2021-11-21: 237 mL via ORAL

## 2021-11-21 MED ORDER — OXYCODONE HCL 5 MG PO TABS
5.0000 mg | ORAL_TABLET | ORAL | 0 refills | Status: DC | PRN
  Filled 2021-11-21: qty 30, 3d supply, fill #0

## 2021-11-21 NOTE — Evaluation (Signed)
Occupational Therapy Evaluation Patient Details Name: Nicholas [name] MRN: [id number] DOB: 1950-06-01 Today's Date: 11/21/2021   History of Present Illness 71 year old male who presents to [facility] ED 9/27 after fall, found to have R hip fx. Transferred to [facility] and is s/p 9/29 THA. PMH: COPD who continues to smoke, HIV, coronary artery disease status post stenting, CKD stage III and hypertension.   Clinical Impression   PTA, pt was living alone and was independent with ADLs; sister assists with grocery shopping. Pt currently requiring Min Guard A for ADLs and functional mobility. Pt presenting with decreased activity tolerance due to fatigue and pain. Despite pain, pt motivated to participate. SpO2 maintaining in 90s on RA; one moment of SpO2 dropping to 88% in seated position but returned to 90s on purse lip breathing. Pt would benefit from further acute OT to facilitate safe dc. Recommend dc to home with HHOT for further OT to optimize safety, independence with ADLs, and return to PLOF.      Recommendations for follow up therapy are one component of a multi-disciplinary discharge planning process, led by the attending physician.  Recommendations may be updated based on patient status, additional functional criteria and insurance authorization.   Follow Up Recommendations  Home health OT    Assistance Recommended at Discharge PRN  Patient can return home with the following      Functional Status Assessment  Patient has had a recent decline in their functional status and demonstrates the ability to make significant improvements in function in a reasonable and predictable amount of time.  Equipment Recommendations  BSC/3in1    Recommendations for Other Services       Precautions / Restrictions Precautions Precautions: Fall Restrictions Weight Bearing Restrictions: Yes RLE Weight Bearing: Weight bearing as tolerated      Mobility Bed Mobility Overal bed mobility: Needs  Assistance Bed Mobility: Supine to Sit     Supine to sit: Min guard, HOB elevated     General bed mobility comments: Min Guard for safety. needing increased time    Transfers Overall transfer level: Needs assistance Equipment used: Rolling walker (2 wheels) Transfers: Sit to/from Stand Sit to Stand: Min guard           General transfer comment: Min Guard A for safety      Balance Overall balance assessment: Needs assistance Sitting-balance support: Feet supported, No upper extremity supported Sitting balance-Leahy Scale: Good     Standing balance support: During functional activity, Reliant on assistive device for balance, Bilateral upper extremity supported Standing balance-Leahy Scale: Poor Standing balance comment: requires UE support                           ADL either performed or assessed with clinical judgement   ADL Overall ADL's : Needs assistance/impaired Eating/Feeding: Set up;Sitting   Grooming: Set up;Supervision/safety;Sitting   Upper Body Bathing: Supervision/ safety;Set up;Sitting   Lower Body Bathing: Min guard;Sit to/from stand   Upper Body Dressing : Supervision/safety;Set up;Sitting   Lower Body Dressing: Min guard;Sit to/from stand Lower Body Dressing Details (indicate cue type and reason): Min Guard A for safety. Pt able to lean forward and adjust sock on R foot with increased time. Educating pt on donning RLE first Armed forces technical officer: Min guard;Ambulation;Rolling walker (2 wheels) (simulated to recliner)           Functional mobility during ADLs: Min guard;Rolling walker (2 wheels) General ADL Comments: Pt performing ADLs and functional mobility  in hallway with [employer] A     Vision         Perception     Praxis      Pertinent Vitals/Pain Pain Assessment Pain Assessment: 0-10 Pain Score: 8  Pain Location: R hip incision site; "I can feel the staples" Pain Descriptors / Indicators: Operative site guarding Pain  Intervention(s): Monitored during session, Limited activity within patient's tolerance, Repositioned     Hand Dominance     Extremity/Trunk Assessment Upper Extremity Assessment Upper Extremity Assessment: Overall WFL for tasks assessed   Lower Extremity Assessment Lower Extremity Assessment: Defer to PT evaluation RLE Deficits / Details: strength grossly 3+/5 AROM limited by painful R hip RLE: Unable to fully assess due to pain;Unable to fully assess due to immobilization   Cervical / Trunk Assessment Cervical / Trunk Assessment: Kyphotic   Communication Communication Communication: No difficulties   Cognition Arousal/Alertness: Awake/alert Behavior During Therapy: WFL for tasks assessed/performed Overall Cognitive Status: Impaired/Different from baseline Area of Impairment: Problem solving                             Problem Solving: Slow processing General Comments: Increased time     General Comments  SpO2 maintaining in 90s on RA; however, dropping to 88% once sitting at recliner. Able to elevate with purse lip breathing.    Exercises Other Exercises Other Exercises: IS x10   Shoulder Instructions      Home Living Family/patient expects to be discharged to:: Private residence Living Arrangements: Alone Available Help at Discharge: Family;Available 24 hours/day (sisters) Type of Home: Apartment Home Access: Level entry     Home Layout: One level     Bathroom Shower/Tub: Teacher, early years/pre: Standard Bathroom Accessibility: Yes   Home Equipment: Grab bars - tub/shower;Hand held shower head;Shower seat          Prior Functioning/Environment Prior Level of Function : Independent/Modified Independent             Mobility Comments: ambulates without AD, requires UE on buggy for grocery shoppin ADLs Comments: independent with ADLs and iADLs sister drives to grocery story        OT Problem List: Decreased  strength;Decreased range of motion;Decreased activity tolerance;Impaired balance (sitting and/or standing);Decreased knowledge of use of DME or AE;Decreased knowledge of precautions;Pain      OT Treatment/Interventions: Self-care/ADL training;Therapeutic exercise;Energy conservation;DME and/or AE instruction;Therapeutic activities;Patient/family education;Balance training    OT Goals(Current goals can be found in the care plan section) Acute Rehab OT Goals Patient Stated Goal: Go home OT Goal Formulation: With patient Time For Goal Achievement: 12/05/21 Potential to Achieve Goals: Good  OT Frequency: Min 3X/week    Co-evaluation              AM-PAC OT "6 Clicks" Daily Activity     Outcome Measure Help from another person eating meals?: None Help from another person taking care of personal grooming?: A Little Help from another person toileting, which includes using toliet, bedpan, or urinal?: A Little Help from another person bathing (including washing, rinsing, drying)?: A Little Help from another person to put on and taking off regular upper body clothing?: A Little Help from another person to put on and taking off regular lower body clothing?: A Little 6 Click Score: 19   End of Session Equipment Utilized During Treatment: Rolling walker (2 wheels) Nurse Communication: Mobility status;Patient requests pain meds  Activity Tolerance: Patient  tolerated treatment well Patient left: in chair;with call bell/phone within reach;with chair alarm set;with nursing/sitter in room  OT Visit Diagnosis: Unsteadiness on feet (R26.81);Other abnormalities of gait and mobility (R26.89);Muscle weakness (generalized) (M62.81);Pain Pain - Right/Left: Right Pain - part of body: Leg                Time: 7471-5953 OT Time Calculation (min): 24 min Charges:  OT General Charges $OT Visit: 1 Visit OT Evaluation $OT Eval Low Complexity: 1 Low OT Treatments $Self Care/Home Management : 8-22  mins  Gyasi Hazzard MSOT, OTR/L Acute Rehab Office: [place] 11/21/2021, 12:31 PM

## 2021-11-21 NOTE — Progress Notes (Addendum)
SATURATION QUALIFICATIONS: (This note is used to comply with regulatory documentation for home oxygen)  Patient Saturations on Room Air at Rest = 97%  Patient Saturations on Room Air while Ambulating = 94%  Patient able to perform ADLs and functional mobility in RA with SpO2 maintaining in 90s. One moment with SpO2 dropping to 88% on RA upon initially sitting after mobility, however, quickly returned to >90%.   [place], OTR/L Acute Rehab Office: [phone]

## 2021-11-21 NOTE — Discharge Summary (Addendum)
Physician Discharge Summary  Nicholas [name] GGE:[id number] DOB: Sep 26, 1950 DOA: 11/16/2021  PCP: Lucas Nation, MD  Admit date: 11/16/2021 Discharge date: 11/21/2021 Discharging to: home with [facility] Recommendations for Outpatient Follow-up:  Please f/u CBC, Bmet and BP  Consults:  Ortho Pulm  Procedures:  Right direct anterior total hip arthroplasty   Discharge Diagnoses:   Principal Problem:   Closed right hip fracture ([place]) Active Problems:   Acute respiratory failure with hypoxia ([place])   COPD with acute exacerbation (HCC)   HIV disease ([place])   CKD (chronic kidney disease) stage 3, GFR 30-59 ml/min (HCC)   Cigarette smoker   Grover's disease   Centrilobular emphysema (HCC)   Thrombocytopenia (HCC)   CAD (coronary artery disease)     Hospital Course:  This is a 71 year old male with COPD who continues to smoke, HIV, coronary artery disease status post stenting, CKD stage III and hypertension who presented to [facility] after a fall. In the ED, he was found to have a right hip fracture.  He became hypoxic and required 6 L of oxygen.  CT angiogram was performed and was negative for PE but was concerning for bronchitis.  He was started on IV steroids and antibiotics and transferred to [facility]. Chest x-ray reveals bilateral opacities hyperinflation.  The patient was given IV Lasix times once.    Principal Problem:   Closed right hip fracture ([place]) - appreciate ortho eval - 9/29> THA - he takes ASA and Plavix at home - WBAT with walker - hgb 15.9 > 13.3   Active Problems:   Acute respiratory failure with hypoxia (HCC)- COPD exacerbation  Cigarette smoker - appreciate PCCM eval - COVID, RSV, flu negative - ECHO > normal EF and grade 1 diastolic dysfunction - completed Prednisone and Doxy x 5 days - has been weaned down to room air - smokes 1ppd- cont Nicoderm patch- he plans to continue the patches   Thrombocytopenia - acute-  - 133> 108> 103 -  likely due to acute blood loss- f/u as outpt    HIV disease ([place]) - cont Biktarvy   CKD 2-3 - hold Losartan- ? Due to acute blood loss - Cr 1.28> 1.19> 1.33   Hypotension with h/o HTN - have reduced Carvedilol to 6.25 - holding Losartan and Amlodipine       Overweight  Body mass index is 28.38 kg/m.     Discharge Instructions   Allergies as of 11/21/2021       Reactions   Penicillins Anaphylaxis   High fever  Did it involve swelling of the face/tongue/throat, SOB, or low BP? No Did it involve sudden or severe rash/hives, skin peeling, or any reaction on the inside of your mouth or nose? No Did you need to seek medical attention at a hospital or doctor's office? Yes When did it last happen?      45 + years If all above answers are "NO", may proceed with cephalosporin use.   Tape Other (See Comments)   Bruising of skin         Medication List     STOP taking these medications    amLODipine 5 MG tablet Commonly known as: NORVASC   hydrochlorothiazide 25 MG tablet Commonly known as: HYDRODIURIL       TAKE these medications    acetaminophen 325 MG tablet Commonly known as: TYLENOL Take 325 mg by mouth daily as needed for headache.   aspirin EC 81 MG tablet Take 1 tablet (81  mg total) by mouth daily.   atorvastatin 20 MG tablet Commonly known as: LIPITOR Take 1 tablet (20 mg total) by mouth daily.   Biktarvy 50-200-25 MG Tabs tablet Generic drug: bictegravir-emtricitabine-tenofovir AF TAKE 1 TABLET BY MOUTH DAILY   carvedilol 6.25 MG tablet Commonly known as: COREG Take 1 tablet (6.25 mg total) by mouth 2 (two) times daily with a meal. What changed:  medication strength how much to take when to take this   clopidogrel 75 MG tablet Commonly known as: PLAVIX Take 1 tablet (75 mg total) by mouth daily.   losartan 25 MG tablet Commonly known as: COZAAR Take 25 mg by mouth daily.   oxyCODONE 5 MG immediate release tablet Commonly known as:  Oxy IR/ROXICODONE Take 1-2 tablets (5-10 mg total) by mouth every 4 (four) hours as needed for moderate pain (pain score 4-6).   Stiolto Respimat 2.5-2.5 MCG/ACT Aers Generic drug: Tiotropium Bromide-Olodaterol Inhale 1 puff into the lungs daily.   Ventolin HFA 108 (90 Base) MCG/ACT inhaler Generic drug: albuterol Take 3 puffs by mouth as needed for shortness of breath or wheezing.               Durable Medical Equipment  (From admission, onward)           Start     Ordered   11/18/21 1738  DME 3 n 1  Once        11/18/21 1737   11/18/21 1738  DME Walker rolling  Once       Question Answer Comment  Walker: With 5 Inch Wheels   Patient needs a walker to treat with the following condition Status post total replacement of right hip      11/18/21 1737            Follow-up Information     Mcarthur Rossetti, MD. Schedule an appointment as soon as possible for a visit in 2 week(s).   Specialty: Orthopedic Surgery Contact information: [place] [address] [phone]         Whitley Nation, MD Follow up in 1 week(s).   Specialty: Internal Medicine Why: Bmet, CBC in 1 wk. BP/ pulse check and adjustment of BP meds. Contact information: [place] [address] [phone]                    The results of significant diagnostics from this hospitalization (including imaging, microbiology, ancillary and laboratory) are listed below for reference.    DG Pelvis Portable  Result Date: 11/18/2021 CLINICAL DATA:  Postop EXAM: PORTABLE PELVIS 1-2 VIEWS COMPARISON:  11/17/2021 FINDINGS: Interval right hip replacement with intact hardware and normal alignment. Gas in the soft tissues consistent with recent surgery. Vascular calcifications IMPRESSION: Status post right hip replacement with expected surgical change Electronically Signed   By: Donavan Foil M.D.   On: 11/18/2021 18:24   DG HIP UNILAT WITH PELVIS 1V  RIGHT  Result Date: 11/18/2021 CLINICAL DATA:  Hip arthroplasty EXAM: DG HIP (WITH OR WITHOUT PELVIS) 1V RIGHT COMPARISON:  11/17/2021 FINDINGS: Five low resolution intraoperative spot views of the right hip. Total fluoroscopy time was 19 seconds, fluoroscopic dose of 1.84 mGy. Images demonstrate right hip replacement with normal alignment IMPRESSION: Intraoperative fluoroscopic assistance provided during right hip replacement Electronically Signed   By: Donavan Foil M.D.   On: 11/18/2021 18:23   DG C-Arm 1-60 Min-No Report  Result Date: 11/18/2021 Fluoroscopy was utilized by the requesting  physician.  No radiographic interpretation.   DG C-Arm 1-60 Min-No Report  Result Date: 11/18/2021 Fluoroscopy was utilized by the requesting physician.  No radiographic interpretation.   ECHOCARDIOGRAM COMPLETE  Result Date: 11/17/2021    ECHOCARDIOGRAM REPORT   Patient Name:   Nicholas [name] Date of Exam: 11/17/2021 Medical Rec #:  [id number]     Height:       70.5 in Accession #:    [id number]    Weight:       200.6 lb Date of Birth:  Jun 21, 1950     BSA:          2.101 m Patient Age:    65 years      BP:           149/66 mmHg Patient Gender: M             HR:           73 bpm. Exam Location:  Inpatient Procedure: 2D Echo, Color Doppler, Cardiac Doppler and Intracardiac            Opacification Agent Indications:    CHF  History:        Patient has prior history of Echocardiogram examinations, most                 recent 05/13/2019. CAD, COPD and Stroke; Risk                 Factors:Hypertension.  Sonographer:    Memory Argue Referring Phys: [place]  1. Challenging windows. Left ventricular ejection fraction, by estimation, is 60 to 65%. The left ventricle has normal function. The left ventricle has no regional wall motion abnormalities. Left ventricular diastolic parameters are consistent with Grade I diastolic dysfunction (impaired relaxation).  2. Right ventricular systolic function is  normal. The right ventricular size is normal.  3. The mitral valve is grossly normal. No evidence of mitral valve regurgitation.  4. The aortic valve was not well visualized. Aortic valve regurgitation is not visualized.  5. The inferior vena cava is normal in size with greater than 50% respiratory variability, suggesting right atrial pressure of 3 mmHg. Comparison(s): No significant change from prior study. FINDINGS  Left Ventricle: Challenging windows. Left ventricular ejection fraction, by estimation, is 60 to 65%. The left ventricle has normal function. The left ventricle has no regional wall motion abnormalities. The left ventricular internal cavity size was normal in size. There is no left ventricular hypertrophy. Left ventricular diastolic parameters are consistent with Grade I diastolic dysfunction (impaired relaxation). Right Ventricle: The right ventricular size is normal. Right ventricular systolic function is normal. Left Atrium: Left atrial size was normal in size. Right Atrium: Right atrial size was normal in size. Pericardium: There is no evidence of pericardial effusion. Mitral Valve: The mitral valve is grossly normal. No evidence of mitral valve regurgitation. Tricuspid Valve: The tricuspid valve is grossly normal. Tricuspid valve regurgitation is not demonstrated. Aortic Valve: The aortic valve was not well visualized. Aortic valve regurgitation is not visualized. Aortic valve mean gradient measures 3.0 mmHg. Aortic valve peak gradient measures 4.9 mmHg. Aortic valve area, by VTI measures 3.41 cm. Pulmonic Valve: The pulmonic valve was not well visualized. Aorta: The aortic root is normal in size and structure. Venous: The inferior vena cava is normal in size with greater than 50% respiratory variability, suggesting right atrial pressure of 3 mmHg. IAS/Shunts: The interatrial septum was not well visualized.  LEFT VENTRICLE PLAX 2D LVIDd:  4.50 cm   Diastology LVIDs:         3.30 cm   LV e'  medial:    6.20 cm/s LV PW:         0.90 cm   LV E/e' medial:  12.8 LV IVS:        0.80 cm   LV e' lateral:   8.59 cm/s LVOT diam:     2.30 cm   LV E/e' lateral: 9.2 LV SV:         84 LV SV Index:   40 LVOT Area:     4.15 cm  RIGHT VENTRICLE TAPSE (M-mode): 2.3 cm LEFT ATRIUM             Index        RIGHT ATRIUM           Index LA diam:        3.00 cm 1.43 cm/m   RA Area:     14.50 cm LA Vol (A2C):   43.4 ml 20.66 ml/m  RA Volume:   31.70 ml  15.09 ml/m LA Vol (A4C):   52.3 ml 24.89 ml/m LA Biplane Vol: 48.7 ml 23.18 ml/m  AORTIC VALVE AV Area (Vmax):    3.09 cm AV Area (Vmean):   3.13 cm AV Area (VTI):     3.41 cm AV Vmax:           111.00 cm/s AV Vmean:          74.700 cm/s AV VTI:            0.247 m AV Peak Grad:      4.9 mmHg AV Mean Grad:      3.0 mmHg LVOT Vmax:         82.50 cm/s LVOT Vmean:        56.200 cm/s LVOT VTI:          0.203 m LVOT/AV VTI ratio: 0.82  AORTA Ao Root diam: 3.30 cm MITRAL VALVE MV Area (PHT): 3.37 cm    SHUNTS MV Decel Time: 225 msec    Systemic VTI:  0.20 m MV E velocity: 79.10 cm/s  Systemic Diam: 2.30 cm MV A velocity: 97.00 cm/s MV E/A ratio:  0.82 Mary Scientist, physiological signed by Phineas Inches Signature Date/Time: 11/17/2021/2:02:53 PM    Final    DG Pelvis 1-2 Views  Result Date: 11/17/2021 CLINICAL DATA:  Preoperative, right hip fracture EXAM: RIGHT FEMUR 2 VIEWS; PELVIS - 1-2 VIEW COMPARISON:  11/15/2021 FINDINGS: Unchanged, angulated and impacted basicervical fracture of the right femoral neck. No additional fracture or dislocation of the distal right femur, pelvis or proximal left femur seen in single frontal view. Vascular calcinosis. IMPRESSION: 1. Unchanged, angulated and impacted basicervical fracture of the right femoral neck. 2. No additional fracture or dislocation of the distal right femur, pelvis or proximal left femur seen in single frontal view. Electronically Signed   By: Delanna Ahmadi M.D.   On: 11/17/2021 11:58   DG FEMUR, MIN 2 VIEWS  RIGHT  Result Date: 11/17/2021 CLINICAL DATA:  Preoperative, right hip fracture EXAM: RIGHT FEMUR 2 VIEWS; PELVIS - 1-2 VIEW COMPARISON:  11/15/2021 FINDINGS: Unchanged, angulated and impacted basicervical fracture of the right femoral neck. No additional fracture or dislocation of the distal right femur, pelvis or proximal left femur seen in single frontal view. Vascular calcinosis. IMPRESSION: 1. Unchanged, angulated and impacted basicervical fracture of the right femoral neck. 2. No additional fracture or dislocation of the distal  right femur, pelvis or proximal left femur seen in single frontal view. Electronically Signed   By: Delanna Ahmadi M.D.   On: 11/17/2021 11:58   DG CHEST PORT 1 VIEW  Result Date: 11/16/2021 CLINICAL DATA:  Shortness of breath. EXAM: PORTABLE CHEST 1 VIEW COMPARISON:  11/15/2021. FINDINGS: The heart size and mediastinal contours are stable. There is atherosclerotic calcification of the aorta. Emphysematous changes are present in the lungs. Coarse interstitial markings are noted bilaterally. Patchy airspace opacities are noted in the mid to lower lung fields bilaterally. No effusion or pneumothorax. No acute osseous abnormality. IMPRESSION: 1. Scattered airspace opacities in the mid to lower lung fields bilaterally, possible edema or infiltrate. 2. Emphysema. 3. Aortic atherosclerosis. Electronically Signed   By: Brett Fairy M.D.   On: 11/16/2021 20:39   Labs:   Basic Metabolic Panel: Recent Labs  Lab 11/16/21 2025 11/16/21 2227 11/19/21 0309 11/20/21 0355 11/21/21 0118  NA 136 136 139 136 140  K 5.0 4.3 3.5 3.9 4.8  CL 103 104 103 110 108  CO2 '27 23 27 22 '$ 21*  GLUCOSE 121* 121* 125* 117* 122*  BUN 29* 29* 40* 32* 33*  CREATININE 1.28* 1.20 1.37* 1.19 1.33*  CALCIUM 9.1 8.9 9.2 8.1* 8.3*     CBC: Recent Labs  Lab 11/16/21 2025 11/16/21 2227 11/19/21 0309 11/20/21 0355 11/21/21 0118  WBC 14.5* 13.9* 9.0 8.5 9.3  NEUTROABS 12.4*  --   --   --   --    HGB 15.9 16.1 14.7 13.3 13.0  HCT 45.4 45.4 41.5 38.8* 37.5*  MCV 99.6 99.6 98.3 99.7 100.3*  PLT 145* 137* 133* 108* 103*         SIGNED:   Debbe Odea, MD  [facility] 11/21/2021, 12:37 PM

## 2021-11-21 NOTE — Addendum Note (Signed)
Addendum  created 11/21/21 0851 by Josephine Igo, CRNA   Order list changed, Pharmacy for encounter modified

## 2021-11-21 NOTE — TOC Transition Note (Signed)
Transition of Care [facility]) - CM/SW Discharge Note   Patient Details  Name: Nicholas [name] MRN: [id number] Date of Birth: 01/04/1951  Transition of Care [facility]) CM/SW Contact:  Bartholomew Crews, RN Phone Number: [phone] 11/21/2021, 1:49 PM   Clinical Narrative:     Spoke with patient at the bedside to discuss post acute transition. Patient is agreeable to [facility] PT. Choice of agency offered. [facility] Lemmings accepted referral. [place] orders in place. Patient has transportation home. No further TOC needs identified at this time.   Final next level of care: Home w Home Health Services Barriers to Discharge: No Barriers Identified   Patient Goals and CMS Choice   CMS Medicare.gov Compare Post Acute Care list provided to:: Patient Choice offered to / list presented to : Patient  Discharge Placement                       Discharge Plan and Services                DME Arranged: N/A DME Agency: NA       HH Arranged: PT HH Agency: [place] Date [place]: 11/21/21 Time [place]: [phone] Representative spoke with at [place]: [place] Determinants of Health ([place]) Interventions Housing Interventions: Intervention Not Indicated   Readmission Risk Interventions     No data to display

## 2021-11-21 NOTE — Progress Notes (Signed)
Patient ID: Nicholas [name], male   DOB: [birth year], 71 y.o.   MRN: [id number] The patient is awake and alert this morning.  He is making good progress from a mobility standpoint and physical therapy is actually working with him now.  They state that he is a good candidate for transition to home with home health PT.  He has been up with a walker.  He is back on Plavix.  I did change his right hip dressing at the bedside.  From an orthopedic standpoint he can be discharged to home.

## 2021-11-21 NOTE — Progress Notes (Signed)
Physical Therapy Treatment Patient Details Name: Nicholas [name] MRN: [id number] DOB: 03/31/1950 Today's Date: 11/21/2021   History of Present Illness 71 year old male who presents to [facility] ED 9/27 after fall, found to have R hip fx. Transferred to [facility] and is s/p 9/29 THA. PMH: COPD who continues to smoke, HIV, coronary artery disease status post stenting, CKD stage III and hypertension.    PT Comments    Patient is making progress with functional independence. Gait training performed with cues for rolling walker and BLE sequencing with good carry over demonstrated. Therapeutic exercises initiated and performed from the chair. Sp02 remained in the 90's with ambulation on room air. He does desaturate to 84% briefly after walking and quickly increased back to the 90's with cues for breathing techniques. He is hopeful to discharge home today with the assistance from his sister. Recommend HHPT follow up at discharge with intermittent supervision/assistance from family as needed.    Recommendations for follow up therapy are one component of a multi-disciplinary discharge planning process, led by the attending physician.  Recommendations may be updated based on patient status, additional functional criteria and insurance authorization.  Follow Up Recommendations  Home health PT     Assistance Recommended at Discharge Intermittent Supervision/Assistance  Patient can return home with the following A little help with walking and/or transfers;A little help with bathing/dressing/bathroom;Assistance with cooking/housework;Assist for transportation;Help with stairs or ramp for entrance   Equipment Recommendations  Rolling walker (2 wheels)    Recommendations for Other Services       Precautions / Restrictions Precautions Precautions: Fall Restrictions Weight Bearing Restrictions: Yes RLE Weight Bearing: Weight bearing as tolerated     Mobility  Bed Mobility               General  bed mobility comments: not assessed as patient sitting up on arrival and post session    Transfers Overall transfer level: Needs assistance Equipment used: Rolling walker (2 wheels) Transfers: Sit to/from Stand Sit to Stand: Min guard           General transfer comment: verbal cues for safety and hand placement    Ambulation/Gait Ambulation/Gait assistance: Min guard Gait Distance (Feet): 75 Feet Assistive device: Rolling walker (2 wheels) Gait Pattern/deviations: Step-to pattern, Decreased stride length, Antalgic Gait velocity: decreased     General Gait Details: verbal cues for RW and BLE sequencing. encouraged step through pattern and pain decreases. Sp02 remained in the 90's on room air with walking   Stairs             Wheelchair Mobility    Modified Rankin (Stroke Patients Only)       Balance                                            Cognition Arousal/Alertness: Awake/alert Behavior During Therapy: WFL for tasks assessed/performed Overall Cognitive Status: No family/caregiver present to determine baseline cognitive functioning                               Problem Solving: Slow processing          Exercises Total Joint Exercises Ankle Circles/Pumps: AROM, Strengthening, Both, 5 reps, Seated Quad Sets: AROM, Strengthening, Right, 5 reps, Seated Gluteal Sets: AROM, Strengthening, Both, 5 reps, Seated Short Arc Quad: Strengthening, AROM, Right, 5  reps, Seated Hip ABduction/ADduction: AROM, Strengthening, Right, 10 reps, Seated Long Arc Quad: AROM, Strengthening, Right, 10 reps, Seated Other Exercises Other Exercises: verbal and tactile cues for exercise technique for strengthening    General Comments General comments (skin integrity, edema, etc.): after walking, Sp02 decreased to 84% briefly. cues for breathing techniques provided and Sp02 increased back to 90's within 90 seconds. of note, when patient is using  his incentive spirometer, Sp02 increased to 97%.      Pertinent Vitals/Pain Pain Assessment Pain Assessment: 0-10 Pain Score: 8  Pain Location: R hip incision Pain Descriptors / Indicators: Operative site guarding Pain Intervention(s): Limited activity within patient's tolerance, Monitored during session (ice pack reapplied at end of session)    Home Living Family/patient expects to be discharged to:: Private residence Living Arrangements: Alone Available Help at Discharge: Family;Available 24 hours/day (sisters) Type of Home: Apartment Home Access: Level entry       Home Layout: One level Home Equipment: Grab bars - tub/shower;Hand held shower head;Shower seat      Prior Function            PT Goals (current goals can now be found in the care plan section) Acute Rehab PT Goals Patient Stated Goal: go home PT Goal Formulation: With patient Time For Goal Achievement: 12/03/21 Potential to Achieve Goals: Fair Progress towards PT goals: Progressing toward goals    Frequency    Min 3X/week      PT Plan Current plan remains appropriate    Co-evaluation              AM-PAC PT "6 Clicks" Mobility   Outcome Measure  Help needed turning from your back to your side while in a flat bed without using bedrails?: A Little Help needed moving from lying on your back to sitting on the side of a flat bed without using bedrails?: A Lot Help needed moving to and from a bed to a chair (including a wheelchair)?: A Little Help needed standing up from a chair using your arms (e.g., wheelchair or bedside chair)?: A Little Help needed to walk in hospital room?: A Little Help needed climbing 3-5 steps with a railing? : Total 6 Click Score: 15    End of Session   Activity Tolerance: Patient tolerated treatment well Patient left: in chair;with call bell/phone within reach;with chair alarm set (ice pack applied R hip) Nurse Communication: Mobility status PT Visit Diagnosis:  Unsteadiness on feet (R26.81);Other abnormalities of gait and mobility (R26.89);Muscle weakness (generalized) (M62.81);History of falling (Z91.81);Difficulty in walking, not elsewhere classified (R26.2);Pain Pain - Right/Left: Right Pain - part of body: Hip     Time: 1133-1201 PT Time Calculation (min) (ACUTE ONLY): 28 min  Charges:  $Gait Training: 8-22 mins $Therapeutic Exercise: 8-22 mins                     Minna Merritts, PT, MPT    Percell Locus 11/21/2021, 2:04 PM

## 2021-11-22 ENCOUNTER — Other Ambulatory Visit: Payer: Self-pay | Admitting: Internal Medicine

## 2021-11-22 DIAGNOSIS — B20 Human immunodeficiency virus [HIV] disease: Secondary | ICD-10-CM

## 2021-11-23 DIAGNOSIS — J4489 Other specified chronic obstructive pulmonary disease: Secondary | ICD-10-CM | POA: Diagnosis not present

## 2021-11-23 DIAGNOSIS — I2510 Atherosclerotic heart disease of native coronary artery without angina pectoris: Secondary | ICD-10-CM | POA: Diagnosis not present

## 2021-11-23 DIAGNOSIS — I959 Hypotension, unspecified: Secondary | ICD-10-CM | POA: Diagnosis not present

## 2021-11-23 DIAGNOSIS — J9601 Acute respiratory failure with hypoxia: Secondary | ICD-10-CM | POA: Diagnosis not present

## 2021-11-23 DIAGNOSIS — J432 Centrilobular emphysema: Secondary | ICD-10-CM | POA: Diagnosis not present

## 2021-11-23 DIAGNOSIS — N183 Chronic kidney disease, stage 3 unspecified: Secondary | ICD-10-CM | POA: Diagnosis not present

## 2021-11-23 DIAGNOSIS — I700 Atherosclerosis of aorta: Secondary | ICD-10-CM | POA: Diagnosis not present

## 2021-11-23 DIAGNOSIS — S72091D Other fracture of head and neck of right femur, subsequent encounter for closed fracture with routine healing: Secondary | ICD-10-CM | POA: Diagnosis not present

## 2021-11-23 DIAGNOSIS — I129 Hypertensive chronic kidney disease with stage 1 through stage 4 chronic kidney disease, or unspecified chronic kidney disease: Secondary | ICD-10-CM | POA: Diagnosis not present

## 2021-11-28 ENCOUNTER — Telehealth: Payer: Self-pay

## 2021-11-28 ENCOUNTER — Ambulatory Visit: Payer: Medicare HMO | Admitting: "Endocrinology

## 2021-11-28 NOTE — Telephone Encounter (Signed)
[facility] for patient that I scheduled his appt for next Monday with Artis Delay

## 2021-11-28 NOTE — Telephone Encounter (Signed)
Patient called triage to schedule apt with Dr. Ninfa Linden for a 2 week follow up from surgery. I was unsure where to put him so I told him we would give him a call back.

## 2021-12-05 ENCOUNTER — Ambulatory Visit (INDEPENDENT_AMBULATORY_CARE_PROVIDER_SITE_OTHER): Payer: Medicare HMO

## 2021-12-05 ENCOUNTER — Encounter: Payer: Self-pay | Admitting: Physician Assistant

## 2021-12-05 ENCOUNTER — Ambulatory Visit (INDEPENDENT_AMBULATORY_CARE_PROVIDER_SITE_OTHER): Payer: Medicare HMO | Admitting: Physician Assistant

## 2021-12-05 DIAGNOSIS — M25571 Pain in right ankle and joints of right foot: Secondary | ICD-10-CM

## 2021-12-05 DIAGNOSIS — L03115 Cellulitis of right lower limb: Secondary | ICD-10-CM

## 2021-12-05 DIAGNOSIS — Z96641 Presence of right artificial hip joint: Secondary | ICD-10-CM

## 2021-12-05 MED ORDER — DOXYCYCLINE HYCLATE 100 MG PO TABS: 100.0000 mg | ORAL_TABLET | Freq: Two times a day (BID) | ORAL | 0 refills | Status: AC

## 2021-12-05 NOTE — Progress Notes (Signed)
Office Visit Note   Patient: Nicholas [name]           Date of Birth: 1950/07/13           MRN: [id number] Visit Date: 12/05/2021              Requested by: [place] Nation, MD [place],  [place] [id number] PCP: [place] Nation, MD   Assessment & Plan: Visit Diagnoses:  1. Acute right ankle pain   2. Cellulitis of right lower limb   3. Status post total replacement of right hip     Plan: Bilateral legs are wrapped with Profore wraps.  He will follow-up with Dr. Sharol Given in 1 week.  Follow-up with Dr. Ninfa Linden and myself in 2 weeks.  Staples were harvested.  Steri-Strips applied.  Aspiration of seroma yielded 52 cc of serosanguineous fluid.  He was placed on doxycycline due to the cellulitis involving the leg right lower leg.  Follow-Up Instructions: Return Dr. Sharol Given one week, Dr. Ninfa Linden 2 weeks.   Orders:  Orders Placed This Encounter  Procedures   XR FEMUR, MIN 2 VIEWS RIGHT   XR Ankle Complete Right   Meds ordered this encounter  Medications   doxycycline (VIBRA-TABS) 100 MG tablet    Sig: Take 1 tablet (100 mg total) by mouth 2 (two) times daily for 14 days.    Dispense:  28 tablet    Refill:  0      Procedures: No procedures performed   Clinical Data: No additional findings.   Subjective: Chief Complaint  Patient presents with   Right Leg - Pain    HPI Nicholas [name] is status post right total hip arthroplasty 11/18/2021.  He states he has had 2 falls he states he did not lose consciousness with either fall but is fallen twice and has had increased swelling in his right leg since then.  He has right ankle pain.  He has had no fevers or chills.  He is on chronic anticoagulation. Review of Systems See HPI  Objective: Vital Signs: There were no vitals taken for this visit.  Physical Exam Constitutional:      Appearance: He is not ill-appearing or diaphoretic.  Pulmonary:     Effort: Pulmonary effort is normal.  Neurological:     Mental Status:  He is alert and oriented to person, place, and time.     Ortho Exam Right hip incisions well approximated with staples no signs of infection.  Positive seroma. Bilateral lower extremity has swelling particularly of the right lower leg.  Calves are supple nontender.  He has significant edema particularly over the feet and distal tib-fib's bilaterally.  Slight erythema involving the right lower leg.  There is a well-healing sores on multiple toes bilaterally he states this is from him crawling after falling.  Right ankle tenderness over the medial malleolus.  Dorsiflexion plantarflexion bilateral ankles intact. Specialty Comments:  No specialty comments available.  Imaging: XR Ankle Complete Right  Result Date: 12/05/2021 Right ankle: 3 views: Talus located within the ankle mortise.  No diastases.  No acute fractures.  XR FEMUR, MIN 2 VIEWS RIGHT  Result Date: 12/05/2021 Right femur 2 views: No acute fractures.  Status post right total hip arthroplasty well-seated components.  Hips well located.    PMFS History: Patient Active Problem List   Diagnosis Date Noted   COPD with acute exacerbation ([place]) 11/21/2021   Thrombocytopenia ([place]) 11/21/2021   CAD (coronary artery disease) 11/21/2021  Centrilobular emphysema ([place])    Closed right hip fracture ([place]) 11/17/2021   Acute respiratory failure with hypoxia (H[facility]) 11/16/2021   Subclinical hyperthyroidism 05/27/2021   Late latent syphilis 10/19/2020   Claudication in peripheral vascular disease ([place]) 12/02/2018   Peripheral arterial disease ([place]) 11/13/2018   Hyperlipidemia LDL goal <70 01/17/2018   Coronary artery disease involving native coronary artery of native heart with unstable angina pectoris (HCC)    STEMI (ST elevation myocardial infarction) ([place]) 01/14/2018   STEMI involving left circumflex coronary artery ([place]) 01/14/2018   Dyslipidemia 12/04/2017   Cigarette smoker 06/07/2017   HTN (hypertension) 06/07/2017   History of  CVA (cerebrovascular accident) 06/07/2017   Seasonal allergies 06/07/2017   Grover's disease 06/07/2017   HIV disease ([place]) 06/06/2017   Depression 06/06/2017   CKD (chronic kidney disease) stage 3, GFR 30-59 ml/min ([place]) 06/06/2017   Past Medical History:  Diagnosis Date   CAD (coronary artery disease)    Overlapping DES x2 to the mid to distal circumflex extending into OM 3 01/14/2018, DES x2 to the mid to distal RCA 01/16/2018   COPD (chronic obstructive pulmonary disease) ([place])    Essential hypertension    Grover's disease 2017   HIV (human immunodeficiency virus infection) ([place])    Polycythemia 2017   Seasonal allergies 2017   ST elevation myocardial infarction (STEMI) of inferior wall [facility])    November 2019   Stroke (cerebrum) [facility])    Vitamin D deficiency     Family History  Problem Relation Age of Onset   Heart disease Mother     Past Surgical History:  Procedure Laterality Date   ABDOMINAL AORTOGRAM W/LOWER EXTREMITY Bilateral 12/02/2018   Procedure: ABDOMINAL AORTOGRAM W/LOWER EXTREMITY;  Surgeon: Lorretta Harp, MD;  Location: [place] [facility];  Service: Cardiovascular;  Laterality: Bilateral;   CORONARY STENT INTERVENTION N/A 01/16/2018   Procedure: CORONARY STENT INTERVENTION;  Surgeon: Troy Sine, MD;  Location: [place] [facility];  Service: Cardiovascular;  Laterality: N/A;   CORONARY/GRAFT ACUTE MI REVASCULARIZATION N/A 01/14/2018   Procedure: Coronary/Graft Acute MI Revascularization;  Surgeon: Nelva Bush, MD;  Location: [place] [facility];  Service: Cardiovascular;  Laterality: N/A;   LEFT HEART CATH N/A 01/16/2018   Procedure: Left Heart Cath;  Surgeon: Troy Sine, MD;  Location: [place] [facility];  Service: Cardiovascular;  Laterality: N/A;   LEFT HEART CATH AND CORONARY ANGIOGRAPHY N/A 01/14/2018   Procedure: LEFT HEART CATH AND CORONARY ANGIOGRAPHY;  Surgeon: Nelva Bush, MD;  Location: [place] [facility];  Service: Cardiovascular;   Laterality: N/A;   PERIPHERAL VASCULAR ATHERECTOMY  12/02/2018   Procedure: PERIPHERAL VASCULAR ATHERECTOMY;  Surgeon: Lorretta Harp, MD;  Location: [place] [facility];  Service: Cardiovascular;;  Left SFA   PERIPHERAL VASCULAR BALLOON ANGIOPLASTY  12/02/2018   Procedure: PERIPHERAL VASCULAR BALLOON ANGIOPLASTY;  Surgeon: Lorretta Harp, MD;  Location: [place] [facility];  Service: Cardiovascular;;  Left SFA   TOTAL HIP ARTHROPLASTY Right 11/18/2021   Procedure: TOTAL HIP ARTHROPLASTY ANTERIOR APPROACH;  Surgeon: Mcarthur Rossetti, MD;  Location: [place];  Service: Orthopedics;  Laterality: Right;   ULTRASOUND GUIDANCE FOR VASCULAR ACCESS  01/16/2018   Procedure: Ultrasound Guidance For Vascular Access;  Surgeon: Troy Sine, MD;  Location: [place] [facility];  Service: Cardiovascular;;   Social History   Occupational History   Not on file  Tobacco Use   Smoking status: Some Days    Packs/day: 1.00  Types: Cigarettes    Start date: 04/30/1964   Smokeless tobacco: Never   Tobacco comments:    not smoked since Thanksgiving  Vaping Use   Vaping Use: Never used  Substance and Sexual Activity   Alcohol use: Not Currently   Drug use: Not Currently    Types: Marijuana   Sexual activity: Not Currently    Comment: declined condoms

## 2021-12-06 ENCOUNTER — Other Ambulatory Visit: Payer: Self-pay | Admitting: Orthopaedic Surgery

## 2021-12-06 ENCOUNTER — Telehealth: Payer: Self-pay | Admitting: Orthopaedic Surgery

## 2021-12-06 MED ORDER — OXYCODONE HCL 5 MG PO TABS: 5.0000 mg | ORAL_TABLET | ORAL | 0 refills | Status: DC | PRN

## 2021-12-06 NOTE — Telephone Encounter (Signed)
Mark (PT) from [facility] phone number [phone] called requesting a refill of pain medication of oxycodone. Please send to [facility]. Please call pt to confirm correct [place] to send to. Pt phone number is [phone]

## 2021-12-08 MED FILL — Hydromorphone HCl Preservative Free (PF) Inj 1 MG/ML: INTRAMUSCULAR | Qty: 1 | Status: AC

## 2021-12-09 ENCOUNTER — Telehealth: Payer: Self-pay | Admitting: Orthopedic Surgery

## 2021-12-09 NOTE — Telephone Encounter (Signed)
Pt is having issues with the leg wrap, they want to remove the wraps. I transferred call to [place].

## 2021-12-09 NOTE — Telephone Encounter (Signed)
I called and sw HHN Inez Catalina she advised tha tthe pt is not elevating his legs and complaining of the bilat profore wraps hurting his heel. I advised that he should elevate and he has an appt on Monday and asked if he would be able to tolerate until then. The pt states that he would try. Advised if he could not then they could remove and apply ace to the legs. Inez Catalina advised that if the pt needs to have continued compression wraps then they can do this at home provided that there is a wound to treat. Will update on plan after appt on Monday. Cb # [phone]

## 2021-12-12 ENCOUNTER — Ambulatory Visit: Payer: Medicare HMO | Admitting: Orthopedic Surgery

## 2021-12-13 ENCOUNTER — Telehealth: Payer: Self-pay | Admitting: Orthopedic Surgery

## 2021-12-13 NOTE — Telephone Encounter (Signed)
Called pt, LMTCB.

## 2021-12-13 NOTE — Telephone Encounter (Signed)
Nicholas [name] stated that pt couldn't make his apt yesterday due to his sister's having apt's for themselves and he wasn't able to have a way here. He is scheduled on 12/23/21 but that is too far out. I will call him to see if we can get him in sooner.

## 2021-12-13 NOTE — Telephone Encounter (Signed)
SW [place], I told her I have not heard back from pt yet. I asked if she can go out to see him tomorrow to take off his profore dressings and apply gauze and ACE bandages until she can either get profore dressings and apply them and/or we can get him in to evaluate him.

## 2021-12-13 NOTE — Telephone Encounter (Signed)
Pt was supposed to come into to appointment yesterday but no showed this. We were going to take off his bilateral compression dressings and evaluate on further dressing changes. I called Betty, LMTCB.

## 2021-12-13 NOTE — Telephone Encounter (Signed)
Inez Catalina Investment banker, corporate) from [facility] home health called for nursing wound orders. Inez Catalina states pt's right leg wraps ar soaked. Please call Inez Catalina at [phone].

## 2021-12-14 ENCOUNTER — Telehealth: Payer: Self-pay | Admitting: Orthopedic Surgery

## 2021-12-14 NOTE — Telephone Encounter (Signed)
Pt called returning call to [place]. Please call pt at [phone].

## 2021-12-14 NOTE — Telephone Encounter (Signed)
[name], Nicholas L Just now (11:14 AM)   CB Pt called returning call to [place]. Please call pt at [phone].

## 2021-12-14 NOTE — Telephone Encounter (Signed)
SW pt, he says nursing came out today to rewrap his legs. They will monitor him weekly. He has an appt for f/u on 12/23/21

## 2021-12-14 NOTE — Telephone Encounter (Signed)
See previous open note. Will close this out (duplicate)

## 2021-12-19 ENCOUNTER — Other Ambulatory Visit: Payer: Self-pay | Admitting: Internal Medicine

## 2021-12-19 ENCOUNTER — Ambulatory Visit: Payer: Medicare HMO | Admitting: Physician Assistant

## 2021-12-19 DIAGNOSIS — B20 Human immunodeficiency virus [HIV] disease: Secondary | ICD-10-CM

## 2021-12-19 NOTE — Telephone Encounter (Signed)
Message sent to patient on my chart. Overdue for office follow up.

## 2021-12-21 ENCOUNTER — Telehealth: Payer: Self-pay | Admitting: Cardiology

## 2021-12-21 MED ORDER — CARVEDILOL 6.25 MG PO TABS: 6.2500 mg | ORAL_TABLET | Freq: Two times a day (BID) | ORAL | 3 refills | Status: DC

## 2021-12-21 NOTE — Telephone Encounter (Signed)
Done

## 2021-12-21 NOTE — Telephone Encounter (Signed)
*  STAT* If patient is at the pharmacy, call can be transferred to refill team.   1. Which medications need to be refilled? (please list name of each medication and dose if known) Carvedilol  2. Which pharmacy/location (including street and city if local pharmacy) is medication to be sent to? [facility]  [place]  3. Do they need a 30 day or 90 day supply? -30 days # 60 and refills

## 2021-12-23 ENCOUNTER — Encounter: Payer: Self-pay | Admitting: Family

## 2021-12-23 ENCOUNTER — Ambulatory Visit: Payer: Medicare HMO | Admitting: Family

## 2021-12-23 DIAGNOSIS — L97828 Non-pressure chronic ulcer of other part of left lower leg with other specified severity: Secondary | ICD-10-CM

## 2021-12-23 DIAGNOSIS — R600 Localized edema: Secondary | ICD-10-CM | POA: Diagnosis not present

## 2021-12-23 DIAGNOSIS — L97818 Non-pressure chronic ulcer of other part of right lower leg with other specified severity: Secondary | ICD-10-CM

## 2021-12-23 DIAGNOSIS — L03115 Cellulitis of right lower limb: Secondary | ICD-10-CM | POA: Diagnosis not present

## 2021-12-23 NOTE — Progress Notes (Unsigned)
Office Visit Note   Patient: Nicholas [name]           Date of Birth: 1950-05-06           MRN: [id number] Visit Date: 12/23/2021              Requested by: Sh[place]bury Nation, MD [place],  [place] [address] PCP: [place] Nation, MD  No chief complaint on file.     HPI: The patient is a 71 year old gentleman seen in follow-up he did have issues with cellulitis to his right lower extremity with ulceration he has been in a Profore dressing to the right lower extremity they were unable to continue wrapping the left as he did not have an ulcer at the time of their visit  Has completed a course of doxycycline  Was seen by our office for the same a few weeks ago  Denies history of heart failure.  Has not seen cardiology recently.  Assessment & Plan: Visit Diagnoses: No diagnosis found.  Plan: Pleased with resolution of cellulitis we will begin once weekly Dynaflex compression wraps bilateral lower extremities for significant edema and ulcers. Consider follow-up with cardiology for significant bilateral lower extremity edema up to the thighs  Follow-Up Instructions: No follow-ups on file.   Ortho Exam  Patient is alert, oriented, no adenopathy, well-dressed, normal affect, normal respiratory effort. On examination bilateral lower extremities there is wrinkling of the skin on the right from compression wrapping.  Has scattered ulcerations over the dorsum of the toes on the right from abrasions from crawling.  These are improved.  Over the anterior shin there is a 2 cm in diameter ulcer with granulation there is no surrounding erythema warmth no purulence.  The left lower extremity with hemosiderin staining and mild erythema there is no warmth or cellulitis.  He does have significant pitting edema on the left there is a posterior blistered ulceration that is 4 cm in diameter  Imaging: No results found. No images are attached to the encounter.  Labs: Lab Results   Component Value Date   HGBA1C 4.8 01/14/2018     Lab Results  Component Value Date   ALBUMIN 2.7 (L) 11/16/2021   ALBUMIN 4.5 04/23/2018   ALBUMIN 2.8 (L) 01/17/2018    No results found for: "MG" No results found for: "VD25OH"  No results found for: "PREALBUMIN"    Latest Ref Rng & Units 11/21/2021    1:18 AM 11/20/2021    3:55 AM 11/19/2021    3:09 AM  CBC EXTENDED  WBC 4.0 - 10.5 K/uL 9.3  8.5  9.0   RBC 4.22 - 5.81 MIL/uL 3.74  3.89  4.22   Hemoglobin 13.0 - 17.0 g/dL 13.0  13.3  14.7   HCT 39.0 - 52.0 % 37.5  38.8  41.5   Platelets 150 - 400 K/uL 103  108  133      There is no height or weight on file to calculate BMI.  Orders:  No orders of the defined types were placed in this encounter.  No orders of the defined types were placed in this encounter.    Procedures: No procedures performed  Clinical Data: No additional findings.  ROS:  All other systems negative, except as noted in the HPI. Review of Systems  Constitutional:  Negative for chills and fever.  Respiratory:  Negative for shortness of breath.   Cardiovascular:  Positive for leg swelling. Negative for chest pain.  Skin:  Positive for wound. Negative for color change.    Objective: Vital Signs: There were no vitals taken for this visit.  Specialty Comments:  No specialty comments available.  PMFS History: Patient Active Problem List   Diagnosis Date Noted   COPD with acute exacerbation ([place]) 11/21/2021   Thrombocytopenia ([place]) 11/21/2021   CAD (coronary artery disease) 11/21/2021   Centrilobular emphysema ([place])    Closed right hip fracture ([place]) 11/17/2021   Acute respiratory failure with hypoxia ([place]) 11/16/2021   Subclinical hyperthyroidism 05/27/2021   Late latent syphilis 10/19/2020   Claudication in peripheral vascular disease ([place]) 12/02/2018   Peripheral arterial disease ([place]) 11/13/2018   Hyperlipidemia LDL goal <70 01/17/2018   Coronary artery disease involving native  coronary artery of native heart with unstable angina pectoris (HCC)    STEMI (ST elevation myocardial infarction) ([place]) 01/14/2018   STEMI involving left circumflex coronary artery ([place]) 01/14/2018   Dyslipidemia 12/04/2017   Cigarette smoker 06/07/2017   HTN (hypertension) 06/07/2017   History of CVA (cerebrovascular accident) 06/07/2017   Seasonal allergies 06/07/2017   Grover's disease 06/07/2017   HIV disease ([place]) 06/06/2017   Depression 06/06/2017   CKD (chronic kidney disease) stage 3, GFR 30-59 ml/min ([place]) 06/06/2017   Past Medical History:  Diagnosis Date   CAD (coronary artery disease)    Overlapping DES x2 to the mid to distal circumflex extending into OM 3 01/14/2018, DES x2 to the mid to distal RCA 01/16/2018   COPD (chronic obstructive pulmonary disease) ([place])    Essential hypertension    Grover's disease 2017   HIV (human immunodeficiency virus infection) ([place])    Polycythemia 2017   Seasonal allergies 2017   ST elevation myocardial infarction (STEMI) of inferior wall [facility])    November 2019   Stroke (cerebrum) [facility])    Vitamin D deficiency     Family History  Problem Relation Age of Onset   Heart disease Mother     Past Surgical History:  Procedure Laterality Date   ABDOMINAL AORTOGRAM W/LOWER EXTREMITY Bilateral 12/02/2018   Procedure: ABDOMINAL AORTOGRAM W/LOWER EXTREMITY;  Surgeon: Lorretta Harp, MD;  Location: [facility];  Service: Cardiovascular;  Laterality: Bilateral;   CORONARY STENT INTERVENTION N/A 01/16/2018   Procedure: CORONARY STENT INTERVENTION;  Surgeon: Troy Sine, MD;  Location: [facility];  Service: Cardiovascular;  Laterality: N/A;   CORONARY/GRAFT ACUTE MI REVASCULARIZATION N/A 01/14/2018   Procedure: Coronary/Graft Acute MI Revascularization;  Surgeon: Nelva Bush, MD;  Location: [facility];  Service: Cardiovascular;  Laterality: N/A;   LEFT HEART CATH N/A 01/16/2018   Procedure: Left Heart Cath;   Surgeon: Troy Sine, MD;  Location: [facility];  Service: Cardiovascular;  Laterality: N/A;   LEFT HEART CATH AND CORONARY ANGIOGRAPHY N/A 01/14/2018   Procedure: LEFT HEART CATH AND CORONARY ANGIOGRAPHY;  Surgeon: Nelva Bush, MD;  Location: [facility];  Service: Cardiovascular;  Laterality: N/A;   PERIPHERAL VASCULAR ATHERECTOMY  12/02/2018   Procedure: PERIPHERAL VASCULAR ATHERECTOMY;  Surgeon: Lorretta Harp, MD;  Location: [facility];  Service: Cardiovascular;;  Left SFA   PERIPHERAL VASCULAR BALLOON ANGIOPLASTY  12/02/2018   Procedure: PERIPHERAL VASCULAR BALLOON ANGIOPLASTY;  Surgeon: Lorretta Harp, MD;  Location: [facility];  Service: Cardiovascular;;  Left SFA   TOTAL HIP ARTHROPLASTY Right 11/18/2021   Procedure: TOTAL HIP ARTHROPLASTY ANTERIOR APPROACH;  Surgeon: Mcarthur Rossetti, MD;  Location: [facility];  Service: Orthopedics;  Laterality: Right;   ULTRASOUND GUIDANCE FOR VASCULAR ACCESS  01/16/2018   Procedure: Ultrasound Guidance For Vascular Access;  Surgeon: Troy Sine, MD;  Location: [facility];  Service: Cardiovascular;;   Social History   Occupational History   Not on file  Tobacco Use   Smoking status: Some Days    Packs/day: 1.00    Types: Cigarettes    Start date: 04/30/1964   Smokeless tobacco: Never   Tobacco comments:    not smoked since Thanksgiving  Vaping Use   Vaping Use: Never used  Substance and Sexual Activity   Alcohol use: Not Currently   Drug use: Not Currently    Types: Marijuana   Sexual activity: Not Currently    Comment: declined condoms

## 2021-12-27 ENCOUNTER — Telehealth: Payer: Self-pay | Admitting: Cardiology

## 2021-12-27 ENCOUNTER — Telehealth: Payer: Self-pay | Admitting: Radiology

## 2021-12-27 DIAGNOSIS — S72091D Other fracture of head and neck of right femur, subsequent encounter for closed fracture with routine healing: Secondary | ICD-10-CM | POA: Diagnosis not present

## 2021-12-27 DIAGNOSIS — J9601 Acute respiratory failure with hypoxia: Secondary | ICD-10-CM | POA: Diagnosis not present

## 2021-12-27 DIAGNOSIS — N183 Chronic kidney disease, stage 3 unspecified: Secondary | ICD-10-CM | POA: Diagnosis not present

## 2021-12-27 DIAGNOSIS — I2510 Atherosclerotic heart disease of native coronary artery without angina pectoris: Secondary | ICD-10-CM | POA: Diagnosis not present

## 2021-12-27 DIAGNOSIS — J4489 Other specified chronic obstructive pulmonary disease: Secondary | ICD-10-CM | POA: Diagnosis not present

## 2021-12-27 DIAGNOSIS — I129 Hypertensive chronic kidney disease with stage 1 through stage 4 chronic kidney disease, or unspecified chronic kidney disease: Secondary | ICD-10-CM | POA: Diagnosis not present

## 2021-12-27 DIAGNOSIS — I959 Hypotension, unspecified: Secondary | ICD-10-CM | POA: Diagnosis not present

## 2021-12-27 DIAGNOSIS — I700 Atherosclerosis of aorta: Secondary | ICD-10-CM | POA: Diagnosis not present

## 2021-12-27 DIAGNOSIS — J432 Centrilobular emphysema: Secondary | ICD-10-CM | POA: Diagnosis not present

## 2021-12-27 NOTE — Telephone Encounter (Signed)
Nurse made aware. States that patient has been taking in addition to the carvedilol, losartan 25 mg once day. Wants to make Dr. Domenic Polite aware that patient takes Plavix 75 mg once a day, atorvastatin 20 mg once a day and a 81 mg aspirin. States that pulse readings have been between 78 and 96.

## 2021-12-27 NOTE — Telephone Encounter (Signed)
Pt c/o BP issue: STAT if pt c/o blurred vision, one-sided weakness or slurred speech  1. What are your last 5 BP readings?  180/72 128/74 130/70 150/70  2. Are you having any other symptoms (ex. Dizziness, headache, blurred vision, passed out)?  No   3. What is your BP issue? Patient believes when he was in hospital last they reduced his carvedilol and believes this could be the cause of his BP going up. Nurse would like a call back to discuss this.

## 2021-12-27 NOTE — Telephone Encounter (Signed)
lmtcb

## 2021-12-27 NOTE — Telephone Encounter (Signed)
Nurse with [facility] called with a log of patients BP readings. States that before the patient was admitted to [facility] back in October, his carvedilol was 25 mg twice a day and when he was discharged, was decreased to 6.25 mg twice a day. Wants to know if this is the possible cause of the increase in BP and if there needs to be an increase? Please advise

## 2021-12-28 ENCOUNTER — Telehealth: Payer: Self-pay | Admitting: Family

## 2021-12-28 MED ORDER — CARVEDILOL 12.5 MG PO TABS: 12.5000 mg | ORAL_TABLET | Freq: Two times a day (BID) | ORAL | 0 refills | Status: DC

## 2021-12-28 NOTE — Telephone Encounter (Signed)
Orders were signed yesterday and faxed by medical records.

## 2021-12-28 NOTE — Telephone Encounter (Signed)
See previous telephone message, sending new orders to [facility] for BLE

## 2021-12-28 NOTE — Telephone Encounter (Signed)
Faxed new orders for BLE wraps once a week

## 2021-12-28 NOTE — Telephone Encounter (Signed)
Spoke to [place] with [facility] who states that she will be out to see the patient tomorrow and will inform of the change in carvedilol to 12.5 mg twice a day. Also told her to have patient call the office or send a MyChart message in 2 weeks with a log of his blood pressure readings. Verbalized understanding.

## 2021-12-28 NOTE — Telephone Encounter (Signed)
Bilateral venous insufficiency with ulcer and inflammation Has ulcer on left too

## 2021-12-28 NOTE — Telephone Encounter (Signed)
Nicholas [name] with [facility] returning call. Phone: [phone]

## 2021-12-30 DIAGNOSIS — J441 Chronic obstructive pulmonary disease with (acute) exacerbation: Secondary | ICD-10-CM | POA: Diagnosis not present

## 2021-12-30 DIAGNOSIS — R609 Edema, unspecified: Secondary | ICD-10-CM | POA: Diagnosis not present

## 2021-12-30 DIAGNOSIS — I2510 Atherosclerotic heart disease of native coronary artery without angina pectoris: Secondary | ICD-10-CM | POA: Diagnosis not present

## 2021-12-30 DIAGNOSIS — R059 Cough, unspecified: Secondary | ICD-10-CM | POA: Diagnosis not present

## 2021-12-30 DIAGNOSIS — N183 Chronic kidney disease, stage 3 unspecified: Secondary | ICD-10-CM | POA: Diagnosis not present

## 2021-12-30 DIAGNOSIS — I87313 Chronic venous hypertension (idiopathic) with ulcer of bilateral lower extremity: Secondary | ICD-10-CM | POA: Diagnosis not present

## 2021-12-30 DIAGNOSIS — I110 Hypertensive heart disease with heart failure: Secondary | ICD-10-CM | POA: Diagnosis not present

## 2021-12-30 DIAGNOSIS — J439 Emphysema, unspecified: Secondary | ICD-10-CM | POA: Diagnosis not present

## 2021-12-30 DIAGNOSIS — M79662 Pain in left lower leg: Secondary | ICD-10-CM | POA: Diagnosis not present

## 2021-12-30 DIAGNOSIS — M7989 Other specified soft tissue disorders: Secondary | ICD-10-CM | POA: Diagnosis not present

## 2021-12-30 DIAGNOSIS — J9621 Acute and chronic respiratory failure with hypoxia: Secondary | ICD-10-CM | POA: Diagnosis not present

## 2021-12-30 DIAGNOSIS — J189 Pneumonia, unspecified organism: Secondary | ICD-10-CM | POA: Diagnosis not present

## 2021-12-30 DIAGNOSIS — Z720 Tobacco use: Secondary | ICD-10-CM | POA: Diagnosis not present

## 2021-12-30 DIAGNOSIS — I959 Hypotension, unspecified: Secondary | ICD-10-CM | POA: Diagnosis not present

## 2021-12-30 DIAGNOSIS — Z9981 Dependence on supplemental oxygen: Secondary | ICD-10-CM | POA: Diagnosis not present

## 2021-12-30 DIAGNOSIS — R079 Chest pain, unspecified: Secondary | ICD-10-CM | POA: Diagnosis not present

## 2021-12-30 DIAGNOSIS — T68XXXA Hypothermia, initial encounter: Secondary | ICD-10-CM | POA: Diagnosis not present

## 2021-12-30 DIAGNOSIS — J4489 Other specified chronic obstructive pulmonary disease: Secondary | ICD-10-CM | POA: Diagnosis not present

## 2021-12-30 DIAGNOSIS — R600 Localized edema: Secondary | ICD-10-CM | POA: Diagnosis not present

## 2021-12-30 DIAGNOSIS — Z792 Long term (current) use of antibiotics: Secondary | ICD-10-CM | POA: Diagnosis not present

## 2021-12-30 DIAGNOSIS — I739 Peripheral vascular disease, unspecified: Secondary | ICD-10-CM | POA: Diagnosis not present

## 2021-12-30 DIAGNOSIS — M79661 Pain in right lower leg: Secondary | ICD-10-CM | POA: Diagnosis not present

## 2021-12-30 DIAGNOSIS — R0902 Hypoxemia: Secondary | ICD-10-CM | POA: Diagnosis not present

## 2021-12-30 DIAGNOSIS — F17210 Nicotine dependence, cigarettes, uncomplicated: Secondary | ICD-10-CM | POA: Diagnosis not present

## 2021-12-30 DIAGNOSIS — L97811 Non-pressure chronic ulcer of other part of right lower leg limited to breakdown of skin: Secondary | ICD-10-CM | POA: Diagnosis not present

## 2021-12-30 DIAGNOSIS — L97529 Non-pressure chronic ulcer of other part of left foot with unspecified severity: Secondary | ICD-10-CM | POA: Diagnosis not present

## 2021-12-30 DIAGNOSIS — J449 Chronic obstructive pulmonary disease, unspecified: Secondary | ICD-10-CM | POA: Diagnosis not present

## 2021-12-30 DIAGNOSIS — I130 Hypertensive heart and chronic kidney disease with heart failure and stage 1 through stage 4 chronic kidney disease, or unspecified chronic kidney disease: Secondary | ICD-10-CM | POA: Diagnosis not present

## 2021-12-30 DIAGNOSIS — I5031 Acute diastolic (congestive) heart failure: Secondary | ICD-10-CM | POA: Diagnosis not present

## 2021-12-30 DIAGNOSIS — Z66 Do not resuscitate: Secondary | ICD-10-CM | POA: Diagnosis not present

## 2021-12-30 DIAGNOSIS — B20 Human immunodeficiency virus [HIV] disease: Secondary | ICD-10-CM | POA: Diagnosis not present

## 2021-12-30 DIAGNOSIS — L97519 Non-pressure chronic ulcer of other part of right foot with unspecified severity: Secondary | ICD-10-CM | POA: Diagnosis not present

## 2021-12-30 DIAGNOSIS — R0689 Other abnormalities of breathing: Secondary | ICD-10-CM | POA: Diagnosis not present

## 2021-12-30 DIAGNOSIS — N179 Acute kidney failure, unspecified: Secondary | ICD-10-CM | POA: Diagnosis not present

## 2021-12-30 DIAGNOSIS — L97929 Non-pressure chronic ulcer of unspecified part of left lower leg with unspecified severity: Secondary | ICD-10-CM | POA: Diagnosis not present

## 2021-12-30 DIAGNOSIS — J440 Chronic obstructive pulmonary disease with acute lower respiratory infection: Secondary | ICD-10-CM | POA: Diagnosis not present

## 2021-12-30 DIAGNOSIS — L97821 Non-pressure chronic ulcer of other part of left lower leg limited to breakdown of skin: Secondary | ICD-10-CM | POA: Diagnosis not present

## 2021-12-30 DIAGNOSIS — R7309 Other abnormal glucose: Secondary | ICD-10-CM | POA: Diagnosis not present

## 2021-12-30 DIAGNOSIS — I5033 Acute on chronic diastolic (congestive) heart failure: Secondary | ICD-10-CM | POA: Diagnosis not present

## 2021-12-30 DIAGNOSIS — Z20822 Contact with and (suspected) exposure to covid-19: Secondary | ICD-10-CM | POA: Diagnosis not present

## 2021-12-30 DIAGNOSIS — L97919 Non-pressure chronic ulcer of unspecified part of right lower leg with unspecified severity: Secondary | ICD-10-CM | POA: Diagnosis not present

## 2021-12-30 DIAGNOSIS — L97509 Non-pressure chronic ulcer of other part of unspecified foot with unspecified severity: Secondary | ICD-10-CM | POA: Diagnosis not present

## 2022-01-02 ENCOUNTER — Other Ambulatory Visit: Payer: Self-pay | Admitting: *Deleted

## 2022-01-02 MED ORDER — CARVEDILOL 12.5 MG PO TABS: 12.5000 mg | ORAL_TABLET | Freq: Two times a day (BID) | ORAL | 0 refills | Status: DC

## 2022-01-03 ENCOUNTER — Ambulatory Visit: Payer: Medicare HMO | Admitting: Cardiology

## 2022-01-06 DIAGNOSIS — I10 Essential (primary) hypertension: Secondary | ICD-10-CM | POA: Diagnosis not present

## 2022-01-06 DIAGNOSIS — D751 Secondary polycythemia: Secondary | ICD-10-CM | POA: Diagnosis not present

## 2022-01-06 DIAGNOSIS — I2510 Atherosclerotic heart disease of native coronary artery without angina pectoris: Secondary | ICD-10-CM | POA: Diagnosis not present

## 2022-01-06 DIAGNOSIS — B20 Human immunodeficiency virus [HIV] disease: Secondary | ICD-10-CM | POA: Diagnosis not present

## 2022-01-06 DIAGNOSIS — J189 Pneumonia, unspecified organism: Secondary | ICD-10-CM | POA: Diagnosis not present

## 2022-01-06 DIAGNOSIS — J449 Chronic obstructive pulmonary disease, unspecified: Secondary | ICD-10-CM | POA: Diagnosis not present

## 2022-01-06 DIAGNOSIS — N189 Chronic kidney disease, unspecified: Secondary | ICD-10-CM | POA: Diagnosis not present

## 2022-01-06 DIAGNOSIS — R600 Localized edema: Secondary | ICD-10-CM | POA: Diagnosis not present

## 2022-01-06 DIAGNOSIS — E039 Hypothyroidism, unspecified: Secondary | ICD-10-CM | POA: Diagnosis not present

## 2022-01-06 DIAGNOSIS — F17200 Nicotine dependence, unspecified, uncomplicated: Secondary | ICD-10-CM | POA: Diagnosis not present

## 2022-01-09 ENCOUNTER — Encounter: Payer: Medicare HMO | Admitting: Orthopaedic Surgery

## 2022-01-17 DIAGNOSIS — F17210 Nicotine dependence, cigarettes, uncomplicated: Secondary | ICD-10-CM | POA: Diagnosis not present

## 2022-01-17 DIAGNOSIS — R030 Elevated blood-pressure reading, without diagnosis of hypertension: Secondary | ICD-10-CM | POA: Diagnosis not present

## 2022-01-17 DIAGNOSIS — I959 Hypotension, unspecified: Secondary | ICD-10-CM | POA: Diagnosis not present

## 2022-01-17 DIAGNOSIS — Z6823 Body mass index (BMI) 23.0-23.9, adult: Secondary | ICD-10-CM | POA: Diagnosis not present

## 2022-01-20 ENCOUNTER — Ambulatory Visit: Payer: Medicare HMO | Admitting: Family

## 2022-01-24 ENCOUNTER — Other Ambulatory Visit: Payer: Self-pay | Admitting: Internal Medicine

## 2022-01-24 DIAGNOSIS — B20 Human immunodeficiency virus [HIV] disease: Secondary | ICD-10-CM

## 2022-02-02 ENCOUNTER — Ambulatory Visit (INDEPENDENT_AMBULATORY_CARE_PROVIDER_SITE_OTHER): Payer: Medicare HMO | Admitting: Internal Medicine

## 2022-02-02 ENCOUNTER — Other Ambulatory Visit: Payer: Self-pay

## 2022-02-02 ENCOUNTER — Encounter: Payer: Self-pay | Admitting: Internal Medicine

## 2022-02-02 DIAGNOSIS — D751 Secondary polycythemia: Secondary | ICD-10-CM | POA: Diagnosis not present

## 2022-02-02 DIAGNOSIS — B20 Human immunodeficiency virus [HIV] disease: Secondary | ICD-10-CM

## 2022-02-02 DIAGNOSIS — J449 Chronic obstructive pulmonary disease, unspecified: Secondary | ICD-10-CM | POA: Diagnosis not present

## 2022-02-02 DIAGNOSIS — I2510 Atherosclerotic heart disease of native coronary artery without angina pectoris: Secondary | ICD-10-CM | POA: Diagnosis not present

## 2022-02-02 DIAGNOSIS — J189 Pneumonia, unspecified organism: Secondary | ICD-10-CM | POA: Diagnosis not present

## 2022-02-02 DIAGNOSIS — I10 Essential (primary) hypertension: Secondary | ICD-10-CM | POA: Diagnosis not present

## 2022-02-02 DIAGNOSIS — N189 Chronic kidney disease, unspecified: Secondary | ICD-10-CM | POA: Diagnosis not present

## 2022-02-02 DIAGNOSIS — F17200 Nicotine dependence, unspecified, uncomplicated: Secondary | ICD-10-CM | POA: Diagnosis not present

## 2022-02-02 MED ORDER — BIKTARVY 50-200-25 MG PO TABS: 1.0000 | ORAL_TABLET | Freq: Every day | ORAL | 11 refills | Status: DC

## 2022-02-02 NOTE — Progress Notes (Signed)
Virtual Visit via Video Note  I connected with Nicholas [name] on 02/02/22 at 10:00 AM EST by a video enabled telemedicine application and verified that I am speaking with the correct person using two identifiers.  Location: Patient: Home Provider: [facility]   I discussed the limitations of evaluation and management by telemedicine and the availability of in person appointments. The patient expressed understanding and agreed to proceed.  History of Present Illness: I called and spoke with Nicholas [name] today today.  He has not had any problems obtaining, taking or tolerating his Biktarvy and says he takes it every morning and has not missed any doses.  He has had several hospitalizations recently at [facility] after he fell in the bathroom and broke his right hip.  He underwent right total hip arthroplasty in September.  He is now back home and recovering slowly.  He has completed formal physical therapy but is continuing to do exercises on his own.  He relies on his sister, Nicholas [name], to drive him to medical appointments.  She is also struggling to care for her husband who has progressive dementia.   Observations/Objective: Lab Results  Component Value Date   CD4TCELL 39 10/14/2020   CD4TABS 775 10/14/2020   HIV viral load   less than 20   10/14/2020  Assessment and Plan: [name]'s infection has been under excellent, long-term control because of his perfect adherence with Biktarvy.  He has not had blood work since August of last year but I am comfortable continuing [place] and seeing him early next year when he is able to travel to clinic.  Follow Up Instructions: Continue Biktarvy Follow-up here in about 6 weeks   I discussed the assessment and treatment plan with the patient. The patient was provided an opportunity to ask questions and all were answered. The patient agreed with the plan and demonstrated an understanding of the instructions.   The patient was advised to call back or seek an in-person evaluation if the  symptoms worsen or if the condition fails to improve as anticipated.  I provided 18 minutes of non-face-to-face time during this encounter.   Michel Bickers, MD

## 2022-02-09 DIAGNOSIS — J189 Pneumonia, unspecified organism: Secondary | ICD-10-CM | POA: Diagnosis not present

## 2022-02-09 DIAGNOSIS — J431 Panlobular emphysema: Secondary | ICD-10-CM | POA: Diagnosis not present

## 2022-02-09 DIAGNOSIS — J440 Chronic obstructive pulmonary disease with acute lower respiratory infection: Secondary | ICD-10-CM | POA: Diagnosis not present

## 2022-02-09 DIAGNOSIS — J441 Chronic obstructive pulmonary disease with (acute) exacerbation: Secondary | ICD-10-CM | POA: Diagnosis not present

## 2022-02-09 DIAGNOSIS — J432 Centrilobular emphysema: Secondary | ICD-10-CM | POA: Diagnosis not present

## 2022-02-09 DIAGNOSIS — J9621 Acute and chronic respiratory failure with hypoxia: Secondary | ICD-10-CM | POA: Diagnosis not present

## 2022-02-09 DIAGNOSIS — S72091D Other fracture of head and neck of right femur, subsequent encounter for closed fracture with routine healing: Secondary | ICD-10-CM | POA: Diagnosis not present

## 2022-02-09 DIAGNOSIS — I130 Hypertensive heart and chronic kidney disease with heart failure and stage 1 through stage 4 chronic kidney disease, or unspecified chronic kidney disease: Secondary | ICD-10-CM | POA: Diagnosis not present

## 2022-03-16 DIAGNOSIS — E119 Type 2 diabetes mellitus without complications: Secondary | ICD-10-CM | POA: Diagnosis not present

## 2022-03-16 DIAGNOSIS — S0003XA Contusion of scalp, initial encounter: Secondary | ICD-10-CM | POA: Diagnosis not present

## 2022-03-16 DIAGNOSIS — I6529 Occlusion and stenosis of unspecified carotid artery: Secondary | ICD-10-CM | POA: Diagnosis not present

## 2022-03-16 DIAGNOSIS — R404 Transient alteration of awareness: Secondary | ICD-10-CM | POA: Diagnosis not present

## 2022-03-16 DIAGNOSIS — S199XXA Unspecified injury of neck, initial encounter: Secondary | ICD-10-CM | POA: Diagnosis not present

## 2022-03-16 DIAGNOSIS — S41012A Laceration without foreign body of left shoulder, initial encounter: Secondary | ICD-10-CM | POA: Diagnosis not present

## 2022-03-16 DIAGNOSIS — I2510 Atherosclerotic heart disease of native coronary artery without angina pectoris: Secondary | ICD-10-CM | POA: Diagnosis not present

## 2022-03-16 DIAGNOSIS — R519 Headache, unspecified: Secondary | ICD-10-CM | POA: Diagnosis not present

## 2022-03-16 DIAGNOSIS — R918 Other nonspecific abnormal finding of lung field: Secondary | ICD-10-CM | POA: Diagnosis not present

## 2022-03-16 DIAGNOSIS — Z79899 Other long term (current) drug therapy: Secondary | ICD-10-CM | POA: Diagnosis not present

## 2022-03-16 DIAGNOSIS — W01198A Fall on same level from slipping, tripping and stumbling with subsequent striking against other object, initial encounter: Secondary | ICD-10-CM | POA: Diagnosis not present

## 2022-03-16 DIAGNOSIS — S40012A Contusion of left shoulder, initial encounter: Secondary | ICD-10-CM | POA: Diagnosis not present

## 2022-03-16 DIAGNOSIS — Z21 Asymptomatic human immunodeficiency virus [HIV] infection status: Secondary | ICD-10-CM | POA: Diagnosis not present

## 2022-03-16 DIAGNOSIS — J439 Emphysema, unspecified: Secondary | ICD-10-CM | POA: Diagnosis not present

## 2022-03-16 DIAGNOSIS — M47812 Spondylosis without myelopathy or radiculopathy, cervical region: Secondary | ICD-10-CM | POA: Diagnosis not present

## 2022-03-16 DIAGNOSIS — S0990XA Unspecified injury of head, initial encounter: Secondary | ICD-10-CM | POA: Diagnosis not present

## 2022-03-16 DIAGNOSIS — M25559 Pain in unspecified hip: Secondary | ICD-10-CM | POA: Diagnosis not present

## 2022-03-16 DIAGNOSIS — S0101XA Laceration without foreign body of scalp, initial encounter: Secondary | ICD-10-CM | POA: Diagnosis not present

## 2022-03-16 DIAGNOSIS — Z7984 Long term (current) use of oral hypoglycemic drugs: Secondary | ICD-10-CM | POA: Diagnosis not present

## 2022-03-24 DIAGNOSIS — Z20828 Contact with and (suspected) exposure to other viral communicable diseases: Secondary | ICD-10-CM | POA: Diagnosis not present

## 2022-03-24 DIAGNOSIS — J40 Bronchitis, not specified as acute or chronic: Secondary | ICD-10-CM | POA: Diagnosis not present

## 2022-03-24 DIAGNOSIS — Z6823 Body mass index (BMI) 23.0-23.9, adult: Secondary | ICD-10-CM | POA: Diagnosis not present

## 2022-03-24 DIAGNOSIS — J329 Chronic sinusitis, unspecified: Secondary | ICD-10-CM | POA: Diagnosis not present

## 2022-03-24 DIAGNOSIS — R609 Edema, unspecified: Secondary | ICD-10-CM | POA: Diagnosis not present

## 2022-03-24 DIAGNOSIS — R030 Elevated blood-pressure reading, without diagnosis of hypertension: Secondary | ICD-10-CM | POA: Diagnosis not present

## 2022-03-24 DIAGNOSIS — F17210 Nicotine dependence, cigarettes, uncomplicated: Secondary | ICD-10-CM | POA: Diagnosis not present

## 2022-03-28 ENCOUNTER — Ambulatory Visit: Payer: Medicare HMO | Admitting: Internal Medicine

## 2022-04-12 ENCOUNTER — Ambulatory Visit: Payer: Medicare HMO | Admitting: Internal Medicine

## 2022-04-19 ENCOUNTER — Ambulatory Visit: Payer: Medicare HMO | Admitting: Internal Medicine

## 2022-04-19 ENCOUNTER — Encounter: Payer: Self-pay | Admitting: Internal Medicine

## 2022-04-19 ENCOUNTER — Other Ambulatory Visit: Payer: Self-pay

## 2022-04-19 VITALS — BP 183/82 | HR 86 | Temp 98.1°F | Wt 187.8 lb

## 2022-04-19 DIAGNOSIS — A528 Late syphilis, latent: Secondary | ICD-10-CM | POA: Diagnosis not present

## 2022-04-19 DIAGNOSIS — J9601 Acute respiratory failure with hypoxia: Secondary | ICD-10-CM

## 2022-04-19 DIAGNOSIS — B20 Human immunodeficiency virus [HIV] disease: Secondary | ICD-10-CM

## 2022-04-19 MED ORDER — BIKTARVY 50-200-25 MG PO TABS: 1.0000 | ORAL_TABLET | Freq: Every day | ORAL | 11 refills | Status: DC

## 2022-04-19 NOTE — Progress Notes (Signed)
Patient Active Problem List   Diagnosis Date Noted   Late latent syphilis 10/19/2020    Priority: High   Coronary artery disease involving native coronary artery of native heart with unstable angina pectoris ([place])     Priority: High   HIV disease ([place]) 06/06/2017    Priority: High   COPD with acute exacerbation ([place]) 11/21/2021   Thrombocytopenia ([place]) 11/21/2021   CAD (coronary artery disease) 11/21/2021   Centrilobular emphysema ([place])    Closed right hip fracture ([place]) 11/17/2021   Acute respiratory failure with hypoxia ([place]) 11/16/2021   Subclinical hyperthyroidism 05/27/2021   Claudication in peripheral vascular disease ([place]) 12/02/2018   Peripheral arterial disease ([place]) 11/13/2018   Hyperlipidemia LDL goal <70 01/17/2018   STEMI (ST elevation myocardial infarction) ([place]) 01/14/2018   STEMI involving left circumflex coronary artery ([place]) 01/14/2018   Dyslipidemia 12/04/2017   Cigarette smoker 06/07/2017   HTN (hypertension) 06/07/2017   History of CVA (cerebrovascular accident) 06/07/2017   Seasonal allergies 06/07/2017   Grover's disease 06/07/2017   Depression 06/06/2017   CKD (chronic kidney disease) stage 3, GFR 30-59 ml/min ([place]) 06/06/2017    Patient's Medications  New Prescriptions   No medications on file  Previous Medications   ACETAMINOPHEN (TYLENOL) 325 MG TABLET    Take 325 mg by mouth daily as needed for headache.   ASPIRIN EC 81 MG TABLET    Take 1 tablet (81 mg total) by mouth daily.   ATORVASTATIN (LIPITOR) 20 MG TABLET    Take 1 tablet (20 mg total) by mouth daily.   CARVEDILOL (COREG) 12.5 MG TABLET    Take 1 tablet (12.5 mg total) by mouth 2 (two) times daily with a meal.   CLOPIDOGREL (PLAVIX) 75 MG TABLET    Take 1 tablet (75 mg total) by mouth daily.   FUROSEMIDE (LASIX) 20 MG TABLET    Take 20 mg by mouth daily.   JARDIANCE 10 MG TABS TABLET    Take 10 mg by mouth daily.   LOSARTAN (COZAAR) 25 MG TABLET    Take 25 mg by mouth daily.    NICOTINE (NICODERM CQ - DOSED IN MG/24 HOURS) 14 MG/24HR PATCH    14 mg daily.   OXYCODONE (OXY IR/ROXICODONE) 5 MG IMMEDIATE RELEASE TABLET    Take 1-2 tablets (5-10 mg total) by mouth every 4 (four) hours as needed for moderate pain (pain score 4-6).   STIOLTO RESPIMAT 2.5-2.5 MCG/ACT AERS    Inhale 1 puff into the lungs daily.   VENTOLIN HFA 108 (90 BASE) MCG/ACT INHALER    Take 3 puffs by mouth as needed for shortness of breath or wheezing.  Modified Medications   Modified Medication Previous Medication   BICTEGRAVIR-EMTRICITABINE-TENOFOVIR AF (BIKTARVY) 50-200-25 MG TABS TABLET bictegravir-emtricitabine-tenofovir AF (BIKTARVY) 50-200-25 MG TABS tablet      Take 1 tablet by mouth daily.    Take 1 tablet by mouth daily.  Discontinued Medications   No medications on file    Subjective: Nicholas [name] is in for his first visit since 2022.  He fell last year and broke his right hip requiring total hip arthroplasty in September.  He was given in-home physical therapy and has had a very slow recovery.  He had 1 fall at home suffering a scalp laceration.  He uses his walker to get around in the house.  He rarely leaves the house except for occasional doctor visits.  He relies on his sister, [name],  for transportation.  She is having increasing back problems and also has to care for her husband who has progressive dementia.  Nicholas [name] uses supplemental oxygen as needed at home.  He did not bring his oxygen with him today and became winded coming in from the car.  He has been out of his Lasix for the past week.  He says a new prescription was called into his pharmacy but has not arrived by mail yet.  He was noted increasing ankle swelling recently.  He denies any problems obtaining his Biktarvy and says he has not missed any doses.  He takes it each morning.  Review of Systems: Review of Systems  Constitutional:  Positive for weight loss. Negative for fever.  Respiratory:  Positive for shortness of breath. Negative for  cough.   Cardiovascular:  Positive for leg swelling. Negative for chest pain.  Musculoskeletal:  Positive for falls and joint pain.    Past Medical History:  Diagnosis Date   CAD (coronary artery disease)    Overlapping DES x2 to the mid to distal circumflex extending into OM 3 01/14/2018, DES x2 to the mid to distal RCA 01/16/2018   COPD (chronic obstructive pulmonary disease) ([place])    Essential hypertension    Grover's disease 2017   HIV (human immunodeficiency virus infection) ([place])    Polycythemia 2017   Seasonal allergies 2017   ST elevation myocardial infarction (STEMI) of inferior wall [facility])    November 2019   Stroke (cerebrum) [facility])    Vitamin D deficiency     Social History   Tobacco Use   Smoking status: Some Days    Packs/day: 0.50    Types: Cigarettes    Start date: 04/30/1964   Smokeless tobacco: Never  Vaping Use   Vaping Use: Never used  Substance Use Topics   Alcohol use: Yes    Comment: glass of wine with dinner   Drug use: Not Currently    Types: Marijuana    Family History  Problem Relation Age of Onset   Heart disease Mother     Allergies  Allergen Reactions   Penicillins Anaphylaxis    High fever  Did it involve swelling of the face/tongue/throat, SOB, or low BP? No Did it involve sudden or severe rash/hives, skin peeling, or any reaction on the inside of your mouth or nose? No Did you need to seek medical attention at a hospital or doctor's office? Yes When did it last happen?      45 + years If all above answers are "NO", may proceed with cephalosporin use.     Tape Other (See Comments)    Bruising of skin     Health Maintenance  Topic Date Due   COLONOSCOPY (Pts 45-22yr Insurance coverage will need to be confirmed)  Never done   Lung Cancer Screening  Never done   DTaP/Tdap/Td (1 - Tdap) 06/27/2018   Medicare Annual Wellness (AWV)  03/01/2019   Zoster Vaccines- Shingrix (2 of 2) 02/20/2020   INFLUENZA VACCINE  09/20/2021    COVID-19 Vaccine (4 - 2023-24 season) 10/21/2021   Pneumonia Vaccine 72 Years old  Completed   Hepatitis C Screening  Completed   HPV VACCINES  Aged Out    Objective:  Vitals:   04/19/22 1109 04/19/22 1112  BP: (!) 170/79 (!) 183/82  Pulse: 86   Temp: 98.1 F (36.7 C)   TempSrc: Oral   SpO2: 96%   Weight: 187 lb 12.8 oz (85.2 kg)  Body mass index is 26.57 kg/m.  Physical Exam Constitutional:      Comments: He appears weak.  His weight is down 13 pounds over the past year.  Cardiovascular:     Rate and Rhythm: Normal rate and regular rhythm.     Heart sounds: No murmur heard. Pulmonary:     Effort: Pulmonary effort is normal.     Breath sounds: Rales present. No wheezing or rhonchi.     Comments: He has been placed on supplemental oxygen here in clinic.  His room air O2 saturation was 82%. Musculoskeletal:     Right lower leg: Edema present.     Left lower leg: Edema present.  Psychiatric:        Mood and Affect: Mood normal.     Lab Results Lab Results  Component Value Date   WBC 9.3 11/21/2021   HGB 13.0 11/21/2021   HCT 37.5 (L) 11/21/2021   MCV 100.3 (H) 11/21/2021   PLT 103 (L) 11/21/2021    Lab Results  Component Value Date   CREATININE 1.33 (H) 11/21/2021   BUN 33 (H) 11/21/2021   NA 140 11/21/2021   K 4.8 11/21/2021   CL 108 11/21/2021   CO2 21 (L) 11/21/2021    Lab Results  Component Value Date   ALT 22 11/16/2021   AST 20 11/16/2021   ALKPHOS 63 11/16/2021   BILITOT 0.9 11/16/2021    Lab Results  Component Value Date   CHOL 137 06/12/2019   HDL 54 06/12/2019   LDLCALC 65 06/12/2019   TRIG 94 06/12/2019   CHOLHDL 2.5 06/12/2019   Lab Results  Component Value Date   LABRPR REACTIVE (A) 10/14/2020   RPRTITER 1:1 (H) 10/14/2020   HIV 1 RNA Quant  Date Value  10/14/2020 Not Detected Copies/mL  06/12/2019 <20 NOT DETECTED copies/mL  11/26/2017 <20 NOT DETECTED copies/mL   CD4 T Cell Abs (/uL)  Date Value  10/14/2020 775   06/12/2019 725  11/26/2017 980     Problem List Items Addressed This Visit       High   HIV disease ([place])    His HIV infection has been under excellent, long-term control.  His adherence with Biktarvy is perfect.  I will send in refills and update his lab work today.  He has a great deal of difficulty with transportation from his home in [place].  I will ask his PCP, Dr. Stana Bunting if he would be willing to check his CD4 count and HIV viral load annually and refill his Biktarvy.  As long as his viral load remains low and suppressed it would be okay for him to skip follow-up visits here in the future.      Relevant Medications   bictegravir-emtricitabine-tenofovir AF (BIKTARVY) 50-200-25 MG TABS tablet   Other Relevant Orders   T-helper cells (CD4) count (not at [facility])   HIV-1 RNA quant-no reflex-bld   CBC   Comprehensive metabolic panel   RPR   Late latent syphilis - Primary    His RPR had turned positive at a low titer of 1:1 in August 2022 and confirmed by positive FTA.  He has a history of anaphylaxis following penicillin years ago.  I treated him with 4 weeks of oral doxycycline.  Will get a repeat RPR today.      Relevant Medications   bictegravir-emtricitabine-tenofovir AF (BIKTARVY) 50-200-25 MG TABS tablet     Unprioritized   Acute respiratory failure with hypoxia ([place])    He  has noted increased pedal edema and worsening dyspnea on exertion over the past week since being off of his Lasix.  Him to check with his pharmacy today and he uses supplemental oxygen at home.         Michel Bickers, MD [facility] [phone] pager   [phone] cell 04/19/2022, 11:38 AM

## 2022-04-19 NOTE — Assessment & Plan Note (Signed)
He has noted increased pedal edema and worsening dyspnea on exertion over the past week since being off of his Lasix.  Him to check with his pharmacy today and he uses supplemental oxygen at home.

## 2022-04-19 NOTE — Assessment & Plan Note (Signed)
His RPR had turned positive at a low titer of 1:1 in August 2022 and confirmed by positive FTA.  He has a history of anaphylaxis following penicillin years ago.  I treated him with 4 weeks of oral doxycycline.  Will get a repeat RPR today.

## 2022-04-19 NOTE — Assessment & Plan Note (Signed)
His HIV infection has been under excellent, long-term control.  His adherence with Biktarvy is perfect.  I will send in refills and update his lab work today.  He has a great deal of difficulty with transportation from his home in [place].  I will ask his PCP, Dr. Stana Bunting if he would be willing to check his CD4 count and HIV viral load annually and refill his Biktarvy.  As long as his viral load remains low and suppressed it would be okay for him to skip follow-up visits here in the future.

## 2022-04-20 DIAGNOSIS — I10 Essential (primary) hypertension: Secondary | ICD-10-CM | POA: Diagnosis not present

## 2022-04-20 DIAGNOSIS — J441 Chronic obstructive pulmonary disease with (acute) exacerbation: Secondary | ICD-10-CM | POA: Diagnosis not present

## 2022-04-20 LAB — T-HELPER CELLS (CD4) COUNT (NOT AT ARMC)
CD4 % Helper T Cell: 44 % (ref 33–65)
CD4 T Cell Abs: 741 /uL (ref 400–1790)

## 2022-04-21 LAB — COMPREHENSIVE METABOLIC PANEL
AG Ratio: 1.2 (calc) (ref 1.0–2.5)
ALT: 21 U/L (ref 9–46)
AST: 42 U/L — ABNORMAL HIGH (ref 10–35)
Albumin: 3.6 g/dL (ref 3.6–5.1)
Alkaline phosphatase (APISO): 142 U/L (ref 35–144)
BUN: 19 mg/dL (ref 7–25)
CO2: 26 mmol/L (ref 20–32)
Calcium: 8.9 mg/dL (ref 8.6–10.3)
Chloride: 108 mmol/L (ref 98–110)
Creat: 1.28 mg/dL (ref 0.70–1.28)
Globulin: 3 g/dL (calc) (ref 1.9–3.7)
Glucose, Bld: 111 mg/dL — ABNORMAL HIGH (ref 65–99)
Potassium: 4.2 mmol/L (ref 3.5–5.3)
Sodium: 142 mmol/L (ref 135–146)
Total Bilirubin: 0.7 mg/dL (ref 0.2–1.2)
Total Protein: 6.6 g/dL (ref 6.1–8.1)

## 2022-04-21 LAB — CBC
HCT: 49.9 % (ref 38.5–50.0)
Hemoglobin: 16.4 g/dL (ref 13.2–17.1)
MCH: 31.9 pg (ref 27.0–33.0)
MCHC: 32.9 g/dL (ref 32.0–36.0)
MCV: 97.1 fL (ref 80.0–100.0)
MPV: 10.6 fL (ref 7.5–12.5)
Platelets: 154 10*3/uL (ref 140–400)
RBC: 5.14 10*6/uL (ref 4.20–5.80)
RDW: 15 % (ref 11.0–15.0)
WBC: 7 10*3/uL (ref 3.8–10.8)

## 2022-04-21 LAB — HIV-1 RNA QUANT-NO REFLEX-BLD
HIV 1 RNA Quant: NOT DETECTED Copies/mL
HIV-1 RNA Quant, Log: NOT DETECTED Log cps/mL

## 2022-04-21 LAB — RPR: RPR Ser Ql: NONREACTIVE

## 2022-04-27 ENCOUNTER — Encounter: Payer: Self-pay | Admitting: Radiology

## 2022-05-03 DIAGNOSIS — L03116 Cellulitis of left lower limb: Secondary | ICD-10-CM | POA: Diagnosis not present

## 2022-05-03 DIAGNOSIS — J439 Emphysema, unspecified: Secondary | ICD-10-CM | POA: Diagnosis not present

## 2022-05-03 DIAGNOSIS — R0602 Shortness of breath: Secondary | ICD-10-CM | POA: Diagnosis not present

## 2022-05-03 DIAGNOSIS — J189 Pneumonia, unspecified organism: Secondary | ICD-10-CM | POA: Diagnosis not present

## 2022-05-03 DIAGNOSIS — I5033 Acute on chronic diastolic (congestive) heart failure: Secondary | ICD-10-CM | POA: Diagnosis not present

## 2022-05-03 DIAGNOSIS — J9611 Chronic respiratory failure with hypoxia: Secondary | ICD-10-CM | POA: Diagnosis not present

## 2022-05-03 DIAGNOSIS — I130 Hypertensive heart and chronic kidney disease with heart failure and stage 1 through stage 4 chronic kidney disease, or unspecified chronic kidney disease: Secondary | ICD-10-CM | POA: Diagnosis not present

## 2022-05-03 DIAGNOSIS — N183 Chronic kidney disease, stage 3 unspecified: Secondary | ICD-10-CM | POA: Diagnosis not present

## 2022-05-03 DIAGNOSIS — L03115 Cellulitis of right lower limb: Secondary | ICD-10-CM | POA: Diagnosis not present

## 2022-05-03 DIAGNOSIS — M7989 Other specified soft tissue disorders: Secondary | ICD-10-CM | POA: Diagnosis not present

## 2022-05-03 DIAGNOSIS — B953 Streptococcus pneumoniae as the cause of diseases classified elsewhere: Secondary | ICD-10-CM | POA: Diagnosis not present

## 2022-05-03 DIAGNOSIS — F17210 Nicotine dependence, cigarettes, uncomplicated: Secondary | ICD-10-CM | POA: Diagnosis not present

## 2022-05-03 DIAGNOSIS — J90 Pleural effusion, not elsewhere classified: Secondary | ICD-10-CM | POA: Diagnosis not present

## 2022-05-03 DIAGNOSIS — Z21 Asymptomatic human immunodeficiency virus [HIV] infection status: Secondary | ICD-10-CM | POA: Diagnosis not present

## 2022-05-03 DIAGNOSIS — R609 Edema, unspecified: Secondary | ICD-10-CM | POA: Diagnosis not present

## 2022-05-03 DIAGNOSIS — M25551 Pain in right hip: Secondary | ICD-10-CM | POA: Diagnosis not present

## 2022-05-03 DIAGNOSIS — R918 Other nonspecific abnormal finding of lung field: Secondary | ICD-10-CM | POA: Diagnosis not present

## 2022-05-03 DIAGNOSIS — I517 Cardiomegaly: Secondary | ICD-10-CM | POA: Diagnosis not present

## 2022-05-03 DIAGNOSIS — B20 Human immunodeficiency virus [HIV] disease: Secondary | ICD-10-CM | POA: Diagnosis not present

## 2022-05-03 DIAGNOSIS — M79606 Pain in leg, unspecified: Secondary | ICD-10-CM | POA: Diagnosis not present

## 2022-05-03 DIAGNOSIS — R600 Localized edema: Secondary | ICD-10-CM | POA: Diagnosis not present

## 2022-05-03 DIAGNOSIS — I110 Hypertensive heart disease with heart failure: Secondary | ICD-10-CM | POA: Diagnosis not present

## 2022-05-03 DIAGNOSIS — L97519 Non-pressure chronic ulcer of other part of right foot with unspecified severity: Secondary | ICD-10-CM | POA: Diagnosis not present

## 2022-05-03 DIAGNOSIS — I272 Pulmonary hypertension, unspecified: Secondary | ICD-10-CM | POA: Diagnosis not present

## 2022-05-03 DIAGNOSIS — I2510 Atherosclerotic heart disease of native coronary artery without angina pectoris: Secondary | ICD-10-CM | POA: Diagnosis not present

## 2022-05-03 DIAGNOSIS — R0902 Hypoxemia: Secondary | ICD-10-CM | POA: Diagnosis not present

## 2022-05-03 DIAGNOSIS — L97529 Non-pressure chronic ulcer of other part of left foot with unspecified severity: Secondary | ICD-10-CM | POA: Diagnosis not present

## 2022-05-03 DIAGNOSIS — E785 Hyperlipidemia, unspecified: Secondary | ICD-10-CM | POA: Diagnosis not present

## 2022-05-03 DIAGNOSIS — I10 Essential (primary) hypertension: Secondary | ICD-10-CM | POA: Diagnosis not present

## 2022-05-12 DIAGNOSIS — N183 Chronic kidney disease, stage 3 unspecified: Secondary | ICD-10-CM | POA: Diagnosis not present

## 2022-05-12 DIAGNOSIS — J441 Chronic obstructive pulmonary disease with (acute) exacerbation: Secondary | ICD-10-CM | POA: Diagnosis not present

## 2022-05-12 DIAGNOSIS — I5033 Acute on chronic diastolic (congestive) heart failure: Secondary | ICD-10-CM | POA: Diagnosis not present

## 2022-05-12 DIAGNOSIS — J9621 Acute and chronic respiratory failure with hypoxia: Secondary | ICD-10-CM | POA: Diagnosis not present

## 2022-05-12 DIAGNOSIS — E1122 Type 2 diabetes mellitus with diabetic chronic kidney disease: Secondary | ICD-10-CM | POA: Diagnosis not present

## 2022-05-12 DIAGNOSIS — B20 Human immunodeficiency virus [HIV] disease: Secondary | ICD-10-CM | POA: Diagnosis not present

## 2022-05-12 DIAGNOSIS — I130 Hypertensive heart and chronic kidney disease with heart failure and stage 1 through stage 4 chronic kidney disease, or unspecified chronic kidney disease: Secondary | ICD-10-CM | POA: Diagnosis not present

## 2022-05-12 DIAGNOSIS — M5416 Radiculopathy, lumbar region: Secondary | ICD-10-CM | POA: Diagnosis not present

## 2022-05-12 DIAGNOSIS — E1151 Type 2 diabetes mellitus with diabetic peripheral angiopathy without gangrene: Secondary | ICD-10-CM | POA: Diagnosis not present

## 2022-05-16 ENCOUNTER — Other Ambulatory Visit: Payer: Self-pay | Admitting: Cardiology

## 2022-05-16 NOTE — Telephone Encounter (Signed)
Need clarification on carvedilol dose. Recent [facility] D/C has 25 mg BID

## 2022-05-19 DIAGNOSIS — I130 Hypertensive heart and chronic kidney disease with heart failure and stage 1 through stage 4 chronic kidney disease, or unspecified chronic kidney disease: Secondary | ICD-10-CM | POA: Diagnosis not present

## 2022-05-19 DIAGNOSIS — B20 Human immunodeficiency virus [HIV] disease: Secondary | ICD-10-CM | POA: Diagnosis not present

## 2022-05-19 DIAGNOSIS — I5033 Acute on chronic diastolic (congestive) heart failure: Secondary | ICD-10-CM | POA: Diagnosis not present

## 2022-05-19 DIAGNOSIS — J9621 Acute and chronic respiratory failure with hypoxia: Secondary | ICD-10-CM | POA: Diagnosis not present

## 2022-05-19 DIAGNOSIS — N183 Chronic kidney disease, stage 3 unspecified: Secondary | ICD-10-CM | POA: Diagnosis not present

## 2022-05-19 DIAGNOSIS — E1151 Type 2 diabetes mellitus with diabetic peripheral angiopathy without gangrene: Secondary | ICD-10-CM | POA: Diagnosis not present

## 2022-05-19 DIAGNOSIS — J441 Chronic obstructive pulmonary disease with (acute) exacerbation: Secondary | ICD-10-CM | POA: Diagnosis not present

## 2022-05-19 DIAGNOSIS — E1122 Type 2 diabetes mellitus with diabetic chronic kidney disease: Secondary | ICD-10-CM | POA: Diagnosis not present

## 2022-05-19 DIAGNOSIS — M5416 Radiculopathy, lumbar region: Secondary | ICD-10-CM | POA: Diagnosis not present

## 2022-05-24 DIAGNOSIS — I130 Hypertensive heart and chronic kidney disease with heart failure and stage 1 through stage 4 chronic kidney disease, or unspecified chronic kidney disease: Secondary | ICD-10-CM | POA: Diagnosis not present

## 2022-05-24 DIAGNOSIS — J441 Chronic obstructive pulmonary disease with (acute) exacerbation: Secondary | ICD-10-CM | POA: Diagnosis not present

## 2022-05-24 DIAGNOSIS — E1151 Type 2 diabetes mellitus with diabetic peripheral angiopathy without gangrene: Secondary | ICD-10-CM | POA: Diagnosis not present

## 2022-05-24 DIAGNOSIS — B20 Human immunodeficiency virus [HIV] disease: Secondary | ICD-10-CM | POA: Diagnosis not present

## 2022-05-24 DIAGNOSIS — N183 Chronic kidney disease, stage 3 unspecified: Secondary | ICD-10-CM | POA: Diagnosis not present

## 2022-05-24 DIAGNOSIS — E1122 Type 2 diabetes mellitus with diabetic chronic kidney disease: Secondary | ICD-10-CM | POA: Diagnosis not present

## 2022-05-24 DIAGNOSIS — J9621 Acute and chronic respiratory failure with hypoxia: Secondary | ICD-10-CM | POA: Diagnosis not present

## 2022-05-24 DIAGNOSIS — I5033 Acute on chronic diastolic (congestive) heart failure: Secondary | ICD-10-CM | POA: Diagnosis not present

## 2022-05-24 DIAGNOSIS — M5416 Radiculopathy, lumbar region: Secondary | ICD-10-CM | POA: Diagnosis not present

## 2022-05-26 DIAGNOSIS — J441 Chronic obstructive pulmonary disease with (acute) exacerbation: Secondary | ICD-10-CM | POA: Diagnosis not present

## 2022-05-26 DIAGNOSIS — E1151 Type 2 diabetes mellitus with diabetic peripheral angiopathy without gangrene: Secondary | ICD-10-CM | POA: Diagnosis not present

## 2022-05-26 DIAGNOSIS — J9621 Acute and chronic respiratory failure with hypoxia: Secondary | ICD-10-CM | POA: Diagnosis not present

## 2022-05-26 DIAGNOSIS — I130 Hypertensive heart and chronic kidney disease with heart failure and stage 1 through stage 4 chronic kidney disease, or unspecified chronic kidney disease: Secondary | ICD-10-CM | POA: Diagnosis not present

## 2022-05-26 DIAGNOSIS — E1122 Type 2 diabetes mellitus with diabetic chronic kidney disease: Secondary | ICD-10-CM | POA: Diagnosis not present

## 2022-05-26 DIAGNOSIS — I5033 Acute on chronic diastolic (congestive) heart failure: Secondary | ICD-10-CM | POA: Diagnosis not present

## 2022-05-26 DIAGNOSIS — B20 Human immunodeficiency virus [HIV] disease: Secondary | ICD-10-CM | POA: Diagnosis not present

## 2022-05-26 DIAGNOSIS — M5416 Radiculopathy, lumbar region: Secondary | ICD-10-CM | POA: Diagnosis not present

## 2022-05-26 DIAGNOSIS — N183 Chronic kidney disease, stage 3 unspecified: Secondary | ICD-10-CM | POA: Diagnosis not present

## 2022-05-29 DIAGNOSIS — I5033 Acute on chronic diastolic (congestive) heart failure: Secondary | ICD-10-CM | POA: Diagnosis not present

## 2022-05-29 DIAGNOSIS — M5416 Radiculopathy, lumbar region: Secondary | ICD-10-CM | POA: Diagnosis not present

## 2022-05-29 DIAGNOSIS — B20 Human immunodeficiency virus [HIV] disease: Secondary | ICD-10-CM | POA: Diagnosis not present

## 2022-05-29 DIAGNOSIS — N183 Chronic kidney disease, stage 3 unspecified: Secondary | ICD-10-CM | POA: Diagnosis not present

## 2022-05-29 DIAGNOSIS — E1122 Type 2 diabetes mellitus with diabetic chronic kidney disease: Secondary | ICD-10-CM | POA: Diagnosis not present

## 2022-05-29 DIAGNOSIS — E1151 Type 2 diabetes mellitus with diabetic peripheral angiopathy without gangrene: Secondary | ICD-10-CM | POA: Diagnosis not present

## 2022-05-29 DIAGNOSIS — J441 Chronic obstructive pulmonary disease with (acute) exacerbation: Secondary | ICD-10-CM | POA: Diagnosis not present

## 2022-05-29 DIAGNOSIS — I130 Hypertensive heart and chronic kidney disease with heart failure and stage 1 through stage 4 chronic kidney disease, or unspecified chronic kidney disease: Secondary | ICD-10-CM | POA: Diagnosis not present

## 2022-05-29 DIAGNOSIS — J9621 Acute and chronic respiratory failure with hypoxia: Secondary | ICD-10-CM | POA: Diagnosis not present

## 2022-05-31 DIAGNOSIS — I130 Hypertensive heart and chronic kidney disease with heart failure and stage 1 through stage 4 chronic kidney disease, or unspecified chronic kidney disease: Secondary | ICD-10-CM | POA: Diagnosis not present

## 2022-05-31 DIAGNOSIS — M5416 Radiculopathy, lumbar region: Secondary | ICD-10-CM | POA: Diagnosis not present

## 2022-05-31 DIAGNOSIS — J441 Chronic obstructive pulmonary disease with (acute) exacerbation: Secondary | ICD-10-CM | POA: Diagnosis not present

## 2022-05-31 DIAGNOSIS — J9621 Acute and chronic respiratory failure with hypoxia: Secondary | ICD-10-CM | POA: Diagnosis not present

## 2022-05-31 DIAGNOSIS — E1151 Type 2 diabetes mellitus with diabetic peripheral angiopathy without gangrene: Secondary | ICD-10-CM | POA: Diagnosis not present

## 2022-05-31 DIAGNOSIS — N183 Chronic kidney disease, stage 3 unspecified: Secondary | ICD-10-CM | POA: Diagnosis not present

## 2022-05-31 DIAGNOSIS — I5033 Acute on chronic diastolic (congestive) heart failure: Secondary | ICD-10-CM | POA: Diagnosis not present

## 2022-05-31 DIAGNOSIS — E1122 Type 2 diabetes mellitus with diabetic chronic kidney disease: Secondary | ICD-10-CM | POA: Diagnosis not present

## 2022-05-31 DIAGNOSIS — B20 Human immunodeficiency virus [HIV] disease: Secondary | ICD-10-CM | POA: Diagnosis not present

## 2022-06-04 DIAGNOSIS — I5033 Acute on chronic diastolic (congestive) heart failure: Secondary | ICD-10-CM | POA: Diagnosis not present

## 2022-06-04 DIAGNOSIS — M5416 Radiculopathy, lumbar region: Secondary | ICD-10-CM | POA: Diagnosis not present

## 2022-06-04 DIAGNOSIS — I130 Hypertensive heart and chronic kidney disease with heart failure and stage 1 through stage 4 chronic kidney disease, or unspecified chronic kidney disease: Secondary | ICD-10-CM | POA: Diagnosis not present

## 2022-06-04 DIAGNOSIS — E1151 Type 2 diabetes mellitus with diabetic peripheral angiopathy without gangrene: Secondary | ICD-10-CM | POA: Diagnosis not present

## 2022-06-04 DIAGNOSIS — E1122 Type 2 diabetes mellitus with diabetic chronic kidney disease: Secondary | ICD-10-CM | POA: Diagnosis not present

## 2022-06-04 DIAGNOSIS — N183 Chronic kidney disease, stage 3 unspecified: Secondary | ICD-10-CM | POA: Diagnosis not present

## 2022-06-04 DIAGNOSIS — J441 Chronic obstructive pulmonary disease with (acute) exacerbation: Secondary | ICD-10-CM | POA: Diagnosis not present

## 2022-06-04 DIAGNOSIS — B20 Human immunodeficiency virus [HIV] disease: Secondary | ICD-10-CM | POA: Diagnosis not present

## 2022-06-05 DIAGNOSIS — J9610 Chronic respiratory failure, unspecified whether with hypoxia or hypercapnia: Secondary | ICD-10-CM | POA: Diagnosis not present

## 2022-06-05 DIAGNOSIS — Z720 Tobacco use: Secondary | ICD-10-CM | POA: Diagnosis not present

## 2022-06-05 DIAGNOSIS — I739 Peripheral vascular disease, unspecified: Secondary | ICD-10-CM | POA: Diagnosis not present

## 2022-06-05 DIAGNOSIS — I5032 Chronic diastolic (congestive) heart failure: Secondary | ICD-10-CM | POA: Diagnosis not present

## 2022-06-05 DIAGNOSIS — M25551 Pain in right hip: Secondary | ICD-10-CM | POA: Diagnosis not present

## 2022-06-05 DIAGNOSIS — Z515 Encounter for palliative care: Secondary | ICD-10-CM | POA: Diagnosis not present

## 2022-06-05 DIAGNOSIS — J449 Chronic obstructive pulmonary disease, unspecified: Secondary | ICD-10-CM | POA: Diagnosis not present

## 2022-06-06 DIAGNOSIS — I5033 Acute on chronic diastolic (congestive) heart failure: Secondary | ICD-10-CM | POA: Diagnosis not present

## 2022-06-06 DIAGNOSIS — N183 Chronic kidney disease, stage 3 unspecified: Secondary | ICD-10-CM | POA: Diagnosis not present

## 2022-06-06 DIAGNOSIS — E1151 Type 2 diabetes mellitus with diabetic peripheral angiopathy without gangrene: Secondary | ICD-10-CM | POA: Diagnosis not present

## 2022-06-06 DIAGNOSIS — M5416 Radiculopathy, lumbar region: Secondary | ICD-10-CM | POA: Diagnosis not present

## 2022-06-06 DIAGNOSIS — E1122 Type 2 diabetes mellitus with diabetic chronic kidney disease: Secondary | ICD-10-CM | POA: Diagnosis not present

## 2022-06-06 DIAGNOSIS — I130 Hypertensive heart and chronic kidney disease with heart failure and stage 1 through stage 4 chronic kidney disease, or unspecified chronic kidney disease: Secondary | ICD-10-CM | POA: Diagnosis not present

## 2022-06-06 DIAGNOSIS — J441 Chronic obstructive pulmonary disease with (acute) exacerbation: Secondary | ICD-10-CM | POA: Diagnosis not present

## 2022-06-06 DIAGNOSIS — B20 Human immunodeficiency virus [HIV] disease: Secondary | ICD-10-CM | POA: Diagnosis not present

## 2022-06-06 DIAGNOSIS — J9621 Acute and chronic respiratory failure with hypoxia: Secondary | ICD-10-CM | POA: Diagnosis not present

## 2022-06-08 DIAGNOSIS — J439 Emphysema, unspecified: Secondary | ICD-10-CM | POA: Diagnosis not present

## 2022-06-08 DIAGNOSIS — Z720 Tobacco use: Secondary | ICD-10-CM | POA: Diagnosis not present

## 2022-06-08 DIAGNOSIS — F17210 Nicotine dependence, cigarettes, uncomplicated: Secondary | ICD-10-CM | POA: Diagnosis not present

## 2022-06-08 DIAGNOSIS — Z79899 Other long term (current) drug therapy: Secondary | ICD-10-CM | POA: Diagnosis not present

## 2022-06-08 DIAGNOSIS — Z792 Long term (current) use of antibiotics: Secondary | ICD-10-CM | POA: Diagnosis not present

## 2022-06-08 DIAGNOSIS — I5030 Unspecified diastolic (congestive) heart failure: Secondary | ICD-10-CM | POA: Diagnosis not present

## 2022-06-08 DIAGNOSIS — I110 Hypertensive heart disease with heart failure: Secondary | ICD-10-CM | POA: Diagnosis not present

## 2022-06-08 DIAGNOSIS — J189 Pneumonia, unspecified organism: Secondary | ICD-10-CM | POA: Diagnosis not present

## 2022-06-08 DIAGNOSIS — I10 Essential (primary) hypertension: Secondary | ICD-10-CM | POA: Diagnosis not present

## 2022-06-08 DIAGNOSIS — J441 Chronic obstructive pulmonary disease with (acute) exacerbation: Secondary | ICD-10-CM | POA: Diagnosis not present

## 2022-06-08 DIAGNOSIS — R531 Weakness: Secondary | ICD-10-CM | POA: Diagnosis not present

## 2022-06-08 DIAGNOSIS — I5033 Acute on chronic diastolic (congestive) heart failure: Secondary | ICD-10-CM | POA: Diagnosis not present

## 2022-06-08 DIAGNOSIS — I5032 Chronic diastolic (congestive) heart failure: Secondary | ICD-10-CM | POA: Diagnosis not present

## 2022-06-08 DIAGNOSIS — Z1152 Encounter for screening for COVID-19: Secondary | ICD-10-CM | POA: Diagnosis not present

## 2022-06-08 DIAGNOSIS — Z9981 Dependence on supplemental oxygen: Secondary | ICD-10-CM | POA: Diagnosis not present

## 2022-06-08 DIAGNOSIS — E7800 Pure hypercholesterolemia, unspecified: Secondary | ICD-10-CM | POA: Diagnosis not present

## 2022-06-08 DIAGNOSIS — R7989 Other specified abnormal findings of blood chemistry: Secondary | ICD-10-CM | POA: Diagnosis not present

## 2022-06-08 DIAGNOSIS — J90 Pleural effusion, not elsewhere classified: Secondary | ICD-10-CM | POA: Diagnosis not present

## 2022-06-08 DIAGNOSIS — I2510 Atherosclerotic heart disease of native coronary artery without angina pectoris: Secondary | ICD-10-CM | POA: Diagnosis not present

## 2022-06-08 DIAGNOSIS — B20 Human immunodeficiency virus [HIV] disease: Secondary | ICD-10-CM | POA: Diagnosis not present

## 2022-06-08 DIAGNOSIS — R0902 Hypoxemia: Secondary | ICD-10-CM | POA: Diagnosis not present

## 2022-06-08 DIAGNOSIS — R0600 Dyspnea, unspecified: Secondary | ICD-10-CM | POA: Diagnosis not present

## 2022-06-08 DIAGNOSIS — R42 Dizziness and giddiness: Secondary | ICD-10-CM | POA: Diagnosis not present

## 2022-06-08 DIAGNOSIS — R457 State of emotional shock and stress, unspecified: Secondary | ICD-10-CM | POA: Diagnosis not present

## 2022-06-08 DIAGNOSIS — M549 Dorsalgia, unspecified: Secondary | ICD-10-CM | POA: Diagnosis not present

## 2022-06-11 DIAGNOSIS — J441 Chronic obstructive pulmonary disease with (acute) exacerbation: Secondary | ICD-10-CM | POA: Diagnosis not present

## 2022-06-11 DIAGNOSIS — E1151 Type 2 diabetes mellitus with diabetic peripheral angiopathy without gangrene: Secondary | ICD-10-CM | POA: Diagnosis not present

## 2022-06-11 DIAGNOSIS — J9621 Acute and chronic respiratory failure with hypoxia: Secondary | ICD-10-CM | POA: Diagnosis not present

## 2022-06-11 DIAGNOSIS — E1122 Type 2 diabetes mellitus with diabetic chronic kidney disease: Secondary | ICD-10-CM | POA: Diagnosis not present

## 2022-06-11 DIAGNOSIS — B20 Human immunodeficiency virus [HIV] disease: Secondary | ICD-10-CM | POA: Diagnosis not present

## 2022-06-11 DIAGNOSIS — I5033 Acute on chronic diastolic (congestive) heart failure: Secondary | ICD-10-CM | POA: Diagnosis not present

## 2022-06-11 DIAGNOSIS — I130 Hypertensive heart and chronic kidney disease with heart failure and stage 1 through stage 4 chronic kidney disease, or unspecified chronic kidney disease: Secondary | ICD-10-CM | POA: Diagnosis not present

## 2022-06-11 DIAGNOSIS — M5416 Radiculopathy, lumbar region: Secondary | ICD-10-CM | POA: Diagnosis not present

## 2022-06-11 DIAGNOSIS — N183 Chronic kidney disease, stage 3 unspecified: Secondary | ICD-10-CM | POA: Diagnosis not present

## 2022-06-15 DIAGNOSIS — J9621 Acute and chronic respiratory failure with hypoxia: Secondary | ICD-10-CM | POA: Diagnosis not present

## 2022-06-15 DIAGNOSIS — N183 Chronic kidney disease, stage 3 unspecified: Secondary | ICD-10-CM | POA: Diagnosis not present

## 2022-06-15 DIAGNOSIS — E1151 Type 2 diabetes mellitus with diabetic peripheral angiopathy without gangrene: Secondary | ICD-10-CM | POA: Diagnosis not present

## 2022-06-15 DIAGNOSIS — I5033 Acute on chronic diastolic (congestive) heart failure: Secondary | ICD-10-CM | POA: Diagnosis not present

## 2022-06-15 DIAGNOSIS — B20 Human immunodeficiency virus [HIV] disease: Secondary | ICD-10-CM | POA: Diagnosis not present

## 2022-06-15 DIAGNOSIS — I130 Hypertensive heart and chronic kidney disease with heart failure and stage 1 through stage 4 chronic kidney disease, or unspecified chronic kidney disease: Secondary | ICD-10-CM | POA: Diagnosis not present

## 2022-06-15 DIAGNOSIS — J441 Chronic obstructive pulmonary disease with (acute) exacerbation: Secondary | ICD-10-CM | POA: Diagnosis not present

## 2022-06-15 DIAGNOSIS — M5416 Radiculopathy, lumbar region: Secondary | ICD-10-CM | POA: Diagnosis not present

## 2022-06-15 DIAGNOSIS — E1122 Type 2 diabetes mellitus with diabetic chronic kidney disease: Secondary | ICD-10-CM | POA: Diagnosis not present

## 2022-06-16 DIAGNOSIS — J9621 Acute and chronic respiratory failure with hypoxia: Secondary | ICD-10-CM | POA: Diagnosis not present

## 2022-06-16 DIAGNOSIS — I130 Hypertensive heart and chronic kidney disease with heart failure and stage 1 through stage 4 chronic kidney disease, or unspecified chronic kidney disease: Secondary | ICD-10-CM | POA: Diagnosis not present

## 2022-06-16 DIAGNOSIS — J441 Chronic obstructive pulmonary disease with (acute) exacerbation: Secondary | ICD-10-CM | POA: Diagnosis not present

## 2022-06-16 DIAGNOSIS — I5033 Acute on chronic diastolic (congestive) heart failure: Secondary | ICD-10-CM | POA: Diagnosis not present

## 2022-06-16 DIAGNOSIS — N183 Chronic kidney disease, stage 3 unspecified: Secondary | ICD-10-CM | POA: Diagnosis not present

## 2022-06-16 DIAGNOSIS — M5416 Radiculopathy, lumbar region: Secondary | ICD-10-CM | POA: Diagnosis not present

## 2022-06-16 DIAGNOSIS — E1151 Type 2 diabetes mellitus with diabetic peripheral angiopathy without gangrene: Secondary | ICD-10-CM | POA: Diagnosis not present

## 2022-06-16 DIAGNOSIS — B20 Human immunodeficiency virus [HIV] disease: Secondary | ICD-10-CM | POA: Diagnosis not present

## 2022-06-16 DIAGNOSIS — E1122 Type 2 diabetes mellitus with diabetic chronic kidney disease: Secondary | ICD-10-CM | POA: Diagnosis not present

## 2022-06-28 DIAGNOSIS — Z6824 Body mass index (BMI) 24.0-24.9, adult: Secondary | ICD-10-CM | POA: Diagnosis not present

## 2022-06-28 DIAGNOSIS — R030 Elevated blood-pressure reading, without diagnosis of hypertension: Secondary | ICD-10-CM | POA: Diagnosis not present

## 2022-06-28 DIAGNOSIS — J449 Chronic obstructive pulmonary disease, unspecified: Secondary | ICD-10-CM | POA: Diagnosis not present

## 2022-06-28 DIAGNOSIS — I2510 Atherosclerotic heart disease of native coronary artery without angina pectoris: Secondary | ICD-10-CM | POA: Diagnosis not present

## 2022-06-28 DIAGNOSIS — D751 Secondary polycythemia: Secondary | ICD-10-CM | POA: Diagnosis not present

## 2022-06-28 DIAGNOSIS — M25559 Pain in unspecified hip: Secondary | ICD-10-CM | POA: Diagnosis not present

## 2022-06-28 DIAGNOSIS — F17210 Nicotine dependence, cigarettes, uncomplicated: Secondary | ICD-10-CM | POA: Diagnosis not present

## 2022-06-28 DIAGNOSIS — B20 Human immunodeficiency virus [HIV] disease: Secondary | ICD-10-CM | POA: Diagnosis not present

## 2022-06-28 DIAGNOSIS — Z8701 Personal history of pneumonia (recurrent): Secondary | ICD-10-CM | POA: Diagnosis not present

## 2022-07-10 ENCOUNTER — Other Ambulatory Visit (INDEPENDENT_AMBULATORY_CARE_PROVIDER_SITE_OTHER): Payer: Medicare HMO

## 2022-07-10 ENCOUNTER — Ambulatory Visit: Payer: Medicare HMO | Admitting: Physician Assistant

## 2022-07-10 ENCOUNTER — Encounter: Payer: Self-pay | Admitting: Physician Assistant

## 2022-07-10 DIAGNOSIS — Z96641 Presence of right artificial hip joint: Secondary | ICD-10-CM

## 2022-07-10 MED ORDER — METHYLPREDNISOLONE ACETATE 40 MG/ML IJ SUSP
40.0000 mg | INTRAMUSCULAR | Status: AC | PRN
Start: 2022-07-10 — End: 2022-07-10
  Administered 2022-07-10: 40 mg via INTRA_ARTICULAR

## 2022-07-10 MED ORDER — LIDOCAINE HCL 1 % IJ SOLN
3.0000 mL | INTRAMUSCULAR | Status: AC | PRN
Start: 2022-07-10 — End: 2022-07-10
  Administered 2022-07-10: 3 mL

## 2022-07-10 NOTE — Progress Notes (Signed)
   Procedure Note  Patient: Nicholas [name]             Date of Birth: [birth year]           MRN: [id number]             Visit Date: 07/10/2022 HPI: Nicholas [name] comes in today for right hip pain.  He states a month ago he fell.  He states he was walking and fell onto his right hip which she has had a right total hip arthroplasty on 11/18/2021.  He is having pain lateral aspect of the hip no groin pain. He is currently walking with a rollator.  He does report that he has been doing abduction exercises and other exercises as taught by therapy for his right hip.  Review of systems see HPI otherwise negative  Physical exam: Right hip good range of motion.  Tenderness over the right hip trochanteric region and down the right IT band.  Surgical incisions well-healed no signs of infection.  Radiographs: AP pelvis lateral view of the right hip: Right total hip is well-seated.  Hips well located.  No acute fractures or acute findings. Procedures: Visit Diagnoses:  1. Status post total replacement of right hip     Large Joint Inj: R greater trochanter on 07/10/2022 11:26 AM Indications: pain Details: 22 G 1.5 in needle, lateral approach  Arthrogram: No  Medications: 3 mL lidocaine 1 %; 40 mg methylPREDNISolone acetate 40 MG/ML Outcome: tolerated well, no immediate complications Procedure, treatment alternatives, risks and benefits explained, specific risks discussed. Consent was given by the patient. Immediately prior to procedure a time out was called to verify the correct patient, procedure, equipment, support staff and site/side marked as required. Patient was prepped and draped in the usual sterile fashion.    Plan: Discussed with him to stop abduction exercises of the right hip.  He is shown IT band stretching exercises.  He will follow-up with [place] at his scheduled appointment in September which is 1 year anniversary of his right total hip arthroplasty.  He follow-up sooner if there is any  questions concerns.  At follow-up in September like an AP pelvis.

## 2022-07-19 ENCOUNTER — Institutional Professional Consult (permissible substitution): Payer: Medicare HMO | Admitting: Internal Medicine

## 2022-07-20 DIAGNOSIS — Z720 Tobacco use: Secondary | ICD-10-CM | POA: Diagnosis not present

## 2022-07-20 DIAGNOSIS — R2243 Localized swelling, mass and lump, lower limb, bilateral: Secondary | ICD-10-CM | POA: Diagnosis not present

## 2022-07-20 DIAGNOSIS — I5032 Chronic diastolic (congestive) heart failure: Secondary | ICD-10-CM | POA: Diagnosis not present

## 2022-07-20 DIAGNOSIS — G8929 Other chronic pain: Secondary | ICD-10-CM | POA: Diagnosis not present

## 2022-07-20 DIAGNOSIS — J449 Chronic obstructive pulmonary disease, unspecified: Secondary | ICD-10-CM | POA: Diagnosis not present

## 2022-07-20 DIAGNOSIS — Z515 Encounter for palliative care: Secondary | ICD-10-CM | POA: Diagnosis not present

## 2022-07-20 DIAGNOSIS — I739 Peripheral vascular disease, unspecified: Secondary | ICD-10-CM | POA: Diagnosis not present

## 2022-07-20 DIAGNOSIS — J9610 Chronic respiratory failure, unspecified whether with hypoxia or hypercapnia: Secondary | ICD-10-CM | POA: Diagnosis not present

## 2022-08-14 ENCOUNTER — Other Ambulatory Visit: Payer: Self-pay | Admitting: Cardiology

## 2022-08-15 DIAGNOSIS — R7303 Prediabetes: Secondary | ICD-10-CM | POA: Diagnosis not present

## 2022-08-15 DIAGNOSIS — E039 Hypothyroidism, unspecified: Secondary | ICD-10-CM | POA: Diagnosis not present

## 2022-08-15 DIAGNOSIS — R972 Elevated prostate specific antigen [PSA]: Secondary | ICD-10-CM | POA: Diagnosis not present

## 2022-08-15 DIAGNOSIS — E559 Vitamin D deficiency, unspecified: Secondary | ICD-10-CM | POA: Diagnosis not present

## 2022-08-15 DIAGNOSIS — N189 Chronic kidney disease, unspecified: Secondary | ICD-10-CM | POA: Diagnosis not present

## 2022-08-15 DIAGNOSIS — D751 Secondary polycythemia: Secondary | ICD-10-CM | POA: Diagnosis not present

## 2022-08-15 DIAGNOSIS — E7801 Familial hypercholesterolemia: Secondary | ICD-10-CM | POA: Diagnosis not present

## 2022-08-20 DIAGNOSIS — J441 Chronic obstructive pulmonary disease with (acute) exacerbation: Secondary | ICD-10-CM | POA: Diagnosis not present

## 2022-08-20 DIAGNOSIS — I10 Essential (primary) hypertension: Secondary | ICD-10-CM | POA: Diagnosis not present

## 2022-08-21 DIAGNOSIS — B20 Human immunodeficiency virus [HIV] disease: Secondary | ICD-10-CM | POA: Diagnosis not present

## 2022-08-21 DIAGNOSIS — I2510 Atherosclerotic heart disease of native coronary artery without angina pectoris: Secondary | ICD-10-CM | POA: Diagnosis not present

## 2022-08-21 DIAGNOSIS — J189 Pneumonia, unspecified organism: Secondary | ICD-10-CM | POA: Diagnosis not present

## 2022-08-21 DIAGNOSIS — I10 Essential (primary) hypertension: Secondary | ICD-10-CM | POA: Diagnosis not present

## 2022-08-21 DIAGNOSIS — E559 Vitamin D deficiency, unspecified: Secondary | ICD-10-CM | POA: Diagnosis not present

## 2022-08-21 DIAGNOSIS — F17200 Nicotine dependence, unspecified, uncomplicated: Secondary | ICD-10-CM | POA: Diagnosis not present

## 2022-08-21 DIAGNOSIS — D751 Secondary polycythemia: Secondary | ICD-10-CM | POA: Diagnosis not present

## 2022-08-21 DIAGNOSIS — R972 Elevated prostate specific antigen [PSA]: Secondary | ICD-10-CM | POA: Diagnosis not present

## 2022-08-21 DIAGNOSIS — N189 Chronic kidney disease, unspecified: Secondary | ICD-10-CM | POA: Diagnosis not present

## 2022-08-21 NOTE — Progress Notes (Signed)
Einar Grad, male    DOB: Dec 23, 1950    MRN: [id number]   Brief patient profile:  72  yowm active smoker with HIV (undetectable / followed by Orvan Falconer)   referred to pulmonary clinic in [place]  08/23/2022 by Dr Mitzi Hansen  for copd      History of Present Illness  08/23/2022  Pulmonary/ 1st office eval/ Hydie Langan / [facility] stiolto 1 puff daily  then started trelegy 2 d prior to OV  and no change since  Chief Complaint  Patient presents with   Consult  Dyspnea:  rollator pace/ does hallway walking sev times per week x up to 20 min  Cough: varies daytime  > clear  no change x years  Sleep: no resp cc  SABA use: not even using  02: has it but not using > PCP working on POC  No obvious day to day or daytime pattern/variability or assoc excess/ purulent sputum or mucus plugs or hemoptysis or cp or chest tightness, subjective wheeze or overt sinus or hb symptoms.   Sleeping without nocturnal  or early am exacerbation  of respiratory  c/o's or need for noct saba. Also denies any obvious fluctuation of symptoms with weather or environmental changes or other aggravating or alleviating factors except as outlined above   No unusual exposure hx or h/o childhood pna/ asthma or knowledge of premature birth.  Current Allergies, Complete Past Medical History, Past Surgical History, Family History, and Social History were reviewed in [employer] record.  ROS  The following are not active complaints unless bolded Hoarseness, sore throat, dysphagia, dental problems, itching, sneezing,  nasal congestion or discharge of excess mucus or purulent secretions, ear ache,   fever, chills, sweats, unintended wt loss or wt gain, classically pleuritic or exertional cp,  orthopnea pnd or arm/hand swelling  or leg swelling, presyncope, palpitations, abdominal pain, anorexia, nausea, vomiting, diarrhea  or change in bowel habits or change in bladder habits, change in stools or change  in urine, dysuria, hematuria,  rash, arthralgias, visual complaints, headache, numbness, weakness or ataxia or problems with walking or coordination,  change in mood or  memory.              Outpatient Medications Prior to Visit  Medication Sig Dispense Refill   acetaminophen (TYLENOL) 325 MG tablet Take 325 mg by mouth daily as needed for headache.     aspirin EC 81 MG tablet Take 1 tablet (81 mg total) by mouth daily.     atorvastatin (LIPITOR) 20 MG tablet Take 1 tablet (20 mg total) by mouth daily. 90 tablet 3   bictegravir-emtricitabine-tenofovir AF (BIKTARVY) 50-200-25 MG TABS tablet Take 1 tablet by mouth daily. 30 tablet 11   carvedilol (COREG) 12.5 MG tablet TAKE 1 TABLET(12.5 MG) BY MOUTH TWICE DAILY WITH A MEAL. NEED AN OFFICE VISIT. 60 tablet 0   clopidogrel (PLAVIX) 75 MG tablet Take 1 tablet (75 mg total) by mouth daily. 90 tablet 3   nicotine (NICODERM CQ - DOSED IN MG/24 HOURS) 14 mg/24hr patch 14 mg daily.     STIOLTO RESPIMAT 2.5-2.5 MCG/ACT AERS Inhale 1 puff into the lungs daily.     VENTOLIN HFA 108 (90 Base) MCG/ACT inhaler Take 3 puffs by mouth as needed for shortness of breath or wheezing.  5   amLODipine (NORVASC) 10 MG tablet Take 10 mg by mouth daily.     furosemide (LASIX) 20 MG tablet Take 20 mg by mouth daily.  JARDIANCE 10 MG TABS tablet Take 10 mg by mouth daily. (Patient not taking: Reported on 04/19/2022)     losartan (COZAAR) 25 MG tablet Take 25 mg by mouth daily. (Patient not taking: Reported on 02/02/2022)     oxyCODONE (OXY IR/ROXICODONE) 5 MG immediate release tablet Take 1-2 tablets (5-10 mg total) by mouth every 4 (four) hours as needed for moderate pain (pain score 4-6). (Patient not taking: Reported on 08/23/2022) 30 tablet 0   TRELEGY ELLIPTA 200-62.5-25 MCG/ACT AEPB Inhale 1 puff into the lungs daily.     No facility-administered medications prior to visit.    Past Medical History:  Diagnosis Date   CAD (coronary artery disease)     Overlapping DES x2 to the mid to distal circumflex extending into OM 3 01/14/2018, DES x2 to the mid to distal RCA 01/16/2018   COPD (chronic obstructive pulmonary disease) (HCC)    Essential hypertension    Grover's disease 2017   HIV (human immunodeficiency virus infection) (HCC)    Polycythemia 2017   Seasonal allergies 2017   ST elevation myocardial infarction (STEMI) of inferior wall (HCC)    November 2019   Stroke (cerebrum) (HCC)    Vitamin D deficiency       Objective:     BP (!) 183/71   Pulse 71   Ht 5\' 10"  (1.778 m)   Wt 159 lb 3.2 oz (72.2 kg)   SpO2 (!) 88% Comment: RA  BMI 22.84 kg/m   SpO2: (!) 88 % (RA)  Amb elderly wm > 72 age / uses rollator  HEENT :  Oropharynx clear   Nasal turbinates nl    NECK :  without JVD/Nodes/TM/ nl carotid upstrokes bilaterally   LUNGS: no acc muscle use,  Mod barrel  contour chest wall with bilateral insp / exp rhonchi and  without cough on insp or exp maneuvers and mod  Hyperresonant  to  percussion bilaterally     CV:  RRR  no s3 or murmur or increase in P2, and no edema   ABD:  soft and nontender with pos mid insp Hoover's  in the supine position. No bruits or organomegaly appreciated, bowel sounds nl  MS:   Ext warm without deformities or   obvious joint restrictions , calf tenderness, cyanosis or clubbing  SKIN: warm and dry with severe venous stasis/ rubor both LE's   NEURO:  alert, approp, nl sensorium with  no motor or cerebellar deficits apparent.             Assessment   COPD GOLD ? / still smoking Active smoker  with ? Group B vs E risk/symptoms   His main concern is DOE and I  doubt he'll find much difference between Trelegy and stiolto but note he was only taking one puff of stiolto when he switched to Trelegy so rec:  Finish the sample of trelegy and then change back to stiolto for at least a week at 2 puffs each am   - The proper method of use, as well as anticipated side effects, of a  metered-dose inhaler were discussed and demonstrated to the patient using teach back method using an empty stiolto canister.  Rec cxr/alpha one AT phenotype 08/23/2022 but preferred to do this with PCP  Also rec PFTs but did not schedule yet as not clear he wants regular f/u here.     Chronic respiratory failure with hypoxia (HCC) 02 sats 88% on arrival on RA 08/23/2022   Rec:  Make sure you check your oxygen saturation  AT  your highest level of activity (not after you stop)   to be sure it stays over 90% and adjust  02 flow upward to maintain this level if needed but remember to turn it back to previous settings when you stop (to conserve your supply).   I understand PCP is supplying 02 and working on POC so pulmonary f/u for this problem and his copd is prn     Cigarette smoker 4-5 min discussion re active cigarette smoking in addition to office E&M  Ask about tobacco use:   ongoing Advise quitting   I took an extended  opportunity with this patient to outline the consequences of continued cigarette use  in airway disorders based on all the data we have from the multiple national lung health studies (perfomed over decades at millions of dollars in cost)  indicating that smoking cessation, not choice of inhalers or pulmonary physicians, is the most important aspect of his care.   Assess willingness:  Not committed at this point Assist in quit attempt:  Per PCP when ready Arrange follow up:   Follow up per Primary Care planned     Low-dose CT lung cancer screening is recommended for patients who are 83-19 years of age with a 20+ pack-year history of smoking and who are currently smoking or quit <=15 years ago. No coughing up blood  No unintentional weight loss of > 15 pounds in the last 6 months - pt is eligible for scanning yearly until age 59 > rec discuss with PCP at annual health eval.     Each maintenance medication was reviewed in detail including emphasizing most importantly the  difference between maintenance and prns and under what circumstances the prns are to be triggered using an action plan format where appropriate.  Total time for H and P, chart review, counseling, reviewing hfa/dpi/smi/02 device(s) and generating customized AVS unique to this office visit / same day charting > 30 min for multiple  chronic respiratory  issues/ new pt eval           Sandrea Hughs, MD 08/23/2022

## 2022-08-23 ENCOUNTER — Encounter: Payer: Self-pay | Admitting: Internal Medicine

## 2022-08-23 ENCOUNTER — Ambulatory Visit: Payer: Medicare HMO | Admitting: Internal Medicine

## 2022-08-23 VITALS — BP 183/71 | HR 71 | Ht 70.0 in | Wt 159.2 lb

## 2022-08-23 DIAGNOSIS — J449 Chronic obstructive pulmonary disease, unspecified: Secondary | ICD-10-CM | POA: Diagnosis not present

## 2022-08-23 DIAGNOSIS — F17210 Nicotine dependence, cigarettes, uncomplicated: Secondary | ICD-10-CM | POA: Diagnosis not present

## 2022-08-23 DIAGNOSIS — J9611 Chronic respiratory failure with hypoxia: Secondary | ICD-10-CM | POA: Diagnosis not present

## 2022-08-23 NOTE — Patient Instructions (Signed)
Either trelegy 100 one click each am or Stiolto 2 puffs 1st thing in am like high octane fuel to see if either one works better for better   Make sure you check your oxygen saturation at your highest level of activity(NOT after you stop)  to be sure it stays over 90% and keep track of it at least once a week, more often if breathing getting worse, and let me know if losing ground. (Collect the dots to connect the dots approach)    The key is to stop smoking completely before smoking completely stops you!  You will also need a follow up cxr and alpha one phenotype ([facility] [id number])      Pulmonary follow up is as needed but you will need to bring all your medications.

## 2022-08-24 DIAGNOSIS — J9611 Chronic respiratory failure with hypoxia: Secondary | ICD-10-CM | POA: Insufficient documentation

## 2022-08-24 DIAGNOSIS — J449 Chronic obstructive pulmonary disease, unspecified: Secondary | ICD-10-CM | POA: Insufficient documentation

## 2022-08-24 NOTE — Assessment & Plan Note (Addendum)
Active smoker  with ? Group B vs E risk/symptoms   His main concern is DOE and I  doubt he'll find much difference between Trelegy and stiolto but note he was only taking one puff of stiolto when he switched to Trelegy so rec:  Finish the sample of trelegy and then change back to stiolto for at least a week at 2 puffs each am   - The proper method of use, as well as anticipated side effects, of a metered-dose inhaler were discussed and demonstrated to the patient using teach back method using an empty stiolto canister.  Rec cxr/alpha one AT phenotype 08/23/2022 but preferred to do this with PCP  Also rec PFTs but did not schedule yet as not clear he wants regular f/u here.

## 2022-08-24 NOTE — Assessment & Plan Note (Addendum)
02 sats 88% on arrival on RA 08/23/2022   Rec: Make sure you check your oxygen saturation  AT  your highest level of activity (not after you stop)   to be sure it stays over 90% and adjust  02 flow upward to maintain this level if needed but remember to turn it back to previous settings when you stop (to conserve your supply).   I understand PCP is supplying 02 and working on POC so pulmonary f/u for this problem and his copd is prn         Each maintenance medication was reviewed in detail including emphasizing most importantly the difference between maintenance and prns and under what circumstances the prns are to be triggered using an action plan format where appropriate.  Total time for H and P, chart review, counseling, reviewing hfa/dpi/smi/02 device(s) and generating customized AVS unique to this office visit / same day charting > 30 min for multiple  chronic respiratory  issues/ new pt eval

## 2022-08-24 NOTE — Assessment & Plan Note (Signed)
4-5 min discussion re active cigarette smoking in addition to office E&M  Ask about tobacco use:   ongoing Advise quitting   I took an extended  opportunity with this patient to outline the consequences of continued cigarette use  in airway disorders based on all the data we have from the multiple national lung health studies (perfomed over decades at millions of dollars in cost)  indicating that smoking cessation, not choice of inhalers or pulmonary physicians, is the most important aspect of his care.   Assess willingness:  Not committed at this point Assist in quit attempt:  Per PCP when ready Arrange follow up:   Follow up per Primary Care planned     Low-dose CT lung cancer screening is recommended for patients who are 54-50 years of age with a 20+ pack-year history of smoking and who are currently smoking or quit <=15 years ago. No coughing up blood  No unintentional weight loss of > 15 pounds in the last 6 months - pt is eligible for scanning yearly until age 32 > rec discuss with PCP at annual health eval.

## 2022-08-28 DIAGNOSIS — M545 Low back pain, unspecified: Secondary | ICD-10-CM | POA: Diagnosis not present

## 2022-08-28 DIAGNOSIS — G894 Chronic pain syndrome: Secondary | ICD-10-CM | POA: Diagnosis not present

## 2022-08-28 DIAGNOSIS — Z79891 Long term (current) use of opiate analgesic: Secondary | ICD-10-CM | POA: Diagnosis not present

## 2022-08-28 DIAGNOSIS — M25551 Pain in right hip: Secondary | ICD-10-CM | POA: Diagnosis not present

## 2022-08-28 DIAGNOSIS — M5116 Intervertebral disc disorders with radiculopathy, lumbar region: Secondary | ICD-10-CM | POA: Diagnosis not present

## 2022-08-28 DIAGNOSIS — Z79899 Other long term (current) drug therapy: Secondary | ICD-10-CM | POA: Diagnosis not present

## 2022-08-31 DIAGNOSIS — I739 Peripheral vascular disease, unspecified: Secondary | ICD-10-CM | POA: Diagnosis not present

## 2022-08-31 DIAGNOSIS — J9610 Chronic respiratory failure, unspecified whether with hypoxia or hypercapnia: Secondary | ICD-10-CM | POA: Diagnosis not present

## 2022-08-31 DIAGNOSIS — I5032 Chronic diastolic (congestive) heart failure: Secondary | ICD-10-CM | POA: Diagnosis not present

## 2022-08-31 DIAGNOSIS — J449 Chronic obstructive pulmonary disease, unspecified: Secondary | ICD-10-CM | POA: Diagnosis not present

## 2022-08-31 DIAGNOSIS — Z515 Encounter for palliative care: Secondary | ICD-10-CM | POA: Diagnosis not present

## 2022-08-31 DIAGNOSIS — Z720 Tobacco use: Secondary | ICD-10-CM | POA: Diagnosis not present

## 2022-09-04 DIAGNOSIS — N189 Chronic kidney disease, unspecified: Secondary | ICD-10-CM | POA: Diagnosis not present

## 2022-09-04 DIAGNOSIS — I10 Essential (primary) hypertension: Secondary | ICD-10-CM | POA: Diagnosis not present

## 2022-09-11 DIAGNOSIS — I10 Essential (primary) hypertension: Secondary | ICD-10-CM | POA: Diagnosis not present

## 2022-09-11 DIAGNOSIS — N189 Chronic kidney disease, unspecified: Secondary | ICD-10-CM | POA: Diagnosis not present

## 2022-09-12 DIAGNOSIS — B351 Tinea unguium: Secondary | ICD-10-CM | POA: Diagnosis not present

## 2022-09-12 DIAGNOSIS — M79676 Pain in unspecified toe(s): Secondary | ICD-10-CM | POA: Diagnosis not present

## 2022-09-19 ENCOUNTER — Other Ambulatory Visit: Payer: Self-pay | Admitting: Cardiology

## 2022-09-19 ENCOUNTER — Telehealth: Payer: Self-pay | Admitting: Cardiology

## 2022-09-19 NOTE — Telephone Encounter (Signed)
Approve large quantity of coreg, pt has apt in sept with Dr.McDowell in September

## 2022-09-19 NOTE — Telephone Encounter (Signed)
Pt c/o medication issue:  1. Name of Medication:  carvedilol (COREG) 12.5 MG tablet  2. How are you currently taking this medication (dosage and times per day)?   3. Are you having a reaction (difficulty breathing--STAT)?   4. What is your medication issue?   Mandy with [facility] would like to discuss the dose of Carvedilol sent the pharmacy. She would like to know if the quantity can be increased because it will cost them extra for shipping to continue sending out small quantities of the medication. Please advise.

## 2022-09-20 DIAGNOSIS — J441 Chronic obstructive pulmonary disease with (acute) exacerbation: Secondary | ICD-10-CM | POA: Diagnosis not present

## 2022-09-20 DIAGNOSIS — I10 Essential (primary) hypertension: Secondary | ICD-10-CM | POA: Diagnosis not present

## 2022-09-25 DIAGNOSIS — G894 Chronic pain syndrome: Secondary | ICD-10-CM | POA: Diagnosis not present

## 2022-09-25 DIAGNOSIS — M5116 Intervertebral disc disorders with radiculopathy, lumbar region: Secondary | ICD-10-CM | POA: Diagnosis not present

## 2022-09-25 DIAGNOSIS — Z79899 Other long term (current) drug therapy: Secondary | ICD-10-CM | POA: Diagnosis not present

## 2022-09-25 DIAGNOSIS — M25551 Pain in right hip: Secondary | ICD-10-CM | POA: Diagnosis not present

## 2022-09-25 DIAGNOSIS — Z79891 Long term (current) use of opiate analgesic: Secondary | ICD-10-CM | POA: Diagnosis not present

## 2022-09-25 DIAGNOSIS — M545 Low back pain, unspecified: Secondary | ICD-10-CM | POA: Diagnosis not present

## 2022-10-02 DIAGNOSIS — D751 Secondary polycythemia: Secondary | ICD-10-CM | POA: Diagnosis not present

## 2022-10-02 DIAGNOSIS — E559 Vitamin D deficiency, unspecified: Secondary | ICD-10-CM | POA: Diagnosis not present

## 2022-10-02 DIAGNOSIS — I10 Essential (primary) hypertension: Secondary | ICD-10-CM | POA: Diagnosis not present

## 2022-10-02 DIAGNOSIS — B20 Human immunodeficiency virus [HIV] disease: Secondary | ICD-10-CM | POA: Diagnosis not present

## 2022-10-02 DIAGNOSIS — R972 Elevated prostate specific antigen [PSA]: Secondary | ICD-10-CM | POA: Diagnosis not present

## 2022-10-02 DIAGNOSIS — I2510 Atherosclerotic heart disease of native coronary artery without angina pectoris: Secondary | ICD-10-CM | POA: Diagnosis not present

## 2022-10-02 DIAGNOSIS — F17200 Nicotine dependence, unspecified, uncomplicated: Secondary | ICD-10-CM | POA: Diagnosis not present

## 2022-10-02 DIAGNOSIS — N189 Chronic kidney disease, unspecified: Secondary | ICD-10-CM | POA: Diagnosis not present

## 2022-10-02 DIAGNOSIS — J449 Chronic obstructive pulmonary disease, unspecified: Secondary | ICD-10-CM | POA: Diagnosis not present

## 2022-10-05 DIAGNOSIS — D751 Secondary polycythemia: Secondary | ICD-10-CM | POA: Diagnosis not present

## 2022-10-05 DIAGNOSIS — R972 Elevated prostate specific antigen [PSA]: Secondary | ICD-10-CM | POA: Diagnosis not present

## 2022-10-05 DIAGNOSIS — N189 Chronic kidney disease, unspecified: Secondary | ICD-10-CM | POA: Diagnosis not present

## 2022-10-05 DIAGNOSIS — F17200 Nicotine dependence, unspecified, uncomplicated: Secondary | ICD-10-CM | POA: Diagnosis not present

## 2022-10-05 DIAGNOSIS — B20 Human immunodeficiency virus [HIV] disease: Secondary | ICD-10-CM | POA: Diagnosis not present

## 2022-10-05 DIAGNOSIS — J449 Chronic obstructive pulmonary disease, unspecified: Secondary | ICD-10-CM | POA: Diagnosis not present

## 2022-10-05 DIAGNOSIS — I2510 Atherosclerotic heart disease of native coronary artery without angina pectoris: Secondary | ICD-10-CM | POA: Diagnosis not present

## 2022-10-05 DIAGNOSIS — E559 Vitamin D deficiency, unspecified: Secondary | ICD-10-CM | POA: Diagnosis not present

## 2022-10-05 DIAGNOSIS — I10 Essential (primary) hypertension: Secondary | ICD-10-CM | POA: Diagnosis not present

## 2022-10-09 DIAGNOSIS — R972 Elevated prostate specific antigen [PSA]: Secondary | ICD-10-CM | POA: Diagnosis not present

## 2022-10-09 DIAGNOSIS — E559 Vitamin D deficiency, unspecified: Secondary | ICD-10-CM | POA: Diagnosis not present

## 2022-10-09 DIAGNOSIS — N189 Chronic kidney disease, unspecified: Secondary | ICD-10-CM | POA: Diagnosis not present

## 2022-10-09 DIAGNOSIS — D751 Secondary polycythemia: Secondary | ICD-10-CM | POA: Diagnosis not present

## 2022-10-09 DIAGNOSIS — F17200 Nicotine dependence, unspecified, uncomplicated: Secondary | ICD-10-CM | POA: Diagnosis not present

## 2022-10-09 DIAGNOSIS — B20 Human immunodeficiency virus [HIV] disease: Secondary | ICD-10-CM | POA: Diagnosis not present

## 2022-10-09 DIAGNOSIS — I2510 Atherosclerotic heart disease of native coronary artery without angina pectoris: Secondary | ICD-10-CM | POA: Diagnosis not present

## 2022-10-09 DIAGNOSIS — J449 Chronic obstructive pulmonary disease, unspecified: Secondary | ICD-10-CM | POA: Diagnosis not present

## 2022-10-09 DIAGNOSIS — I10 Essential (primary) hypertension: Secondary | ICD-10-CM | POA: Diagnosis not present

## 2022-10-11 DIAGNOSIS — M7121 Synovial cyst of popliteal space [Baker], right knee: Secondary | ICD-10-CM | POA: Diagnosis not present

## 2022-10-11 DIAGNOSIS — M79605 Pain in left leg: Secondary | ICD-10-CM | POA: Diagnosis not present

## 2022-10-11 DIAGNOSIS — M7122 Synovial cyst of popliteal space [Baker], left knee: Secondary | ICD-10-CM | POA: Diagnosis not present

## 2022-10-11 DIAGNOSIS — R600 Localized edema: Secondary | ICD-10-CM | POA: Diagnosis not present

## 2022-10-16 DIAGNOSIS — I2510 Atherosclerotic heart disease of native coronary artery without angina pectoris: Secondary | ICD-10-CM | POA: Diagnosis not present

## 2022-10-16 DIAGNOSIS — I10 Essential (primary) hypertension: Secondary | ICD-10-CM | POA: Diagnosis not present

## 2022-10-16 DIAGNOSIS — F17200 Nicotine dependence, unspecified, uncomplicated: Secondary | ICD-10-CM | POA: Diagnosis not present

## 2022-10-16 DIAGNOSIS — N189 Chronic kidney disease, unspecified: Secondary | ICD-10-CM | POA: Diagnosis not present

## 2022-10-16 DIAGNOSIS — J449 Chronic obstructive pulmonary disease, unspecified: Secondary | ICD-10-CM | POA: Diagnosis not present

## 2022-10-16 DIAGNOSIS — R972 Elevated prostate specific antigen [PSA]: Secondary | ICD-10-CM | POA: Diagnosis not present

## 2022-10-16 DIAGNOSIS — B20 Human immunodeficiency virus [HIV] disease: Secondary | ICD-10-CM | POA: Diagnosis not present

## 2022-10-16 DIAGNOSIS — D751 Secondary polycythemia: Secondary | ICD-10-CM | POA: Diagnosis not present

## 2022-10-16 DIAGNOSIS — E559 Vitamin D deficiency, unspecified: Secondary | ICD-10-CM | POA: Diagnosis not present

## 2022-10-20 DIAGNOSIS — Z515 Encounter for palliative care: Secondary | ICD-10-CM | POA: Diagnosis not present

## 2022-10-20 DIAGNOSIS — J449 Chronic obstructive pulmonary disease, unspecified: Secondary | ICD-10-CM | POA: Diagnosis not present

## 2022-10-20 DIAGNOSIS — I5032 Chronic diastolic (congestive) heart failure: Secondary | ICD-10-CM | POA: Diagnosis not present

## 2022-11-15 ENCOUNTER — Encounter: Payer: Self-pay | Admitting: *Deleted

## 2022-11-15 NOTE — Progress Notes (Deleted)
Cardiology Office Note  Date: 11/15/2022   ID: Nicholas [name], DOB November 17, 1950, MRN [id number]  History of Present Illness: Nicholas [name] is a 72 y.o. male last seen in May 2023.  Physical Exam: VS:  There were no vitals taken for this visit., BMI There is no height or weight on file to calculate BMI.  Wt Readings from Last 3 Encounters:  08/23/22 159 lb 3.2 oz (72.2 kg)  04/19/22 187 lb 12.8 oz (85.2 kg)  11/17/21 200 lb 9.9 oz (91 kg)    General: Patient appears comfortable at rest. HEENT: Conjunctiva and lids normal, oropharynx clear with moist mucosa. Neck: Supple, no elevated JVP or carotid bruits, no thyromegaly. Lungs: Clear to auscultation, nonlabored breathing at rest. Cardiac: Regular rate and rhythm, no S3 or significant systolic murmur, no pericardial rub. Abdomen: Soft, nontender, no hepatomegaly, bowel sounds present, no guarding or rebound. Extremities: No pitting edema, distal pulses 2+. Skin: Warm and dry. Musculoskeletal: No kyphosis. Neuropsychiatric: Alert and oriented x3, affect grossly appropriate.  ECG:  An ECG dated 11/16/2021 was personally reviewed today and demonstrated:  Sinus rhythm.  Labwork: March 2023: Cholesterol 137, triglycerides 102, HDL 49, LDL 69 11/16/2021: B Natriuretic Peptide 313.9 04/19/2022: ALT 21; AST 42; BUN 19; Creat 1.28; Hemoglobin 16.4; Platelets 154; Potassium 4.2; Sodium 142  April 2024: Hemoglobin 15.7, platelets 138, potassium 4.2, BUN 27, creatinine 1.02  Other Studies Reviewed Today:  Echocardiogram 11/17/2021:  1. Challenging windows. Left ventricular ejection fraction, by  estimation, is 60 to 65%. The left ventricle has normal function. The left  ventricle has no regional wall motion abnormalities. Left ventricular  diastolic parameters are consistent with  Grade I diastolic dysfunction (impaired relaxation).   2. Right ventricular systolic function is normal. The right ventricular  size is normal.   3. The mitral  valve is grossly normal. No evidence of mitral valve  regurgitation.   4. The aortic valve was not well visualized. Aortic valve regurgitation  is not visualized.   5. The inferior vena cava is normal in size with greater than 50%  respiratory variability, suggesting right atrial pressure of 3 mmHg.   Assessment and Plan:  1.  CAD status post DES x 2 to the mid to distal circumflex extending into OM 3 as well as DES x 2 to the mid to distal RCA in November 2019.  2.  Essential hypertension.  3.  Mixed hyperlipidemia.  LDL 69 in March of last year.  4.  HIV disease.  He continues to follow with ID on Biktarvy.  Disposition:  Follow up {follow up:15908}  Signed, Jonelle Sidle, M.D., F.A.C.C. [facility]

## 2022-11-16 ENCOUNTER — Ambulatory Visit: Payer: Medicare HMO | Admitting: Cardiology

## 2022-11-16 DIAGNOSIS — I25119 Atherosclerotic heart disease of native coronary artery with unspecified angina pectoris: Secondary | ICD-10-CM

## 2022-11-21 DEATH — deceased

## 2023-04-09 IMAGING — MR MR LUMBAR SPINE W/O CM
5 series · 32 of 48 positions shown · non-contrast
Comparison: None.

CLINICAL DATA: Chronic back pain with right leg pain

EXAM:
MRI LUMBAR SPINE WITHOUT CONTRAST
TECHNIQUE: Multiplanar, multisequence MR imaging of the lumbar spine was
performed. No intravenous contrast was administered.

[Series 9: T2 · sagittal · 4.0mm · 0.73mm/px · 7 of 13 slices shown (1 of 2)]
[im 1/13]
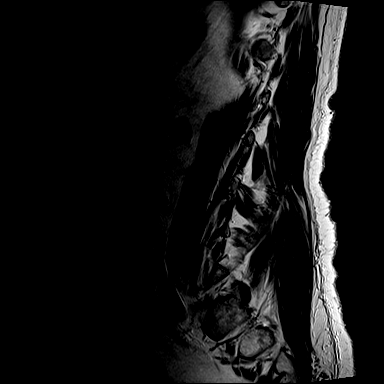
[im 3/13]
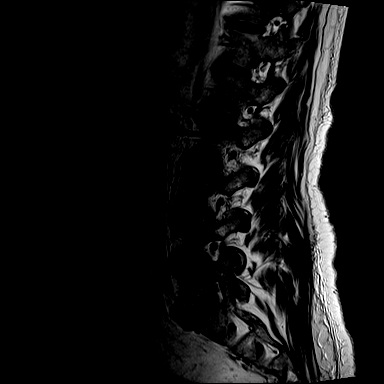
[im 5/13]
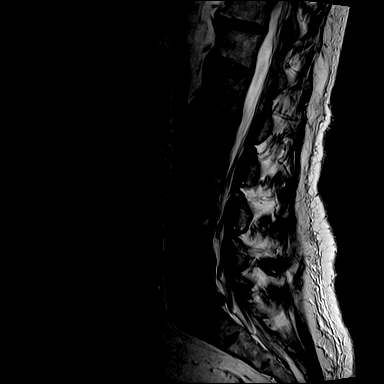
[im 7/13]
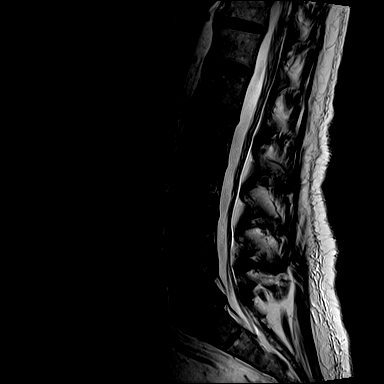
[im 9/13]
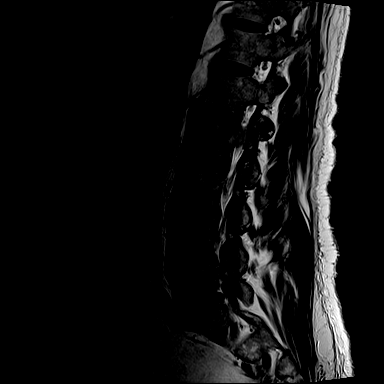
[im 11/13]
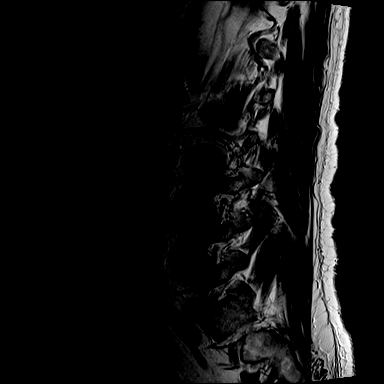
[im 13/13]
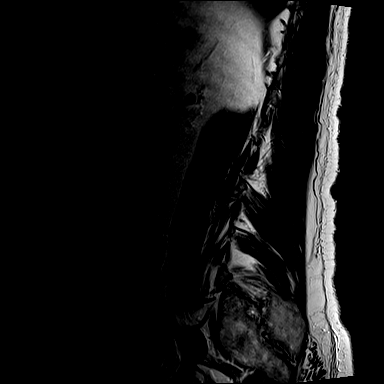

[Series 10: T1 · sagittal · 4.0mm · 1.09mm/px · 6 of 13 slices shown (1 of 2)]
[im 1/13]
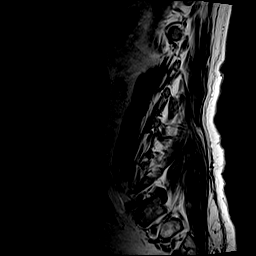
[im 3/13]
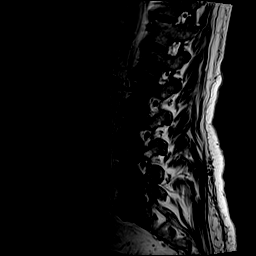
[im 5/13]
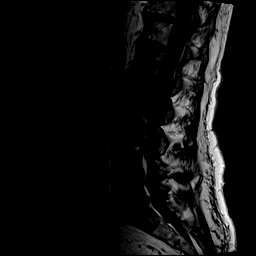
[im 8/13]
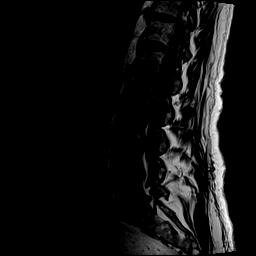
[im 10/13]
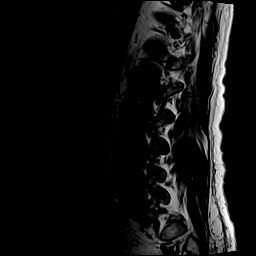
[im 13/13]
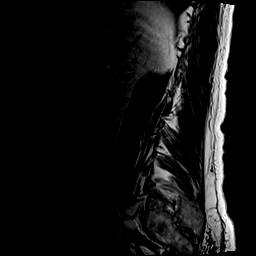

[Series 11: STIR · sagittal · 4.0mm · 0.55mm/px · 3 of 15 slices shown]
[im 1/15]
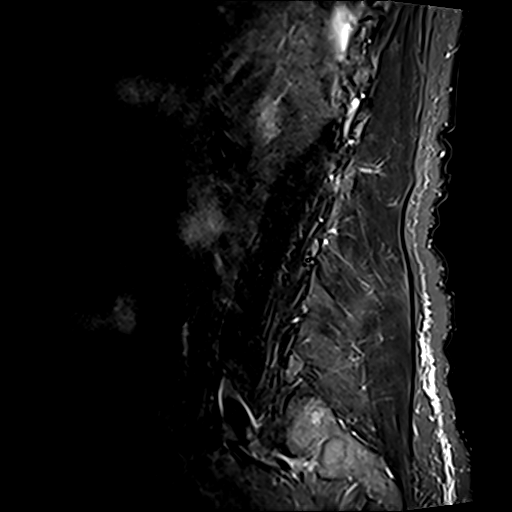
[im 3/15]
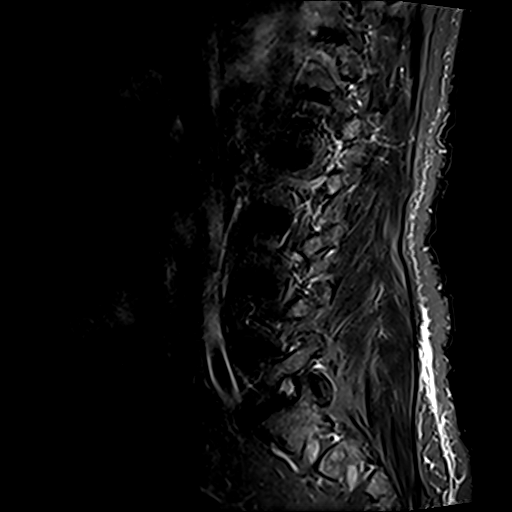
[im 5/15]
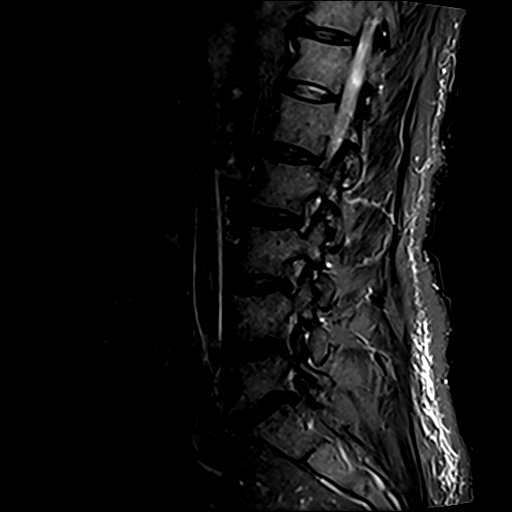

[Series 12: T2 · axial · 4.0mm · 0.70mm/px · z∈[-269,-28]mm · 8 of 31 slices shown (2 of 2)]
[im 1/31]
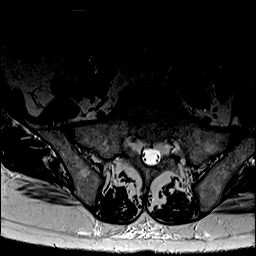
[im 5/31]
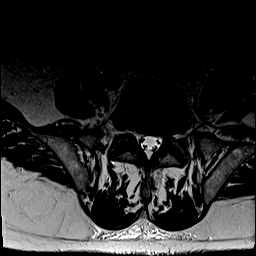
[im 10/31]
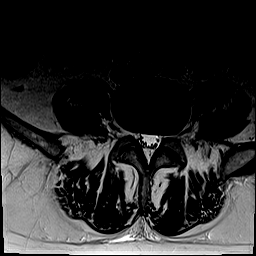
[im 14/31]
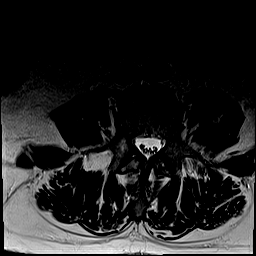
[im 17/31]
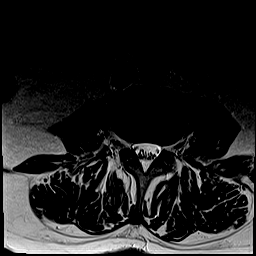
[im 21/31]
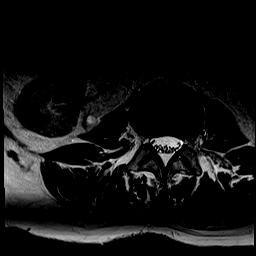
[im 26/31]
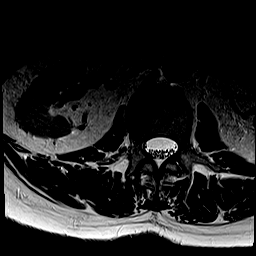
[im 31/31]
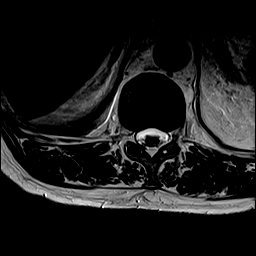

[Series 13: T1 · axial · 4.0mm · 0.35mm/px · z∈[-269,-28]mm · 8 of 31 slices shown (2 of 2)]
[im 1/31]
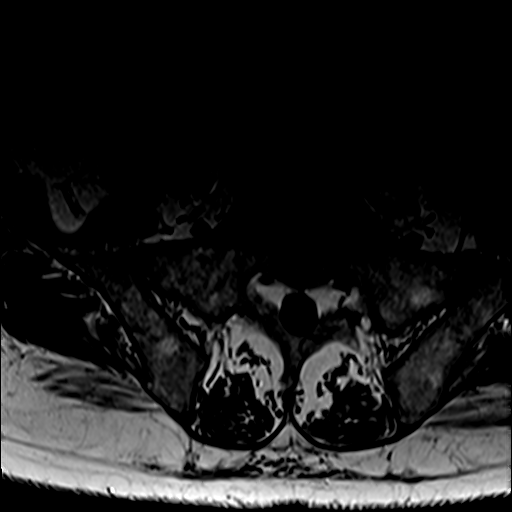
[im 5/31]
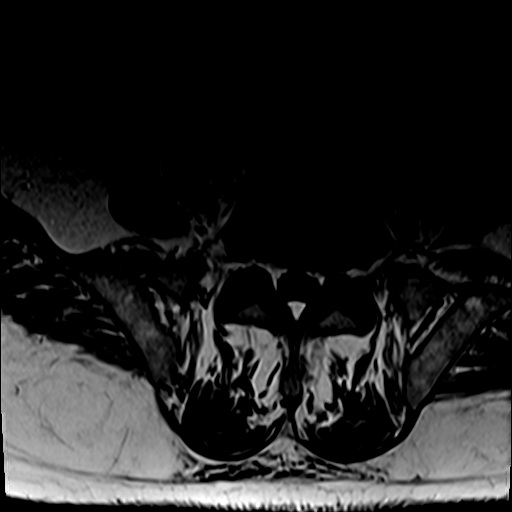
[im 10/31]
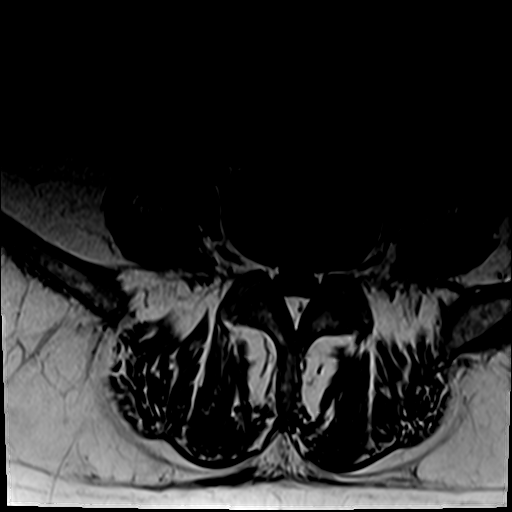
[im 14/31]
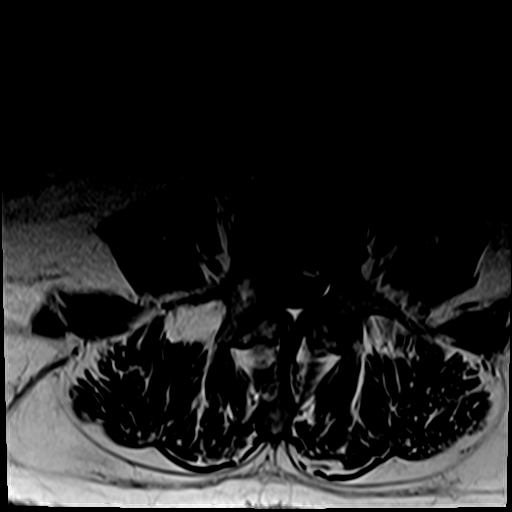
[im 17/31]
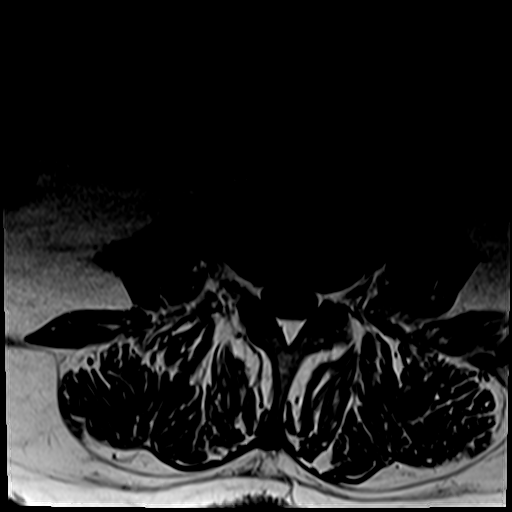
[im 21/31]
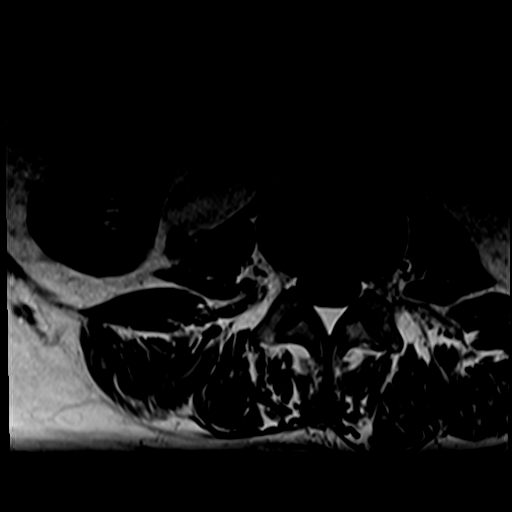
[im 26/31]
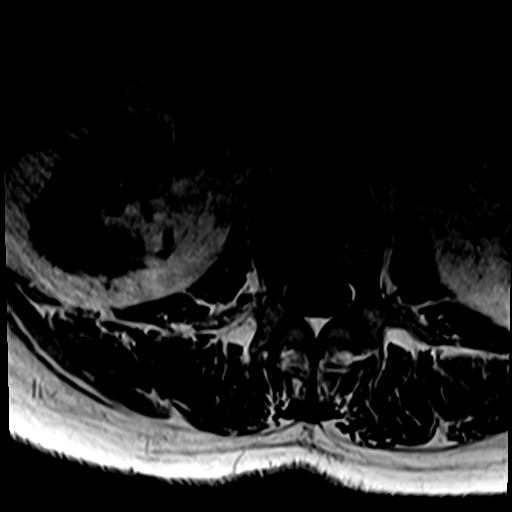
[im 31/31]
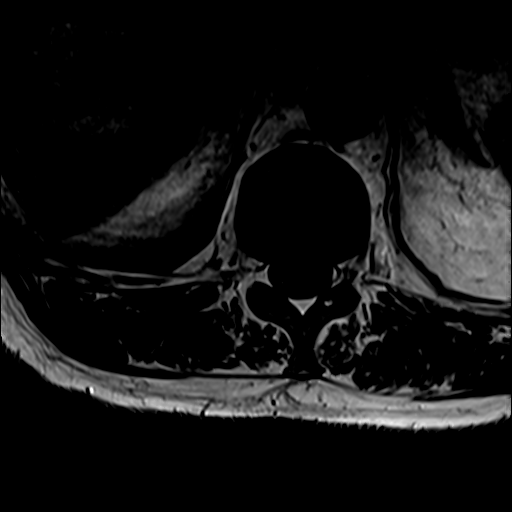

[32 of 48 positions shown; findings below may reference images not displayed]

FINDINGS: Segmentation:  Normal

Alignment:  Normal

Vertebrae:  Normal bone marrow.  Negative for fracture or mass

Conus medullaris and cauda equina: Conus extends to the L1 level.
Conus and cauda equina appear normal.

Paraspinal and other soft tissues: Negative for paraspinous mass or
adenopathy

Disc levels:

L1-2: Mild disc bulging.  Negative for stenosis

L2-3: Mild disc bulging.  Negative for stenosis

L3-4: Mild disc bulging.  Negative for stenosis

L4-5 mild disc bulging.  Negative for stenosis

L5-S1: Disc degeneration with disc bulging and endplate spurring.
Mild facet degeneration. Mild subarticular stenosis on the left
could cause impingement of the left S1 nerve root.
IMPRESSION: Mild lumbar degenerative change

Mild left subarticular stenosis L5-S1. Possible left L5 nerve root
impingement.

## 2023-04-19 ENCOUNTER — Ambulatory Visit: Payer: Medicare HMO | Admitting: Internal Medicine
